# Patient Record
Sex: Male | Born: 1940 | Race: Black or African American | Hispanic: No | Smoking: Former smoker
Health system: Southern US, Community
[De-identification: ages and names within clinical notes are randomized; demographics above are authoritative.]

## PROBLEM LIST (undated history)

## (undated) DIAGNOSIS — S88119A Complete traumatic amputation at level between knee and ankle, unspecified lower leg, initial encounter: Secondary | ICD-10-CM

## (undated) DIAGNOSIS — E7800 Pure hypercholesterolemia, unspecified: Secondary | ICD-10-CM

## (undated) DIAGNOSIS — I639 Cerebral infarction, unspecified: Secondary | ICD-10-CM

## (undated) DIAGNOSIS — I10 Essential (primary) hypertension: Secondary | ICD-10-CM

## (undated) DIAGNOSIS — E669 Obesity, unspecified: Secondary | ICD-10-CM

## (undated) DIAGNOSIS — I2510 Atherosclerotic heart disease of native coronary artery without angina pectoris: Secondary | ICD-10-CM

## (undated) DIAGNOSIS — I739 Peripheral vascular disease, unspecified: Secondary | ICD-10-CM

## (undated) HISTORY — PX: AMPUTATION: SHX166

---

## 2001-02-22 ENCOUNTER — Encounter: Payer: Self-pay | Admitting: Internal Medicine

## 2001-02-22 ENCOUNTER — Inpatient Hospital Stay (HOSPITAL_COMMUNITY): Admission: EM | Admit: 2001-02-22 | Discharge: 2001-02-24 | Payer: Self-pay | Admitting: Emergency Medicine

## 2001-02-22 ENCOUNTER — Encounter: Payer: Self-pay | Admitting: Emergency Medicine

## 2001-02-23 ENCOUNTER — Encounter: Payer: Self-pay | Admitting: Internal Medicine

## 2003-07-18 ENCOUNTER — Emergency Department (HOSPITAL_COMMUNITY): Admission: EM | Admit: 2003-07-18 | Discharge: 2003-07-18 | Payer: Self-pay | Admitting: Emergency Medicine

## 2003-08-14 ENCOUNTER — Encounter (HOSPITAL_COMMUNITY): Admission: RE | Admit: 2003-08-14 | Discharge: 2003-08-15 | Payer: Self-pay | Admitting: Internal Medicine

## 2003-12-19 ENCOUNTER — Ambulatory Visit (HOSPITAL_COMMUNITY): Admission: RE | Admit: 2003-12-19 | Discharge: 2003-12-19 | Payer: Self-pay | Admitting: General Surgery

## 2004-03-23 ENCOUNTER — Emergency Department (HOSPITAL_COMMUNITY): Admission: EM | Admit: 2004-03-23 | Discharge: 2004-03-23 | Payer: Self-pay | Admitting: Emergency Medicine

## 2004-04-02 ENCOUNTER — Observation Stay (HOSPITAL_COMMUNITY): Admission: RE | Admit: 2004-04-02 | Discharge: 2004-04-03 | Payer: Self-pay | Admitting: Orthopedic Surgery

## 2004-04-02 ENCOUNTER — Ambulatory Visit: Payer: Self-pay | Admitting: Orthopedic Surgery

## 2004-04-10 ENCOUNTER — Ambulatory Visit: Payer: Self-pay | Admitting: Orthopedic Surgery

## 2004-04-14 ENCOUNTER — Ambulatory Visit: Payer: Self-pay | Admitting: Orthopedic Surgery

## 2004-05-12 ENCOUNTER — Ambulatory Visit: Payer: Self-pay | Admitting: Orthopedic Surgery

## 2004-05-12 ENCOUNTER — Inpatient Hospital Stay (HOSPITAL_COMMUNITY): Admission: AD | Admit: 2004-05-12 | Discharge: 2004-05-21 | Payer: Self-pay | Admitting: Orthopedic Surgery

## 2004-06-11 ENCOUNTER — Ambulatory Visit: Payer: Self-pay | Admitting: Orthopedic Surgery

## 2004-06-18 ENCOUNTER — Ambulatory Visit: Payer: Self-pay | Admitting: Orthopedic Surgery

## 2004-06-25 ENCOUNTER — Ambulatory Visit: Payer: Self-pay | Admitting: Orthopedic Surgery

## 2004-07-09 ENCOUNTER — Ambulatory Visit: Payer: Self-pay | Admitting: Orthopedic Surgery

## 2004-07-23 ENCOUNTER — Ambulatory Visit: Payer: Self-pay | Admitting: Orthopedic Surgery

## 2004-08-06 ENCOUNTER — Ambulatory Visit: Payer: Self-pay | Admitting: Orthopedic Surgery

## 2004-08-11 ENCOUNTER — Ambulatory Visit (HOSPITAL_COMMUNITY): Admission: RE | Admit: 2004-08-11 | Discharge: 2004-08-11 | Payer: Self-pay | Admitting: Orthopedic Surgery

## 2004-08-20 ENCOUNTER — Ambulatory Visit: Payer: Self-pay | Admitting: Orthopedic Surgery

## 2004-08-20 ENCOUNTER — Inpatient Hospital Stay (HOSPITAL_COMMUNITY): Admission: AD | Admit: 2004-08-20 | Discharge: 2004-08-27 | Payer: Self-pay | Admitting: Orthopedic Surgery

## 2004-08-27 ENCOUNTER — Ambulatory Visit: Payer: Self-pay | Admitting: Physical Medicine & Rehabilitation

## 2004-08-27 ENCOUNTER — Inpatient Hospital Stay (HOSPITAL_COMMUNITY)
Admission: RE | Admit: 2004-08-27 | Discharge: 2004-09-04 | Payer: Self-pay | Admitting: Physical Medicine & Rehabilitation

## 2004-09-25 ENCOUNTER — Emergency Department (HOSPITAL_COMMUNITY): Admission: EM | Admit: 2004-09-25 | Discharge: 2004-09-25 | Payer: Self-pay | Admitting: Emergency Medicine

## 2004-10-13 ENCOUNTER — Encounter
Admission: RE | Admit: 2004-10-13 | Discharge: 2005-01-11 | Payer: Self-pay | Admitting: Physical Medicine & Rehabilitation

## 2004-10-15 ENCOUNTER — Encounter
Admission: RE | Admit: 2004-10-15 | Discharge: 2004-10-15 | Payer: Self-pay | Admitting: Physical Medicine & Rehabilitation

## 2004-10-15 ENCOUNTER — Ambulatory Visit: Payer: Self-pay | Admitting: Physical Medicine & Rehabilitation

## 2004-12-09 ENCOUNTER — Encounter
Admission: RE | Admit: 2004-12-09 | Discharge: 2005-02-04 | Payer: Self-pay | Admitting: Physical Medicine & Rehabilitation

## 2004-12-29 ENCOUNTER — Ambulatory Visit: Payer: Self-pay | Admitting: Physical Medicine & Rehabilitation

## 2005-02-05 ENCOUNTER — Encounter
Admission: RE | Admit: 2005-02-05 | Discharge: 2005-03-03 | Payer: Self-pay | Admitting: Physical Medicine & Rehabilitation

## 2005-03-04 ENCOUNTER — Encounter
Admission: RE | Admit: 2005-03-04 | Discharge: 2005-05-05 | Payer: Self-pay | Admitting: Physical Medicine & Rehabilitation

## 2005-06-30 ENCOUNTER — Encounter
Admission: RE | Admit: 2005-06-30 | Discharge: 2005-08-27 | Payer: Self-pay | Admitting: Physical Medicine & Rehabilitation

## 2006-02-05 ENCOUNTER — Ambulatory Visit: Payer: Self-pay | Admitting: Internal Medicine

## 2006-03-05 ENCOUNTER — Ambulatory Visit: Payer: Self-pay | Admitting: Internal Medicine

## 2006-04-16 ENCOUNTER — Ambulatory Visit: Payer: Self-pay | Admitting: Internal Medicine

## 2006-04-26 DIAGNOSIS — I10 Essential (primary) hypertension: Secondary | ICD-10-CM | POA: Insufficient documentation

## 2006-04-26 DIAGNOSIS — Z8679 Personal history of other diseases of the circulatory system: Secondary | ICD-10-CM | POA: Insufficient documentation

## 2006-04-26 DIAGNOSIS — E785 Hyperlipidemia, unspecified: Secondary | ICD-10-CM

## 2006-04-26 DIAGNOSIS — N183 Chronic kidney disease, stage 3 (moderate): Secondary | ICD-10-CM

## 2006-04-26 DIAGNOSIS — D649 Anemia, unspecified: Secondary | ICD-10-CM

## 2006-05-07 ENCOUNTER — Ambulatory Visit: Payer: Self-pay | Admitting: Internal Medicine

## 2006-05-07 LAB — CONVERTED CEMR LAB: Hgb A1c MFr Bld: 5.9 %

## 2006-06-04 ENCOUNTER — Ambulatory Visit: Payer: Self-pay | Admitting: Internal Medicine

## 2006-07-02 ENCOUNTER — Ambulatory Visit: Payer: Self-pay | Admitting: Internal Medicine

## 2006-07-03 ENCOUNTER — Encounter (INDEPENDENT_AMBULATORY_CARE_PROVIDER_SITE_OTHER): Payer: Self-pay | Admitting: Internal Medicine

## 2006-07-03 LAB — CONVERTED CEMR LAB
ALT: <8 units/L (ref 0–53)
AST: 15 units/L (ref 0–37)
Albumin: 3.6 g/dL (ref 3.5–5.2)
Alkaline Phosphatase: 104 units/L (ref 39–117)
BUN: 32 mg/dL — ABNORMAL HIGH (ref 6–23)
Basophils Absolute: 0 10*3/uL (ref 0.0–0.1)
Basophils Relative: 0 % (ref 0–1)
CO2: 23 meq/L (ref 19–32)
Calcium: 8.3 mg/dL — ABNORMAL LOW (ref 8.4–10.5)
Chloride: 108 meq/L (ref 96–112)
Cholesterol: 152 mg/dL (ref 0–200)
Creatinine, Ser: 2.29 mg/dL — ABNORMAL HIGH (ref 0.40–1.50)
Eosinophils Absolute: 0.2 10*3/uL (ref 0.0–0.7)
Eosinophils Relative: 3 % (ref 0–5)
Glucose, Bld: 70 mg/dL (ref 70–99)
HCT: 34.6 % — ABNORMAL LOW (ref 39.0–52.0)
HDL: 36 mg/dL — ABNORMAL LOW (ref >39)
Hemoglobin: 11.1 g/dL — ABNORMAL LOW (ref 13.0–17.0)
LDL Cholesterol: 104 mg/dL — ABNORMAL HIGH (ref 0–99)
Lymphocytes Relative: 24 % (ref 12–46)
Lymphs Abs: 1.3 10*3/uL (ref 0.7–3.3)
MCHC: 32.1 g/dL (ref 30.0–36.0)
MCV: 68.1 fL — ABNORMAL LOW (ref 78.0–100.0)
Monocytes Absolute: 0.5 10*3/uL (ref 0.2–0.7)
Monocytes Relative: 9 % (ref 3–11)
Neutro Abs: 3.4 10*3/uL (ref 1.7–7.7)
Neutrophils Relative %: 63 % (ref 43–77)
Platelets: 266 10*3/uL (ref 150–400)
Potassium: 3.7 meq/L (ref 3.5–5.3)
RBC: 5.08 M/uL (ref 4.22–5.81)
RDW: 16.5 % — ABNORMAL HIGH (ref 11.5–14.0)
Sodium: 143 meq/L (ref 135–145)
Total Bilirubin: 0.3 mg/dL (ref 0.3–1.2)
Total CHOL/HDL Ratio: 4.2
Total Protein: 7 g/dL (ref 6.0–8.3)
Triglycerides: 60 mg/dL (ref <150)
VLDL: 12 mg/dL (ref 0–40)
WBC: 5.4 10*3/uL (ref 4.0–10.5)

## 2006-07-05 ENCOUNTER — Encounter (INDEPENDENT_AMBULATORY_CARE_PROVIDER_SITE_OTHER): Payer: Self-pay | Admitting: Internal Medicine

## 2006-07-07 LAB — CONVERTED CEMR LAB
Creatinine, Urine: 59.4 mg/dL
Microalb Creat Ratio: 480.1 mg/g — ABNORMAL HIGH (ref 0.0–30.0)
Microalb, Ur: 28.5 mg/dL — ABNORMAL HIGH (ref 0.00–1.89)

## 2006-07-08 ENCOUNTER — Encounter (INDEPENDENT_AMBULATORY_CARE_PROVIDER_SITE_OTHER): Payer: Self-pay | Admitting: Internal Medicine

## 2006-07-09 ENCOUNTER — Telehealth (INDEPENDENT_AMBULATORY_CARE_PROVIDER_SITE_OTHER): Payer: Self-pay | Admitting: Internal Medicine

## 2006-07-13 ENCOUNTER — Encounter (INDEPENDENT_AMBULATORY_CARE_PROVIDER_SITE_OTHER): Payer: Self-pay | Admitting: Internal Medicine

## 2006-07-13 ENCOUNTER — Ambulatory Visit (HOSPITAL_COMMUNITY): Admission: RE | Admit: 2006-07-13 | Discharge: 2006-07-13 | Payer: Self-pay | Admitting: Internal Medicine

## 2006-07-30 ENCOUNTER — Ambulatory Visit: Payer: Self-pay | Admitting: Internal Medicine

## 2006-07-30 DIAGNOSIS — N179 Acute kidney failure, unspecified: Secondary | ICD-10-CM

## 2006-07-30 LAB — CONVERTED CEMR LAB
Albumin: 3.3 g/dL — ABNORMAL LOW (ref 3.5–5.2)
Calcium: 8.8 mg/dL (ref 8.4–10.5)
Ferritin: 73 ng/mL (ref 22–322)
Glucose, Bld: 79 mg/dL (ref 70–99)
Iron: 43 ug/dL (ref 42–165)
Phosphorus: 3.6 mg/dL (ref 2.3–4.6)
Potassium: 3.3 meq/L — ABNORMAL LOW (ref 3.5–5.3)
Saturation Ratios: 15 % — ABNORMAL LOW (ref 20–55)
Sodium: 138 meq/L (ref 135–145)
UIBC: 235 ug/dL

## 2006-08-24 ENCOUNTER — Ambulatory Visit: Payer: Self-pay | Admitting: Internal Medicine

## 2006-08-24 DIAGNOSIS — E119 Type 2 diabetes mellitus without complications: Secondary | ICD-10-CM

## 2006-08-24 LAB — CONVERTED CEMR LAB: Hgb A1c MFr Bld: 6 %

## 2006-09-21 ENCOUNTER — Ambulatory Visit: Payer: Self-pay | Admitting: Internal Medicine

## 2006-09-22 ENCOUNTER — Telehealth (INDEPENDENT_AMBULATORY_CARE_PROVIDER_SITE_OTHER): Payer: Self-pay | Admitting: Internal Medicine

## 2006-09-22 ENCOUNTER — Ambulatory Visit: Payer: Self-pay | Admitting: Internal Medicine

## 2006-09-23 ENCOUNTER — Telehealth (INDEPENDENT_AMBULATORY_CARE_PROVIDER_SITE_OTHER): Payer: Self-pay | Admitting: *Deleted

## 2006-09-23 ENCOUNTER — Encounter (INDEPENDENT_AMBULATORY_CARE_PROVIDER_SITE_OTHER): Payer: Self-pay | Admitting: Internal Medicine

## 2006-09-24 ENCOUNTER — Encounter (INDEPENDENT_AMBULATORY_CARE_PROVIDER_SITE_OTHER): Payer: Self-pay | Admitting: Internal Medicine

## 2006-10-19 ENCOUNTER — Ambulatory Visit: Payer: Self-pay | Admitting: Internal Medicine

## 2006-10-20 LAB — CONVERTED CEMR LAB
CO2: 21 meq/L (ref 19–32)
Calcium: 8.9 mg/dL (ref 8.4–10.5)
Creatinine, Ser: 2.1 mg/dL — ABNORMAL HIGH (ref 0.40–1.50)
Sodium: 142 meq/L (ref 135–145)

## 2006-10-22 ENCOUNTER — Encounter (INDEPENDENT_AMBULATORY_CARE_PROVIDER_SITE_OTHER): Payer: Self-pay | Admitting: Internal Medicine

## 2006-11-18 ENCOUNTER — Ambulatory Visit: Payer: Self-pay | Admitting: Internal Medicine

## 2007-02-15 ENCOUNTER — Telehealth (INDEPENDENT_AMBULATORY_CARE_PROVIDER_SITE_OTHER): Payer: Self-pay | Admitting: *Deleted

## 2007-02-15 ENCOUNTER — Ambulatory Visit: Payer: Self-pay | Admitting: Internal Medicine

## 2007-02-15 DIAGNOSIS — H269 Unspecified cataract: Secondary | ICD-10-CM

## 2007-02-15 LAB — CONVERTED CEMR LAB
Blood Glucose, Fingerstick: 104
Hgb A1c MFr Bld: 5.6 %

## 2007-02-16 ENCOUNTER — Encounter (INDEPENDENT_AMBULATORY_CARE_PROVIDER_SITE_OTHER): Payer: Self-pay | Admitting: Internal Medicine

## 2007-02-17 LAB — CONVERTED CEMR LAB
AST: 13 units/L (ref 0–37)
Albumin: 3.7 g/dL (ref 3.5–5.2)
Alkaline Phosphatase: 102 units/L (ref 39–117)
BUN: 31 mg/dL — ABNORMAL HIGH (ref 6–23)
Calcium: 8.8 mg/dL (ref 8.4–10.5)
Chloride: 109 meq/L (ref 96–112)
Eosinophils Absolute: 0.2 10*3/uL (ref 0.0–0.7)
Glucose, Bld: 88 mg/dL (ref 70–99)
HDL: 32 mg/dL — ABNORMAL LOW (ref >39)
LDL Cholesterol: 86 mg/dL (ref 0–99)
Lymphocytes Relative: 25 % (ref 12–46)
Lymphs Abs: 1.4 10*3/uL (ref 0.7–3.3)
Monocytes Relative: 11 % (ref 3–11)
Neutro Abs: 3.4 10*3/uL (ref 1.7–7.7)
Neutrophils Relative %: 60 % (ref 43–77)
Platelets: 237 10*3/uL (ref 150–400)
Potassium: 3.8 meq/L (ref 3.5–5.3)
RBC: 5.05 M/uL (ref 4.22–5.81)
Sodium: 141 meq/L (ref 135–145)
Total Protein: 7 g/dL (ref 6.0–8.3)
Triglycerides: 76 mg/dL (ref <150)
WBC: 5.7 10*3/uL (ref 4.0–10.5)

## 2007-02-18 ENCOUNTER — Telehealth (INDEPENDENT_AMBULATORY_CARE_PROVIDER_SITE_OTHER): Payer: Self-pay | Admitting: *Deleted

## 2007-02-18 ENCOUNTER — Encounter (INDEPENDENT_AMBULATORY_CARE_PROVIDER_SITE_OTHER): Payer: Self-pay | Admitting: Internal Medicine

## 2007-02-21 ENCOUNTER — Encounter (INDEPENDENT_AMBULATORY_CARE_PROVIDER_SITE_OTHER): Payer: Self-pay | Admitting: Internal Medicine

## 2007-03-15 ENCOUNTER — Ambulatory Visit (HOSPITAL_COMMUNITY): Admission: RE | Admit: 2007-03-15 | Discharge: 2007-03-15 | Payer: Self-pay | Admitting: Ophthalmology

## 2007-03-18 ENCOUNTER — Encounter (INDEPENDENT_AMBULATORY_CARE_PROVIDER_SITE_OTHER): Payer: Self-pay | Admitting: Internal Medicine

## 2007-03-21 ENCOUNTER — Encounter (INDEPENDENT_AMBULATORY_CARE_PROVIDER_SITE_OTHER): Payer: Self-pay | Admitting: Internal Medicine

## 2007-05-17 ENCOUNTER — Ambulatory Visit: Payer: Self-pay | Admitting: Internal Medicine

## 2007-05-17 LAB — CONVERTED CEMR LAB: Blood Glucose, Fingerstick: 195

## 2007-05-18 ENCOUNTER — Telehealth (INDEPENDENT_AMBULATORY_CARE_PROVIDER_SITE_OTHER): Payer: Self-pay | Admitting: *Deleted

## 2007-05-18 ENCOUNTER — Encounter (INDEPENDENT_AMBULATORY_CARE_PROVIDER_SITE_OTHER): Payer: Self-pay | Admitting: Internal Medicine

## 2007-05-18 LAB — CONVERTED CEMR LAB
Calcium: 8.3 mg/dL — ABNORMAL LOW (ref 8.4–10.5)
Chloride: 109 meq/L (ref 96–112)
Phosphorus: 2.9 mg/dL (ref 2.3–4.6)
Potassium: 4.1 meq/L (ref 3.5–5.3)
Sodium: 142 meq/L (ref 135–145)

## 2007-08-15 ENCOUNTER — Ambulatory Visit: Payer: Self-pay | Admitting: Internal Medicine

## 2007-08-15 LAB — CONVERTED CEMR LAB: Hgb A1c MFr Bld: 7 %

## 2007-08-16 ENCOUNTER — Encounter (INDEPENDENT_AMBULATORY_CARE_PROVIDER_SITE_OTHER): Payer: Self-pay | Admitting: Internal Medicine

## 2007-08-16 ENCOUNTER — Telehealth (INDEPENDENT_AMBULATORY_CARE_PROVIDER_SITE_OTHER): Payer: Self-pay | Admitting: *Deleted

## 2007-08-16 LAB — CONVERTED CEMR LAB
Iron: 79 ug/dL (ref 42–165)
Saturation Ratios: 28 % (ref 20–55)
UIBC: 205 ug/dL

## 2007-08-17 ENCOUNTER — Telehealth (INDEPENDENT_AMBULATORY_CARE_PROVIDER_SITE_OTHER): Payer: Self-pay | Admitting: *Deleted

## 2007-08-17 LAB — CONVERTED CEMR LAB
ALT: <8 units/L (ref 0–53)
AST: 13 units/L (ref 0–37)
Albumin: 3.6 g/dL (ref 3.5–5.2)
Alkaline Phosphatase: 86 units/L (ref 39–117)
BUN: 31 mg/dL — ABNORMAL HIGH (ref 6–23)
Basophils Absolute: 0 10*3/uL (ref 0.0–0.1)
Basophils Relative: 1 % (ref 0–1)
Creatinine, Urine: 47.8 mg/dL
Eosinophils Absolute: 0.2 10*3/uL (ref 0.0–0.7)
HDL: 39 mg/dL — ABNORMAL LOW (ref >39)
LDL Cholesterol: 98 mg/dL (ref 0–99)
MCHC: 32.9 g/dL (ref 30.0–36.0)
MCV: 65.8 fL — ABNORMAL LOW (ref 78.0–100.0)
Microalb, Ur: 22.5 mg/dL — ABNORMAL HIGH (ref 0.00–1.89)
Neutro Abs: 2.8 10*3/uL (ref 1.7–7.7)
Neutrophils Relative %: 60 % (ref 43–77)
Platelets: 241 10*3/uL (ref 150–400)
Potassium: 4 meq/L (ref 3.5–5.3)
RDW: 15.5 % (ref 11.5–15.5)
Sodium: 139 meq/L (ref 135–145)
Total Protein: 7.4 g/dL (ref 6.0–8.3)

## 2007-09-01 ENCOUNTER — Encounter (INDEPENDENT_AMBULATORY_CARE_PROVIDER_SITE_OTHER): Payer: Self-pay | Admitting: Internal Medicine

## 2007-11-15 ENCOUNTER — Ambulatory Visit: Payer: Self-pay | Admitting: Internal Medicine

## 2007-11-15 DIAGNOSIS — R0609 Other forms of dyspnea: Secondary | ICD-10-CM

## 2007-11-15 DIAGNOSIS — R0989 Other specified symptoms and signs involving the circulatory and respiratory systems: Secondary | ICD-10-CM

## 2007-11-16 ENCOUNTER — Encounter (INDEPENDENT_AMBULATORY_CARE_PROVIDER_SITE_OTHER): Payer: Self-pay | Admitting: Internal Medicine

## 2007-11-17 LAB — CONVERTED CEMR LAB
Albumin: 3.6 g/dL (ref 3.5–5.2)
BUN: 26 mg/dL — ABNORMAL HIGH (ref 6–23)
Calcium: 8.7 mg/dL (ref 8.4–10.5)
Eosinophils Absolute: 0.2 10*3/uL (ref 0.0–0.7)
Eosinophils Relative: 4 % (ref 0–5)
Glucose, Bld: 97 mg/dL (ref 70–99)
HCT: 37.7 % — ABNORMAL LOW (ref 39.0–52.0)
Lymphs Abs: 1.2 10*3/uL (ref 0.7–4.0)
MCV: 69.8 fL — ABNORMAL LOW (ref 78.0–100.0)
Monocytes Relative: 6 % (ref 3–12)
Platelets: 198 10*3/uL (ref 150–400)
RBC: 5.4 M/uL (ref 4.22–5.81)
WBC: 5.3 10*3/uL (ref 4.0–10.5)

## 2007-12-26 ENCOUNTER — Telehealth (INDEPENDENT_AMBULATORY_CARE_PROVIDER_SITE_OTHER): Payer: Self-pay | Admitting: Internal Medicine

## 2008-01-04 ENCOUNTER — Encounter (INDEPENDENT_AMBULATORY_CARE_PROVIDER_SITE_OTHER): Payer: Self-pay | Admitting: Internal Medicine

## 2008-02-13 ENCOUNTER — Encounter (INDEPENDENT_AMBULATORY_CARE_PROVIDER_SITE_OTHER): Payer: Self-pay | Admitting: Internal Medicine

## 2008-02-14 ENCOUNTER — Ambulatory Visit: Payer: Self-pay | Admitting: Internal Medicine

## 2008-02-17 ENCOUNTER — Encounter (INDEPENDENT_AMBULATORY_CARE_PROVIDER_SITE_OTHER): Payer: Self-pay | Admitting: Internal Medicine

## 2008-03-14 ENCOUNTER — Ambulatory Visit: Payer: Self-pay | Admitting: Internal Medicine

## 2008-06-05 ENCOUNTER — Ambulatory Visit: Payer: Self-pay | Admitting: Internal Medicine

## 2008-06-05 DIAGNOSIS — R5381 Other malaise: Secondary | ICD-10-CM

## 2008-06-05 DIAGNOSIS — R5383 Other fatigue: Secondary | ICD-10-CM

## 2008-06-06 LAB — CONVERTED CEMR LAB
AST: 16 units/L (ref 0–37)
Alkaline Phosphatase: 94 units/L (ref 39–117)
BUN: 20 mg/dL (ref 6–23)
Basophils Relative: 0 % (ref 0–1)
Calcium: 9 mg/dL (ref 8.4–10.5)
Chloride: 108 meq/L (ref 96–112)
Creatinine, Ser: 1.6 mg/dL — ABNORMAL HIGH (ref 0.40–1.50)
Eosinophils Absolute: 0.2 10*3/uL (ref 0.0–0.7)
HDL: 40 mg/dL (ref >39)
Hemoglobin: 12.3 g/dL — ABNORMAL LOW (ref 13.0–17.0)
MCHC: 31.8 g/dL (ref 30.0–36.0)
MCV: 68.7 fL — ABNORMAL LOW (ref 78.0–100.0)
Monocytes Absolute: 0.4 10*3/uL (ref 0.1–1.0)
Monocytes Relative: 8 % (ref 3–12)
RBC: 5.63 M/uL (ref 4.22–5.81)
TSH: 3.67 microintl units/mL (ref 0.350–4.50)
Total CHOL/HDL Ratio: 3.6
Triglycerides: 57 mg/dL (ref <150)

## 2008-06-12 ENCOUNTER — Encounter (INDEPENDENT_AMBULATORY_CARE_PROVIDER_SITE_OTHER): Payer: Self-pay | Admitting: Internal Medicine

## 2008-08-06 ENCOUNTER — Encounter (INDEPENDENT_AMBULATORY_CARE_PROVIDER_SITE_OTHER): Payer: Self-pay | Admitting: Internal Medicine

## 2008-09-10 ENCOUNTER — Ambulatory Visit: Payer: Self-pay | Admitting: Internal Medicine

## 2008-09-11 ENCOUNTER — Encounter (INDEPENDENT_AMBULATORY_CARE_PROVIDER_SITE_OTHER): Payer: Self-pay | Admitting: Internal Medicine

## 2008-10-16 ENCOUNTER — Encounter (INDEPENDENT_AMBULATORY_CARE_PROVIDER_SITE_OTHER): Payer: Self-pay | Admitting: Internal Medicine

## 2008-10-22 ENCOUNTER — Telehealth (INDEPENDENT_AMBULATORY_CARE_PROVIDER_SITE_OTHER): Payer: Self-pay | Admitting: *Deleted

## 2008-11-06 ENCOUNTER — Ambulatory Visit (HOSPITAL_COMMUNITY): Admission: RE | Admit: 2008-11-06 | Discharge: 2008-11-06 | Payer: Self-pay | Admitting: Ophthalmology

## 2008-11-13 ENCOUNTER — Inpatient Hospital Stay (HOSPITAL_COMMUNITY): Admission: EM | Admit: 2008-11-13 | Discharge: 2008-11-20 | Payer: Self-pay | Admitting: Emergency Medicine

## 2008-11-16 ENCOUNTER — Encounter (INDEPENDENT_AMBULATORY_CARE_PROVIDER_SITE_OTHER): Payer: Self-pay | Admitting: *Deleted

## 2008-11-20 ENCOUNTER — Inpatient Hospital Stay: Admission: AD | Admit: 2008-11-20 | Discharge: 2008-12-12 | Payer: Self-pay | Admitting: Internal Medicine

## 2008-11-27 ENCOUNTER — Ambulatory Visit (HOSPITAL_COMMUNITY): Admission: RE | Admit: 2008-11-27 | Discharge: 2008-11-27 | Payer: Self-pay | Admitting: Orthopaedic Surgery

## 2008-12-10 ENCOUNTER — Telehealth (INDEPENDENT_AMBULATORY_CARE_PROVIDER_SITE_OTHER): Payer: Self-pay | Admitting: Internal Medicine

## 2008-12-11 ENCOUNTER — Ambulatory Visit (HOSPITAL_COMMUNITY): Admission: RE | Admit: 2008-12-11 | Discharge: 2008-12-11 | Payer: Self-pay | Admitting: Orthopaedic Surgery

## 2008-12-14 ENCOUNTER — Telehealth (INDEPENDENT_AMBULATORY_CARE_PROVIDER_SITE_OTHER): Payer: Self-pay | Admitting: Internal Medicine

## 2008-12-17 ENCOUNTER — Encounter (INDEPENDENT_AMBULATORY_CARE_PROVIDER_SITE_OTHER): Payer: Self-pay | Admitting: Internal Medicine

## 2008-12-20 ENCOUNTER — Ambulatory Visit: Payer: Self-pay | Admitting: Internal Medicine

## 2008-12-20 ENCOUNTER — Encounter (INDEPENDENT_AMBULATORY_CARE_PROVIDER_SITE_OTHER): Payer: Self-pay | Admitting: *Deleted

## 2008-12-22 ENCOUNTER — Encounter (INDEPENDENT_AMBULATORY_CARE_PROVIDER_SITE_OTHER): Payer: Self-pay | Admitting: Internal Medicine

## 2008-12-24 ENCOUNTER — Encounter (INDEPENDENT_AMBULATORY_CARE_PROVIDER_SITE_OTHER): Payer: Self-pay | Admitting: Internal Medicine

## 2008-12-25 LAB — CONVERTED CEMR LAB
BUN: 29 mg/dL — ABNORMAL HIGH (ref 6–23)
CO2: 22 meq/L (ref 19–32)
Chloride: 106 meq/L (ref 96–112)
Creatinine, Ser: 1.66 mg/dL — ABNORMAL HIGH (ref 0.40–1.50)
Eosinophils Absolute: 0.2 10*3/uL (ref 0.0–0.7)
Eosinophils Relative: 3 % (ref 0–5)
Glucose, Bld: 191 mg/dL — ABNORMAL HIGH (ref 70–99)
HCT: 32.3 % — ABNORMAL LOW (ref 39.0–52.0)
Hemoglobin: 10.5 g/dL — ABNORMAL LOW (ref 13.0–17.0)
Lymphs Abs: 1.1 10*3/uL (ref 0.7–4.0)
MCV: 67.7 fL — ABNORMAL LOW (ref 78.0–100.0)
Monocytes Absolute: 0.4 10*3/uL (ref 0.1–1.0)
Monocytes Relative: 5 % (ref 3–12)
Platelets: 236 10*3/uL (ref 150–400)
WBC: 7.3 10*3/uL (ref 4.0–10.5)

## 2009-01-04 ENCOUNTER — Encounter (INDEPENDENT_AMBULATORY_CARE_PROVIDER_SITE_OTHER): Payer: Self-pay | Admitting: Internal Medicine

## 2009-01-08 ENCOUNTER — Encounter (INDEPENDENT_AMBULATORY_CARE_PROVIDER_SITE_OTHER): Payer: Self-pay | Admitting: Internal Medicine

## 2009-02-07 ENCOUNTER — Encounter (INDEPENDENT_AMBULATORY_CARE_PROVIDER_SITE_OTHER): Payer: Self-pay | Admitting: Internal Medicine

## 2009-02-08 ENCOUNTER — Encounter (INDEPENDENT_AMBULATORY_CARE_PROVIDER_SITE_OTHER): Payer: Self-pay | Admitting: Internal Medicine

## 2009-05-14 ENCOUNTER — Ambulatory Visit (HOSPITAL_COMMUNITY): Admission: RE | Admit: 2009-05-14 | Discharge: 2009-05-14 | Payer: Self-pay | Admitting: Family Medicine

## 2010-06-16 ENCOUNTER — Ambulatory Visit (HOSPITAL_COMMUNITY)
Admission: RE | Admit: 2010-06-16 | Discharge: 2010-06-16 | Payer: Self-pay | Source: Home / Self Care | Attending: Family Medicine | Admitting: Family Medicine

## 2010-06-27 ENCOUNTER — Encounter (HOSPITAL_COMMUNITY)
Admission: RE | Admit: 2010-06-27 | Discharge: 2010-07-01 | Payer: Self-pay | Source: Home / Self Care | Attending: Family Medicine | Admitting: Family Medicine

## 2010-06-30 ENCOUNTER — Ambulatory Visit
Admission: RE | Admit: 2010-06-30 | Discharge: 2010-06-30 | Payer: Self-pay | Source: Home / Self Care | Attending: Surgery | Admitting: Surgery

## 2010-07-01 NOTE — Assessment & Plan Note (Signed)
OFFICE VISIT  Steven [name], Steven [name] DOB:  [birth year]                                       06/30/2010 [id number]#:[id number]  REASON FOR VISIT:  Right leg ulcer and swelling.  HISTORY:  This is a very pleasant 70 year old gentleman seen at the request of Dr. Sudie Bailey for right leg swelling and ulceration.  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  He has had problems with his right foot with swelling for approximately 8 months at the time of an ankle fracture.  He is currently getting an Unna boot to the right leg to help with the edema. He does not complain of any pain.  He walks with a limp on that side secondary to a stroke that he suffered in 2000.  The patient is medically managed for his diabetes.  He says his blood sugars run in the 70s.  He also suffers from hypertension and hypercholesterolemia which are medically managed.  REVIEW OF SYSTEMS:  GENERAL:  Negative for fevers, chills, weight gain or weight loss. VASCULAR:  Positive for a stroke and nonhealing wound. CARDIAC:  Negative for chest pain. GI:  Negative for blood in his stool. ENT:  Positive for recent change in eyesight. MUSCULOSKELETAL:  Negative for arthritis, joint and muscle pain. All other review of systems are negative as documented in the encounter form.  PAST MEDICAL HISTORY:  Diabetes, hypertension, hypercholesterolemia, history of stroke.  PAST SURGICAL HISTORY:  Left below-knee amputation.  SOCIAL HISTORY:  He does not smoke, quit 20 years ago.  Does not drink alcohol.  FAMILY HISTORY:  Positive for stroke in his mother and coronary artery disease in his father.  PHYSICAL EXAMINATION:  Heart rate is 67, blood pressure 188/76, O2 sat 94% on room air.  Temperature is 98.2. GENERAL:  He is well-appearing, in no distress. HEENT:  Within normal limits. LUNGS:  Clear bilaterally.  No wheezes or rhonchi. CARDIOVASCULAR:  Faintly palpable  bilateral femoral pulses. ABDOMEN:  Obese but soft. MUSCULOSKELETAL:  Marked right lower extremity edema with a 4 x 1 cm ulcer on the anterior medial side of his leg. SKIN:  Without rash.  DIAGNOSTICS:  The patient comes with an ultrasound today from [facility].  This is a very difficult study given his body habitus . Digital pressures were unable to be obtained secondary to the cuff not fitting and the calcified vessels in his feet.  He had monophasic waveforms in the right superficial femoral, popliteal, and dorsalis pedis suggesting possible inflow disease on the right.  ASSESSMENT/PLAN:  Right leg edema with ulceration.  PLAN:  The patient's disease most likely has arterial and venous component to it.  I think the first that we need to address the swelling.  This is best done with an Radio broadcast assistant which the patient has already treated with.  In addition, I am concerned that with active ulceration and possible arterial insufficiency that we need to further evaluate his blood flow.  I think this is going to be best done with formal angiogram.  I discussed this with the patient today.  I plan on proceeding with left groin access, studying his aorta and right leg and intervening as deemed necessary.  I did discuss the risk of embolization and the risk of bleeding with the patient.  This has been scheduled for Tuesday  February 7.    [name], MD Electronically Signed  VWB/MEDQ  D:  06/30/2010  T:  07/01/2010  Job:  [id number]

## 2010-07-04 ENCOUNTER — Ambulatory Visit (HOSPITAL_COMMUNITY): Payer: Self-pay

## 2010-07-08 ENCOUNTER — Observation Stay (HOSPITAL_COMMUNITY)
Admission: RE | Admit: 2010-07-08 | Discharge: 2010-07-09 | Disposition: A | Discharge status: Home Health | Payer: MEDICARE | Source: Ambulatory Visit | Attending: Surgery | Admitting: Surgery

## 2010-07-08 DIAGNOSIS — Z5309 Procedure and treatment not carried out because of other contraindication: Secondary | ICD-10-CM | POA: Insufficient documentation

## 2010-07-08 DIAGNOSIS — I739 Peripheral vascular disease, unspecified: Secondary | ICD-10-CM

## 2010-07-08 DIAGNOSIS — L97909 Non-pressure chronic ulcer of unspecified part of unspecified lower leg with unspecified severity: Secondary | ICD-10-CM | POA: Insufficient documentation

## 2010-07-08 DIAGNOSIS — L98499 Non-pressure chronic ulcer of skin of other sites with unspecified severity: Secondary | ICD-10-CM

## 2010-07-08 DIAGNOSIS — Z01812 Encounter for preprocedural laboratory examination: Secondary | ICD-10-CM | POA: Insufficient documentation

## 2010-07-08 DIAGNOSIS — Z01810 Encounter for preprocedural cardiovascular examination: Secondary | ICD-10-CM | POA: Insufficient documentation

## 2010-07-08 LAB — GLUCOSE, CAPILLARY
Glucose-Capillary: 89 mg/dL (ref 70–99)
Glucose-Capillary: 105 mg/dL — ABNORMAL HIGH (ref 70–99)
Glucose-Capillary: 116 mg/dL — ABNORMAL HIGH (ref 70–99)
Glucose-Capillary: 190 mg/dL — ABNORMAL HIGH (ref 70–99)

## 2010-07-08 LAB — POCT I-STAT, CHEM 8
BUN: 28 mg/dL — ABNORMAL HIGH (ref 6–23)
Chloride: 110 mEq/L (ref 96–112)
Glucose, Bld: 99 mg/dL (ref 70–99)
HCT: 38 % — ABNORMAL LOW (ref 39.0–52.0)
Potassium: 4.1 mEq/L (ref 3.5–5.1)

## 2010-07-09 LAB — BASIC METABOLIC PANEL
BUN: 23 mg/dL (ref 6–23)
Creatinine, Ser: 1.61 mg/dL — ABNORMAL HIGH (ref 0.4–1.5)
GFR calc non Af Amer: 43 mL/min — ABNORMAL LOW (ref >60)
Glucose, Bld: 111 mg/dL — ABNORMAL HIGH (ref 70–99)

## 2010-07-09 LAB — CBC
HCT: 34.2 % — ABNORMAL LOW (ref 39.0–52.0)
MCH: 21.8 pg — ABNORMAL LOW (ref 26.0–34.0)
MCV: 68.5 fL — ABNORMAL LOW (ref 78.0–100.0)
Platelets: 218 10*3/uL (ref 150–400)
RDW: 17.1 % — ABNORMAL HIGH (ref 11.5–15.5)

## 2010-07-09 LAB — GLUCOSE, CAPILLARY: Glucose-Capillary: 120 mg/dL — ABNORMAL HIGH (ref 70–99)

## 2010-07-13 NOTE — Op Note (Addendum)
NAME:  Steven [name], Steven [name]             ACCOUNT NO.:  [id number]  MEDICAL RECORD NO.:  [id number]           PATIENT TYPE:  I  LOCATION:  6529                         FACILITY:  [facility]  PHYSICIAN:  Juleen China IV, MDDATE OF BIRTH:  1940-10-26  DATE OF PROCEDURE:  07/08/2010 DATE OF DISCHARGE:                              OPERATIVE REPORT   SURGEON: 1. Durene Cal IV, MD  PREOPERATIVE DIAGNOSIS:  Right leg ulcer.  POSTOPERATIVE DIAGNOSIS:  Right leg ulcer.  PROCEDURE PERFORMED: 1. Ultrasound access left femoral artery. 2. Abdominal aortogram. 3. Right leg runoff. 4. Catheter in the anterior tibial artery (additional order     selection). 5. Selective angiogram, right anterior tibial artery. 6. Failed angioplasty, right anterior tibial artery.  INDICATIONS:  Mr. Inabinet is a 70 year old gentleman who has already undergone left below-knee amputation.  He presented to the office with swelling and an ulcer on his right leg.  Ultrasound identified monophasic waveforms, we could not get a cuff around his leg to get ABIs.  He comes today for  arteriogram and possible intervention.  PROCEDURE:  The patient was identified in the holding area and taken to room #8, placed supine on the table.  Left groin was prepped and draped in usual fashion.  The time-out was called.  The left femoral artery was evaluated with ultrasound, found to be widely patent, it was accessed under ultrasound guidance with an 18-gauge needle.  An 0.035 wire was advanced into the aorta under fluoroscopic visualization.  A 5-French sheath was placed.  Over the wire, an Omniflush catheter was advanced to the level of L1 and abdominal aortogram was obtained.  Next, using the Omniflush catheter and Bentson wire, the aortic bifurcation was crossed, the catheter was placed in the right external iliac artery and right leg runoff was obtained.  FINDINGS:  Aortogram:  The visualized portions of the  suprarenal abdominal aorta showed no significant disease.  There are single renal arteries bilaterally, which are widely patent.  The infrarenal abdominal aorta is widely patent.  Bilateral common iliac arteries are widely patent.  Bilateral hypogastric arteries are widely patent.  Bilateral external iliac arteries are widely patent.  Right lower extremity:  Right common femoral artery is calcified, but patent without stenosis.  The right profunda femoral artery is widely patent.  The right superficial femoral artery is calcified throughout its length.  There is approximately 50% stenosis in the distal superficial femoral artery at the level of the adductor canal.  There was also approximately 40-50% stenosis in the above-knee popliteal artery behind the patella.  The patient has severe tibial disease.  The peroneal and posterior tibial artery are occluded.  There is a focal high-grade stenosis/near total occlusion of the origin of the anterior tibial artery.  Plantar arch is not intact, but the anterior tibial artery does cross the ankle.  At this point in time, the decision was made to intervene.  A 7-French Ansel 1sheath was advanced into the right superficial femoral artery. The patient was given systemic heparinization.  This was monitored with ACT throughout the case and redosed as appropriate.  I then used an  0.018 Quick-Cross and 0.014 Spartacore wire to get down into the distal popliteal artery, I could not navigate the wire to the origin of the anterior tibial artery.  Therefore, the Quick-Cross catheter was removed and an 0.035 Berenstein I Catheter was used to help cannulate the origin of the anterior tibial artery.  At this point, a contrast injection was performed with the catheter in the anterior tibial artery, which better identify the stenosis.  There was diffuse disease in the origin of the anterior tibial artery, I had extreme difficult time controlling the tip of  the wire.  There was no torque ability.  The wire was kept on and wanted to select a collateral, there did appear to be small lumen within the anterior tibial artery.  I was unable to select it.  I then used a Cook 4-French directional catheter.  Again, I did not have any torque ability, this was placed through the 5-French catheter.  Again, minimal torque ability.  I tried multiple wires including a Cougar wire. Ultimately, I switched to a [employer] 12 g tipped wire.  This enable me to cross the stenosis, however, lead to most likely being outside the arterial lumen.  This created a dissection and after multiple failed attempts, I was unable to reenter into the native artery.  At this point, I did not feel further intervention was warranted at this time as the 0.014 catheter was unable to the torque within the 0.035 device.  So, I elected to determine the procedure at this time.  Catheters and wires were removed.  The catheter tip was pulled back to the external iliac artery and the patient was taken to holding area for sheath pull.  IMPRESSION: 1. 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  This was unable to be     successfully recanalized. 2. Incomplete plantar arch with diffuse disease out onto the foot.  If the patient does not able to heal his wound, we could consider repeat attempt at percutaneous revascularization.     [name], MD     VWB/MEDQ  D:  07/08/2010  T:  07/09/2010  Job:  [id number]  Electronically Signed by Arelia Longest IV MD on 07/13/2010 10:00:08 PM

## 2010-07-21 ENCOUNTER — Ambulatory Visit (INDEPENDENT_AMBULATORY_CARE_PROVIDER_SITE_OTHER): Payer: Medicare Other | Admitting: Surgery

## 2010-07-21 DIAGNOSIS — L98499 Non-pressure chronic ulcer of skin of other sites with unspecified severity: Secondary | ICD-10-CM

## 2010-07-21 DIAGNOSIS — I739 Peripheral vascular disease, unspecified: Secondary | ICD-10-CM

## 2010-07-21 NOTE — Assessment & Plan Note (Signed)
OFFICE VISIT  Steven [name], Steven [name] DOB:  September 02, 1940                                       07/21/2010 [id number]#:[id number]  REASON FOR VISIT:  Followup right leg ulcer.  HISTORY:  This is a 70 year old gentleman who has undergone left leg amputation who has developed left leg swelling with ulceration which has been going on for approximately 8 months.  He went for an arteriogram on 02/07.  At that time the patient was found to have severe tibial disease with all three tibial vessels being occluded.  I tried to recanalize the anterior tibial artery.  However, this was unsuccessful.  The patient was placed back into Profore.  He has been receiving twice weekly changes.  He is back today for followup.  PHYSICAL EXAM:  Vital signs:  Heart rate 63, blood pressure 162/82, temperature 98.0.  General:  He is well-appearing, in no distress. Lungs:  Respirations nonlabored.  Extremities:  Significant right leg edema.  The ulcerations have actually nearly healed.  There is no erythema, no drainage.  ASSESSMENT:  Right leg ulceration with severe swelling.  PLAN:  I think there is definitely an arterial insufficiency component to his inability to heal the wounds.  However, by today's evaluation his wounds have nearly healed.  I was rather surprised that this was the case.  I think that the compression has definitely helped this problem. I would still consider repeat attempt at recanalization of his anterior tibial artery.  However, I think since his wounds are improving we can hold off on this for now and have him come back to see me in 3 weeks for a wound check.  He is going to continue with biweekly placement of Profore.    [name], MD Electronically Signed  VWB/MEDQ  D:  07/21/2010  T:  07/21/2010  Job:  [id number]  cc:   Mila Homer. Sudie Bailey, M.D.

## 2010-07-31 ENCOUNTER — Encounter (HOSPITAL_BASED_OUTPATIENT_CLINIC_OR_DEPARTMENT_OTHER): Payer: Medicare Other | Attending: Internal Medicine

## 2010-07-31 DIAGNOSIS — Z7982 Long term (current) use of aspirin: Secondary | ICD-10-CM | POA: Insufficient documentation

## 2010-07-31 DIAGNOSIS — E669 Obesity, unspecified: Secondary | ICD-10-CM | POA: Insufficient documentation

## 2010-07-31 DIAGNOSIS — E785 Hyperlipidemia, unspecified: Secondary | ICD-10-CM | POA: Insufficient documentation

## 2010-07-31 DIAGNOSIS — Z8673 Personal history of transient ischemic attack (TIA), and cerebral infarction without residual deficits: Secondary | ICD-10-CM | POA: Insufficient documentation

## 2010-07-31 DIAGNOSIS — L97809 Non-pressure chronic ulcer of other part of unspecified lower leg with unspecified severity: Secondary | ICD-10-CM | POA: Insufficient documentation

## 2010-07-31 DIAGNOSIS — Z79899 Other long term (current) drug therapy: Secondary | ICD-10-CM | POA: Insufficient documentation

## 2010-07-31 DIAGNOSIS — E119 Type 2 diabetes mellitus without complications: Secondary | ICD-10-CM | POA: Insufficient documentation

## 2010-07-31 DIAGNOSIS — S88119A Complete traumatic amputation at level between knee and ankle, unspecified lower leg, initial encounter: Secondary | ICD-10-CM | POA: Insufficient documentation

## 2010-07-31 DIAGNOSIS — I70209 Unspecified atherosclerosis of native arteries of extremities, unspecified extremity: Secondary | ICD-10-CM | POA: Insufficient documentation

## 2010-08-11 ENCOUNTER — Ambulatory Visit: Payer: Medicare Other | Admitting: Surgery

## 2010-09-04 ENCOUNTER — Encounter (HOSPITAL_BASED_OUTPATIENT_CLINIC_OR_DEPARTMENT_OTHER): Payer: Medicare Other

## 2010-09-07 LAB — GLUCOSE, CAPILLARY
Glucose-Capillary: 117 mg/dL — ABNORMAL HIGH (ref 70–99)
Glucose-Capillary: 119 mg/dL — ABNORMAL HIGH (ref 70–99)
Glucose-Capillary: 122 mg/dL — ABNORMAL HIGH (ref 70–99)
Glucose-Capillary: 125 mg/dL — ABNORMAL HIGH (ref 70–99)
Glucose-Capillary: 132 mg/dL — ABNORMAL HIGH (ref 70–99)
Glucose-Capillary: 135 mg/dL — ABNORMAL HIGH (ref 70–99)
Glucose-Capillary: 145 mg/dL — ABNORMAL HIGH (ref 70–99)
Glucose-Capillary: 152 mg/dL — ABNORMAL HIGH (ref 70–99)
Glucose-Capillary: 171 mg/dL — ABNORMAL HIGH (ref 70–99)
Glucose-Capillary: 188 mg/dL — ABNORMAL HIGH (ref 70–99)
Glucose-Capillary: 200 mg/dL — ABNORMAL HIGH (ref 70–99)
Glucose-Capillary: 202 mg/dL — ABNORMAL HIGH (ref 70–99)
Glucose-Capillary: 206 mg/dL — ABNORMAL HIGH (ref 70–99)
Glucose-Capillary: 281 mg/dL — ABNORMAL HIGH (ref 70–99)
Glucose-Capillary: 290 mg/dL — ABNORMAL HIGH (ref 70–99)

## 2010-09-08 ENCOUNTER — Ambulatory Visit: Payer: Medicare Other | Admitting: Surgery

## 2010-09-08 LAB — COMPREHENSIVE METABOLIC PANEL
ALT: 8 U/L (ref 0–53)
ALT: <8 U/L (ref 0–53)
Albumin: 2.4 g/dL — ABNORMAL LOW (ref 3.5–5.2)
Alkaline Phosphatase: 61 U/L (ref 39–117)
BUN: 23 mg/dL (ref 6–23)
BUN: 32 mg/dL — ABNORMAL HIGH (ref 6–23)
CO2: 28 mEq/L (ref 19–32)
CO2: 28 mEq/L (ref 19–32)
CO2: 33 mEq/L — ABNORMAL HIGH (ref 19–32)
Calcium: 8.3 mg/dL — ABNORMAL LOW (ref 8.4–10.5)
Chloride: 102 mEq/L (ref 96–112)
Chloride: 104 mEq/L (ref 96–112)
Creatinine, Ser: 1.79 mg/dL — ABNORMAL HIGH (ref 0.4–1.5)
Creatinine, Ser: 2.16 mg/dL — ABNORMAL HIGH (ref 0.4–1.5)
GFR calc non Af Amer: 31 mL/min — ABNORMAL LOW (ref >60)
GFR calc non Af Amer: 38 mL/min — ABNORMAL LOW (ref >60)
GFR calc non Af Amer: 41 mL/min — ABNORMAL LOW (ref >60)
Glucose, Bld: 124 mg/dL — ABNORMAL HIGH (ref 70–99)
Glucose, Bld: 146 mg/dL — ABNORMAL HIGH (ref 70–99)
Potassium: 4 mEq/L (ref 3.5–5.1)
Sodium: 136 mEq/L (ref 135–145)
Sodium: 138 mEq/L (ref 135–145)
Total Bilirubin: 0.4 mg/dL (ref 0.3–1.2)
Total Bilirubin: 0.5 mg/dL (ref 0.3–1.2)
Total Protein: 5.7 g/dL — ABNORMAL LOW (ref 6.0–8.3)

## 2010-09-08 LAB — BLOOD GAS, ARTERIAL
FIO2: 21 %
pCO2 arterial: 41.2 mmHg (ref 35.0–45.0)
pH, Arterial: 7.419 (ref 7.350–7.450)

## 2010-09-08 LAB — GLUCOSE, CAPILLARY
Glucose-Capillary: 108 mg/dL — ABNORMAL HIGH (ref 70–99)
Glucose-Capillary: 118 mg/dL — ABNORMAL HIGH (ref 70–99)
Glucose-Capillary: 124 mg/dL — ABNORMAL HIGH (ref 70–99)
Glucose-Capillary: 132 mg/dL — ABNORMAL HIGH (ref 70–99)
Glucose-Capillary: 139 mg/dL — ABNORMAL HIGH (ref 70–99)
Glucose-Capillary: 141 mg/dL — ABNORMAL HIGH (ref 70–99)
Glucose-Capillary: 146 mg/dL — ABNORMAL HIGH (ref 70–99)
Glucose-Capillary: 148 mg/dL — ABNORMAL HIGH (ref 70–99)
Glucose-Capillary: 155 mg/dL — ABNORMAL HIGH (ref 70–99)
Glucose-Capillary: 168 mg/dL — ABNORMAL HIGH (ref 70–99)
Glucose-Capillary: 168 mg/dL — ABNORMAL HIGH (ref 70–99)
Glucose-Capillary: 183 mg/dL — ABNORMAL HIGH (ref 70–99)
Glucose-Capillary: 186 mg/dL — ABNORMAL HIGH (ref 70–99)
Glucose-Capillary: 212 mg/dL — ABNORMAL HIGH (ref 70–99)
Glucose-Capillary: 93 mg/dL (ref 70–99)
Glucose-Capillary: 98 mg/dL (ref 70–99)
Glucose-Capillary: 103 mg/dL — ABNORMAL HIGH (ref 70–99)
Glucose-Capillary: 172 mg/dL — ABNORMAL HIGH (ref 70–99)

## 2010-09-08 LAB — DIFFERENTIAL
Basophils Absolute: 0 10*3/uL (ref 0.0–0.1)
Basophils Absolute: 0 10*3/uL (ref 0.0–0.1)
Basophils Absolute: 0 10*3/uL (ref 0.0–0.1)
Basophils Absolute: 0.1 10*3/uL (ref 0.0–0.1)
Basophils Relative: 0 % (ref 0–1)
Basophils Relative: 0 % (ref 0–1)
Eosinophils Absolute: 0.2 10*3/uL (ref 0.0–0.7)
Eosinophils Absolute: 0.2 10*3/uL (ref 0.0–0.7)
Lymphocytes Relative: 12 % (ref 12–46)
Lymphocytes Relative: 16 % (ref 12–46)
Lymphocytes Relative: 15 % (ref 12–46)
Lymphs Abs: 1 10*3/uL (ref 0.7–4.0)
Monocytes Relative: 14 % — ABNORMAL HIGH (ref 3–12)
Monocytes Relative: 15 % — ABNORMAL HIGH (ref 3–12)
Monocytes Relative: 16 % — ABNORMAL HIGH (ref 3–12)
Neutro Abs: 4 10*3/uL (ref 1.7–7.7)
Neutro Abs: 4.4 10*3/uL (ref 1.7–7.7)
Neutro Abs: 4.4 10*3/uL (ref 1.7–7.7)
Neutro Abs: 5.5 10*3/uL (ref 1.7–7.7)
Neutro Abs: 5.5 10*3/uL (ref 1.7–7.7)
Neutrophils Relative %: 66 % (ref 43–77)
Neutrophils Relative %: 67 % (ref 43–77)
Neutrophils Relative %: 68 % (ref 43–77)
Neutrophils Relative %: 71 % (ref 43–77)

## 2010-09-08 LAB — CBC
HCT: 29.1 % — ABNORMAL LOW (ref 39.0–52.0)
HCT: 30.6 % — ABNORMAL LOW (ref 39.0–52.0)
HCT: 33.6 % — ABNORMAL LOW (ref 39.0–52.0)
Hemoglobin: 10.1 g/dL — ABNORMAL LOW (ref 13.0–17.0)
Hemoglobin: 11.1 g/dL — ABNORMAL LOW (ref 13.0–17.0)
Hemoglobin: 11.6 g/dL — ABNORMAL LOW (ref 13.0–17.0)
Hemoglobin: 12.5 g/dL — ABNORMAL LOW (ref 13.0–17.0)
MCHC: 32.9 g/dL (ref 30.0–36.0)
MCHC: 33.7 g/dL (ref 30.0–36.0)
MCV: 68.6 fL — ABNORMAL LOW (ref 78.0–100.0)
MCV: 69.8 fL — ABNORMAL LOW (ref 78.0–100.0)
Platelets: 164 10*3/uL (ref 150–400)
Platelets: 180 10*3/uL (ref 150–400)
Platelets: 183 10*3/uL (ref 150–400)
Platelets: 177 10*3/uL (ref 150–400)
RBC: 4.41 MIL/uL (ref 4.22–5.81)
RBC: 4.82 MIL/uL (ref 4.22–5.81)
RBC: 4.88 MIL/uL (ref 4.22–5.81)
RDW: 16.1 % — ABNORMAL HIGH (ref 11.5–15.5)
RDW: 16.7 % — ABNORMAL HIGH (ref 11.5–15.5)
RDW: 16.4 % — ABNORMAL HIGH (ref 11.5–15.5)
WBC: 6 10*3/uL (ref 4.0–10.5)
WBC: 6.3 10*3/uL (ref 4.0–10.5)
WBC: 7.4 10*3/uL (ref 4.0–10.5)

## 2010-09-08 LAB — URINALYSIS, ROUTINE W REFLEX MICROSCOPIC
Glucose, UA: NEGATIVE mg/dL
Leukocytes, UA: NEGATIVE
Protein, ur: 30 mg/dL — AB

## 2010-09-08 LAB — BASIC METABOLIC PANEL
BUN: 25 mg/dL — ABNORMAL HIGH (ref 6–23)
BUN: 32 mg/dL — ABNORMAL HIGH (ref 6–23)
BUN: 32 mg/dL — ABNORMAL HIGH (ref 6–23)
CO2: 29 mEq/L (ref 19–32)
CO2: 27 mEq/L (ref 19–32)
Calcium: 8.5 mg/dL (ref 8.4–10.5)
Calcium: 8.8 mg/dL (ref 8.4–10.5)
Calcium: 8.9 mg/dL (ref 8.4–10.5)
Calcium: 9 mg/dL (ref 8.4–10.5)
Chloride: 109 mEq/L (ref 96–112)
Creatinine, Ser: 1.8 mg/dL — ABNORMAL HIGH (ref 0.4–1.5)
GFR calc Af Amer: 46 mL/min — ABNORMAL LOW (ref >60)
GFR calc non Af Amer: 31 mL/min — ABNORMAL LOW (ref >60)
GFR calc non Af Amer: 31 mL/min — ABNORMAL LOW (ref >60)
Glucose, Bld: 143 mg/dL — ABNORMAL HIGH (ref 70–99)
Glucose, Bld: 111 mg/dL — ABNORMAL HIGH (ref 70–99)
Glucose, Bld: 97 mg/dL (ref 70–99)
Sodium: 141 mEq/L (ref 135–145)
Sodium: 141 mEq/L (ref 135–145)
Sodium: 142 mEq/L (ref 135–145)

## 2010-09-08 LAB — LIPID PANEL
Cholesterol: 112 mg/dL (ref 0–200)
HDL: 34 mg/dL — ABNORMAL LOW (ref >39)
Total CHOL/HDL Ratio: 3.3 RATIO
Triglycerides: 35 mg/dL (ref <150)
Triglycerides: 55 mg/dL (ref <150)
VLDL: 11 mg/dL (ref 0–40)

## 2010-09-08 LAB — IRON AND TIBC: Iron: <10 ug/dL — ABNORMAL LOW (ref 42–135)

## 2010-09-08 LAB — TSH
TSH: 1.669 u[IU]/mL (ref 0.350–4.500)
TSH: 1.78 u[IU]/mL (ref 0.350–4.500)

## 2010-09-08 LAB — HEMOGLOBIN A1C
Hgb A1c MFr Bld: 6.3 % — ABNORMAL HIGH (ref 4.6–6.1)
Mean Plasma Glucose: 134 mg/dL

## 2010-09-08 LAB — APTT: aPTT: 32 seconds (ref 24–37)

## 2010-09-08 LAB — URINE MICROSCOPIC-ADD ON

## 2010-09-08 LAB — FERRITIN: Ferritin: 102 ng/mL (ref 22–322)

## 2010-09-08 LAB — T4, FREE: Free T4: 0.93 ng/dL (ref 0.80–1.80)

## 2010-09-08 LAB — PROTIME-INR: Prothrombin Time: 14.3 seconds (ref 11.6–15.2)

## 2010-09-15 ENCOUNTER — Ambulatory Visit (INDEPENDENT_AMBULATORY_CARE_PROVIDER_SITE_OTHER): Payer: Medicare Other | Admitting: Surgery

## 2010-09-15 DIAGNOSIS — I70219 Atherosclerosis of native arteries of extremities with intermittent claudication, unspecified extremity: Secondary | ICD-10-CM

## 2010-09-16 NOTE — Assessment & Plan Note (Signed)
OFFICE VISIT  LESLIE, LANGILLE DOB:  10-16-1940                                       09/15/2010 EAVWU#:[id number]  He comes back today for followup.  This is a 70 year old gentleman who has had left leg amputated approximately 8 months ago.  He went for arteriogram for his right leg and was found to have severe tibial disease with all three tibial vessels occluded.  Anterior tibial recanalization was attempted, however, it was unsuccessful.  He has been in [facility] with twice weekly dressing changes for an ulcer on the lateral aspect of his wound.  He was sent to the wound center.  They have discharged him.  He is back today for followup.  PHYSICAL EXAMINATION:  Vital signs:  Heart rate is 73, blood pressure 175/87, temperature 98.0.  General:  He is resting comfortably. Cardiovascular:  Regular rate and rhythm.  Respirations:  Are nonlabored.  Extremities:  The right leg continues to be markedly edematous.  There are 2-3 punctate lesions on the lateral aspect of his lower right leg with a black eschar surrounding this area.  PLAN:  Ultimately once these wounds heal he will need to be put in compression stockings.  However, these will most likely need to be specially made given the size of his leg.  I discussed if this does not heal proceeding with a repeat arteriogram.  However, he is not interested in doing this.  I think he needs to be placed back in his Profore Lite until his wounds completely heal, otherwise if we cannot manage his swelling he is most likely going to end up with an amputation.  We are going to work on making sure he can get his Profore Lite covered by his insurance.  We will also want to see if his insurance can cover his compression stockings which most likely will need to be specially made for him.  He is going to come back to see me in 1 month.    [name], MD Electronically Signed  VWB/MEDQ  D:  09/15/2010   T:  09/16/2010  Job:  [id number]  cc:   Mila Homer. Sudie Bailey, M.D.

## 2010-10-14 NOTE — Consult Note (Signed)
NAME:  Steven [name], Steven [name]             ACCOUNT NO.:  [id number]   MEDICAL RECORD NO.:  [id number]          PATIENT TYPE:  INP   LOCATION:  A320                          FACILITY:  [facility]   PHYSICIAN:  Lonia Blood, M.D.      DATE OF BIRTH:  [birth year]   DATE OF CONSULTATION:  11/14/2008  DATE OF DISCHARGE:                                 CONSULTATION   REFERRING PHYSICIAN:  J. Darreld Mclean, M.D.   REASON FOR CONSULTATION:  Diabetic and hypertensive management.   HISTORY OF PRESENT ILLNESS:  The patient is a 70 year old gentleman with  longstanding history of diabetes, hypertension, etc., who slipped and  tripped at home and broke his right ankle.  He was brought to the  emergency room and was seen by Dr. Hilda Lias, who admitted him for  possible repair.  We are being consulted for medical management.  The  patient has had good control of his diabetes and hypertension at home.  He has continued to take his medications effectively.  He denied any  symptoms except for pain at this point.   PAST MEDICAL HISTORY:  1. Diabetes.  2. Hypertension.  3. Recent eye surgery by Dr. Nile Riggs.  4. Multiple previous legs surgeries.  5. Peripheral vascular disease, status post left below-the-knee      amputation from previous ischemic ulcers about 6 years ago.  6. History of anemia.  7. History of chronic kidney disease.  8. Dyslipidemia.  9. Remote history of alcohol abuse.   ALLERGIES:  NO KNOWN DRUG ALLERGIES.   MEDICATIONS:  Currently on sliding scale insulin, morphine, Benadryl, as  well as Zofran.  At home, he has apparently been on glipizide and a  number of other medicines, the patient is not sure of which ones.   SOCIAL HISTORY:  The patient lives at home.  He has a remote history of  alcohol abuse, but denied any tobacco use at the moment.  He denied any  IV drug use.  He sees Dr. Erle Crocker as his primary care physician.   FAMILY HISTORY:  Significant for diabetes, hypertension  and  dyslipidemia.   REVIEW OF SYSTEMS:  A 14 point review of systems is negative except per  HPI.   PHYSICAL EXAMINATION:  VITAL SIGNS:  Temperature 98.6, blood pressure  148/65, pulse 71, respiratory rate 20.  Saturations 98% on room air.  GENERAL:  He is awake, alert, obese man in no acute distress.  HEENT:  PERRL.  EOMI.  NECK:  Supple.  No visible JVD.  No significant lymphadenopathy.  RESPIRATORY:  The patient has good air entry bilaterally.  No wheezes,  no rales.  CARDIOVASCULAR:  He has S1-S2, no murmur.  ABDOMEN:  Obese, soft, nontender with positive bowel sounds.  EXTREMITIES:  Left lower extremity has below knee amputation.  On the  right, he has a swollen ankle, tender to touch, but no obvious color  change on the skin.   LABORATORY DATA:  His white count is 6.6, hemoglobin 11.6, platelet  count 183.  Sodium 141, potassium 3.6, chloride 109, CO2 of 30, glucose  143, BUN  32, creatinine 2.13 and calcium 8.8.  Urinalysis is essentially  negative.  His ABG showed a pH of 7.419, pCO2 of 41.2 and pO2 was 76  with saturation of 95% on room air.  Chest x-ray on admission was  stable, no acute cardiopulmonary process.  Right ankle x-ray showed  ankle fracture with destruction of the ankle, consistent with  trimalleolar fracture.   ASSESSMENT:  Therefore, this is a 70 year old gentleman admitted with  trimalleolar fracture on the right side who also has diabetes,  hypertension and dyslipidemia.  It appears as if the patient's medical  problem has remained stable.   RECOMMENDATIONS:  1. Keep the patient on sliding scale insulin for now.  Check      hemoglobin A1c.  Check his CBGs q.a.c. and nightly.  If his sugars      rise above 200, we will start long-acting insulin like Lantus.  2. Hypertension.  Again, his blood pressure is relatively controlled.      We will use probably beta-blocker in this setting, low dose as      needed.  ACE inhibitor is definitely indicated due to  his diabetic      nature, as well as chronic kidney disease, but we will do it under      caution to make sure we do not worsen his renal function.  3. Dyslipidemia.  We will check fasting lipid panel and depending on      whether or not the patient takes statin at home, we will resume      that.  If lipid panel is so abnormal, we will start him on statin.      Otherwise, the patient seems stable and we will follow up with you.      Thank you for the consult.      Lonia Blood, M.D.  Electronically Signed     LG/MEDQ  D:  11/15/2008  T:  11/15/2008  Job:  [id number]

## 2010-10-14 NOTE — Group Therapy Note (Signed)
NAME:  Steven [name], Steven [name]             ACCOUNT NO.:  [id number]   MEDICAL RECORD NO.:  [id number]          PATIENT TYPE:  INP   LOCATION:  A320                          FACILITY:  [facility]   PHYSICIAN:  Melissa L. Ladona Ridgel, MD  DATE OF BIRTH:  1940/08/24   DATE OF PROCEDURE:  11/19/2008  DATE OF DISCHARGE:                                 PROGRESS NOTE   Please note that the patient is sleeping in a chair, easily arousable.  He describes no new problems or pain.  He states he is getting better  with regard to his surgery and is starting to get around.   PHYSICAL EXAMINATION:  VITAL SIGNS:  His vital signs were reviewed and  are stable.  T-max of 99.8, temperature 98.8, blood pressure 138/73,  pulse 66, respirations 20, saturation 95%.  GENERAL:  This is a moderately obese African American male in no acute  distress.  HEENT:  He is normocephalic, atraumatic.  Pupils are equal and reactive  to light.  Extraocular muscles intact.  He has anicteric sclerae.  Mucous membranes are moist.  No oral lesions noted.  NECK:  Neck is supple.  There is no JVD.  No lymph nodes.  No carotid  bruits.  CHEST:  Chest is clear to auscultation with no rhonchi, rales or  wheezes.  CARDIOVASCULAR:  Regular rhythm.  Positive S1 and S2.  No S3 or S4.  No  murmurs, rubs or gallops.  ABDOMEN:  Soft, nontender, nondistended with  positive bowel sounds.  There is no hepatosplenomegaly noted,  at least  in the current body position.  EXTREMITIES: Lower extremities show trace edema.  NEUROLOGIC:  Neurologically the patient is sleepy but awakened easily.  Cranial nerves appear to be intact.   LABORATORY DATA:  White count was 6.2, hemoglobin 9.9, hematocrit 29.1  and platelets of 180,000.  His sodium is 136, potassium 4, chloride 102,  CO2 28, BUN of 32, creatinine was 2.6 which was up from 1.69 on 20th.  LFTs were within normal limits.   ASSESSMENT/PLAN:  This is a 70 year old African American male, status  post ankle  fracture with repair.  Course complicated by a chronic renal  insufficiency, hypertension and diabetes as well as peripheral vascular  disease.  1. Chronic renal insufficiency which appears to be worsening.  I will      discontinue his hydrochlorothiazide and ACE.  Will monitor his      blood pressure and other add other agents if necessary.  I will in      and out catheterize the patient if he is not urinating well.      Strict I's and O's and a daily weight will be requested.  2. Diabetes.  Blood sugars ranging from 148-190.  Will continue his      Actos and sliding scale insulin.  We may need to adjust further if      he continues to be slightly high.  3. Discharge planning will be per Dr. Hilda Lias.  4. Hypertension, currently stable.  Will monitor off his ACE      inhibitor.  If his  creatinine does not improve, I will need to have      renal to see him.   Total time in this case was of 30 minutes.      Melissa L. Ladona Ridgel, MD  Electronically Signed     MLT/MEDQ  D:  11/20/2008  T:  11/20/2008  Job:  [id number]

## 2010-10-14 NOTE — Discharge Summary (Signed)
NAME:  Steven [name], Steven [name]             ACCOUNT NO.:  [id number]   MEDICAL RECORD NO.:  [id number]          PATIENT TYPE:  INP   LOCATION:  A320                          FACILITY:  [facility]   PHYSICIAN:  J. Darreld Mclean, M.D. DATE OF BIRTH:  [birth year]   DATE OF ADMISSION:  11/13/2008  DATE OF DISCHARGE:  LH                               DISCHARGE SUMMARY   DISCHARGE DIAGNOSIS:  Trimalleolar fracture dislocation of the right  ankle.   OTHER DIAGNOSES:  1. Diabetes.  2. Hypertension.  3. Peripheral vascular disease.  4. Status post amputation left below-the-knee.  5. History of anemia.  6. History of chronic renal disease.  7. History of lipidemia.   DISCHARGE STATUS:  Prognosis good.   PHYSICAL THERAPY:  Patient is to attempt ambulation as tolerated as long  as he wears his left lower leg prosthesis.  __________ to toe-touch on  the right.  If unable to do, then do chair to bed, bed to chair type  transfers.  Elevate the right leg.   WOUND CARE:  Leave the cast on.  I will see him in the office on June 30  at 9:15 for removal of the cast and sutures and x-rays.   DISCHARGE MEDICATIONS:  1. Metoprolol 200 mg twice a day by mouth.  2. Lipitor 20 mg daily by mouth.  3. Plavix 75 mg a day by mouth.  4. Actos 30 mg a day by mouth.  5. Aspirin 81 mg a day by mouth.  6. Amlodipine  10 mg daily by mouth.  7. Doxazosin 4 mg at bedtime by mouth.  8. Vicodin ES 1 p.o. every 4 hours p.r.n. pain by mouth.  9. Enoxaparin 40 mg subcu for the next 14 days daily.  10.Nevanac  eye drops OD 3 times daily.  11.Omnipred eye drops OD 3 times daily.   The patient is to have diabetic urine checks.  A sliding scale may need  to be instituted.  That will depend on the physician at the nursing  home.   The patient's diet is to be an 1800-calorie ADA diet, low-salt.   BRIEF HISTORY:  The patient fell and sustained the above-mentioned  injury.  Was seen in the emergency room.  A dislocation was  reduced.  Was seen by the hospitalist for evaluation.  The patient had an elevated  BUN, blood sugar.  Hospitalist saw him.  Felt that he was at risk, but  acceptable risk for surgery.  He underwent his surgery on the second  postoperative day on the 18th, and he tolerated it well.  Postop he has  remained afebrile.  His pain has been controlled.  He had a PCA pump  originally.  Was seen by physical therapy, and he had had very poor  progress.  He initially wanted to go home and to go to his daughter's  house.  Now he is accepted to go to a skilled nursing facility.  The  patient's BUN and creatinine have been elevated.  Have slowly come down,  but he still has an elevation of his chronic renal disease.  The  patient  refused to get out of the chair.  He stays in the  geri-chair the whole  time and refused to get into the bed.  His pain was well-controlled.  He  was switched over to p.o. medications.  This was done on the 19th.  He  does well with transfers but needs maximum assistance.  The patient is a  large individual.  His blood sugars have been slightly elevated.  The  rest of his labs remained good.  He has remained afebrile.  His sed rate  has been normal.  Plans to go to the nursing home for continued care and  hopefully increase his ability to transfer.  I will see him in the  office as stated on the 30th at 9:15 in the morning.                                            ______________________________  J. Darreld Mclean, M.D.     JWK/MEDQ  D:  11/20/2008  T:  11/20/2008  Job:  [id number]

## 2010-10-14 NOTE — H&P (Signed)
NAME:  Steven [name], Steven [name]             ACCOUNT NO.:  [id number]   MEDICAL RECORD NO.:  [id number]          PATIENT TYPE:  INP   LOCATION:  IC01                          FACILITY:  [facility]   PHYSICIAN:  J. Darreld Mclean, M.D. DATE OF BIRTH:  [birth year]   DATE OF ADMISSION:  11/13/2008  DATE OF DISCHARGE:  LH                              HISTORY & PHYSICAL   CHIEF COMPLAINT:  I hurt my ankle.   The patient is a 70 year old male who slipped and tripped at home short  time ago and injured his right ankle.  He has trimalleolar fracture with  some lateral displacement and posterior displacement.  A posterior  malleolar fracture is a small area.  He does have a lateral shift to his  ankle.  No other injuries.   The patient has a long history of diabetes mellitus since 1993.  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  He has several operations  on his heel and ulcerations and then had below-the-knee amputation  approximately 5-6 years ago.   He had left eye surgery last Tuesday by Dr. Nile Riggs.   He denies any other surgeries.   He has a history of hypertension and has a history of diabetes mellitus,  on oral medicines controlled.  He has a history of stroke in  2000.  He  denies heart disease.  He denies lung disease.  He denies kidney  disease.  He denies GI problems.   He takes oral diabetes mellitus medicine and medicine for hypertension,  but he does not have his medicines with him.  He does not know the  actual medicines that he is taking.  Will have to bring those in.  He  denies any allergies.  His wife accompanies him.  He has a scooter at  home for his own use and lives here in [place].   Patient is alert, cooperative, and oriented.  HEENT:  Negative.  Left eye has some slight changes secondary to recent  surgery.  NECK:  Supple.  LUNGS:  Clear to P and A.  HEART:  Regular without murmur  heard.  ABDOMEN:  Soft, obese, nontender without masses.  EXTREMITIES:  Deformity right ankle with skin blister formation up and  down the skin from chronic brawny edema, has got edema of the foot.  It  has been like that for years.  He has a left below-the-knee amputation  with a prosthesis in place.  Deformity of the right ankle and upper  extremities are negative.  CNS:  Intact.  SKIN:  As stated brawny edema with chronic blisters, some changes on his  leg, thickened skin.   IMPRESSION:  1. Trimalleolar fracture of the right ankle with some displacement.  2. Diabetes mellitus, diet controlled.  3. Recent left eye surgery.  4. Hypertension.  5. Left below-the-knee amputation.   The patient is reluctant to have any surgery.  He said he would like to  be thoroughly checked.  I explained to both him and his wife with the  need for  surgery.  I have hospitalist see the patient and examination.  Plan to do surgery on Thursday to allow adequate time for further  diabetic workup as necessary.  The risks and imponderables was explained  including infection, pulmonary embolism, problems with his diabetes,  need for physical therapy, need to use a walker, and transfer techniques  on home health and home PT.  Explained anesthesia risk also.  I would  recommend spinal anesthesia.  The patient again wants to talk to the  hospitalists concerning his condition tomorrow.  Labs are pending.                                            ______________________________  J. Darreld Mclean, M.D.     JWK/MEDQ  D:  11/13/2008  T:  11/14/2008  Job:  [id number]

## 2010-10-14 NOTE — Op Note (Signed)
NAME:  Steven [name], Steven [name]             ACCOUNT NO.:  [id number]   MEDICAL RECORD NO.:  [id number]          PATIENT TYPE:  INP   LOCATION:  A320                          FACILITY:  [facility]   PHYSICIAN:  J. Darreld Mclean, M.D. DATE OF BIRTH:  Sep 17, 1940   DATE OF PROCEDURE:  11/15/2008  DATE OF DISCHARGE:                               OPERATIVE REPORT   PREOPERATIVE DIAGNOSIS:  Trimalleolar fracture of the ankle on the  right.   POSTOPERATIVE DIAGNOSIS:  Trimalleolar fracture of the ankle on the  right.   PROCEDURE:  Open treatment internal reduction of right ankle fracture  with fixation of the medial and lateral malleoli while the patient  supine application of posterior splint.   This is a stage procedure.  He had a dislocation posterior and that was  just reduced previously.   ANESTHESIA:  Spinal.   SURGEON:  J. Darreld Mclean, MD   TOURNIQUET TIME:  40 minutes.   No drains.  Posterior splint.   INDICATIONS:  The patient is a 70 year old male, who fell 2 days ago and  injured his right ankle late in the evening.  He had a posterior  dislocation and subluxation of the ankle which I reduced, putting him in  a posterior splint.  Surgery was delayed because he has a below-the-knee  amputation on the left.  He has got multiple medical problems including  diabetes mellitus and decreased sensation of the right foot.  One of the  hospitalist examined and evaluated him and make sure is the best medical  condition as possible.  The patient also had elevated BUN and  creatinine.  The patient has been evaluated, although his high-risk was  supple for surgery.   The patient's wife would be going out of town this coming week in for  planned out of town visit and will need to make arrangements for the  patient to go to his daughters house in [place], if the daughter is  unable to except him at [facility], then he will have to have a short-  term nursing home placement.  I spent sometime  to go on over the post  hospital plans with the family.   The patient is at high-risk for his surgery because of his history of  previous severe multiple medical problems in loss of left lower leg.  I  have talked about infection, possibly could loose his foot as well,  possibility of a diabetic reaction, pulmonary embolism which could lead  to death, need for physical therapy, need for limited ambulation,  problems lead to sensitive foot, and anesthesia risk among others.  He  and the family appeared to understand the procedure as outlined and  asked appropriate questions.   DESCRIPTION OF PROCEDURE:  The patient was seen in the holding area.  The left prosthesis had been previously removed.  The right leg was  identified as correct surgical site.  The right ankle, we placed a mark.  I placed a mark.  He is brought back to the operating room.  He was  given a spinal anesthesia.  Tourniquet placed deflated in the  right  upper thigh.  He was prepped and draped in the usual manner.  A  generalized time-out identifying, Steven [name], however, is the patient.  Identified the right ankle as correct surgical site.  All  instrumentation was deemed to be present and properly working.  The  patient had his leg elevated, wrapped circumferentially with an Esmarch  bandage.  Tourniquet inflated to 300 mmHg.  Esmarch bandage removed.  Incision made medially.  Medial malleolar fracture was identified and  reduced, held in place with a clamp.  A 0.062 smooth Kirschner wire was  placed and then a 3.2 drill was used and a 55-mm long malleolus screw  was inserted.  This fixed the lateral medial side anatomically and it  was verified with C-arm fluoroscopy unit.  Attention was directed to the  lateral side.  Fracture was identified, reduced, and held in place with  a 6-hole plate screw holes were placed.  X-rays looked good.  Screw  holes measured from 14-mm well locking screw to 22 for the cortical   screw.  X-rays looked good.  The fractures were reduced, was  reapproximated using 2-0 chromic, 2-0 plain, and skin staples.  A very,  very well-padded cast was applied.  We put him in multiple layers of  tipped cotton and ABDs.  Posterior splint was applied.  Once this was  all done, I cut extremities toenails, they were in poor condition and  very long and we cleaned between his toes.  He had very poor hygiene  around the toes area.  They looked good.  A good color to return back to  them after the tourniquet had been deflated after 40 minutes.  The  patient go to recovery and then be back to the floor.  Physical therapy  has been arranged, I will make arrangements for home health when he goes  to [facility] later this week or first of the next week.           ______________________________  J. Darreld Mclean, M.D.     JWK/MEDQ  D:  11/15/2008  T:  11/16/2008  Job:  [id number]

## 2010-10-17 NOTE — Procedures (Signed)
NAME:  Steven [name], Steven [name]                       ACCOUNT NO.:  [id number]   MEDICAL RECORD NO.:  [id number]                  PATIENT TYPE:  PREC   LOCATION:                                       FACILITY:  [facility]   PHYSICIAN:  Vida Roller, M.D.                DATE OF BIRTH:  February 20, 1941   DATE OF PROCEDURE:  08/14/2003  DATE OF DISCHARGE:                                    STRESS TEST   PROCEDURE:  Adenosine Cardiolite.   INDICATION:  [name] is a 70 year old gentleman with no known coronary  artery disease who presented for cardiac risk-factor stratification. His  cardiac factors included tobacco abuse, diabetes and hyperlipidemia.   BASELINE DATA:  EKG revealed sinus rhythm at 82 beats per minute with PAC's,  nonspecific ST abnormalities.  Blood pressure is elevated at 180/100.  Of  note, the patient did not take a.m. medications this morning.   Adenosine 60 mg was infused over four minute protocol with Cardiolite  injected at three minutes.  The patient denied any symptoms with infusion of  adenosine.  EKG revealed a few PAC's and no ischemic changes.  Blood  pressure decreased with infusion down to 140/82.   FINAL IMAGES AND RESULTS:  Pending M.D. review.     ________________________________________  ___________________________________________  Jae Dire, P.A. [facility]                      Vida Roller, M.D.   AB/MEDQ  D:  08/14/2003  T:  08/14/2003  Job:  [id number]

## 2010-10-17 NOTE — Discharge Summary (Signed)
NAME:  MIKI, BLANK NO.:  [id number]   MEDICAL RECORD NO.:  [id number]          PATIENT TYPE:  INP   LOCATION:  A307                          FACILITY:  [facility]   PHYSICIAN:  Vickki Hearing, M.D.DATE OF BIRTH:  1941/02/14   DATE OF ADMISSION:  08/20/2004  DATE OF DISCHARGE:  03/29/2006LH                                 DISCHARGE SUMMARY   ADMISSION DIAGNOSIS:  Gangrene of the left foot.   HISTORY:  This is a 70 year old male who injured his Achilles' tendon and  presented for Achilles' tendon repair.  He had an uncomplicated repair, but  postoperatively developed an infection and failure of the repair and was  treated with wound debridement and hardware removal during the recovery  period.  He placed a cream on his foot and it gave him a second degree burn.  He was treated with dressing changes and whirlpool, but developed gangrene.  During his workup, it was noted that he had poor blood supply to his lower  extremity with popliteal runoff for the three distal vessels.  His poor  vascularization caused the foot to develop a gangrene.   He was treated also during this hospitalization with a below-knee amputation  by Dirk Dress. Katrinka Blazing, M.D., assisted by Dr. Romeo Apple.  That went well.  He was  anemic preoperatively with a hemoglobin of 7.9 and was transfused.  The  hemoglobin at the time of discharge was 8.2.   The patient is being transferred to [facility].  His medications  will be Tenormin 100 mg q.12h., Glyburide 2.5 mg oral q.12h., Avapro 300 mg  oral daily, Actos 45 mg daily, Zocor 40 mg every night at 6 p.m., potassium  chloride 20 mEq b.i.d., Protonix 40 mEq oral b.i.d., iron 325 mg ferrous  sulfate b.i.d. and Tylox one every four hours p.r.n. for pain.   The patient should have q.8h. glucose checks.   He should have a consult with the [facility] for  prosthetic fitting.   He needs to have a vascular consult for  revascularization of his lower  extremities.   He needs to have a followup with Dr. Katrinka Blazing in one month.  He needs to have  his staples taken out on September 08, 2004.   Overall condition is improved.   DISPOSITION:  To [facility].      SEH/MEDQ  D:  08/26/2004  T:  08/26/2004  Job:  [id number]   cc:   Corrie Mckusick, M.D.  Fax: [phone]   Dirk Dress Katrinka Blazing, M.D.  [address]  [place]  [place] [address]  Fax: [phone]

## 2010-10-17 NOTE — Discharge Summary (Signed)
NAME:  Steven [name], Steven [name] NO.:  [id number]   MEDICAL RECORD NO.:  [id number]          PATIENT TYPE:  INP   LOCATION:                                FACILITY:  [facility]   PHYSICIAN:  Vickki Hearing, M.D.DATE OF BIRTH:  1940-11-19   DATE OF ADMISSION:  04/02/2004  DATE OF DISCHARGE:  11/03/2005LH                                 DISCHARGE SUMMARY   ADMITTING DIAGNOSIS:  Avulsion fracture left calcaneus/Achilles tendon.   DISCHARGE DIAGNOSIS:  Avulsion fracture left calcaneus/Achilles tendon.   OPERATIVE PROCEDURE:  On 11/02 he underwent internal fixation and repair of  avulsion fracture of the left Achilles and calcaneus and was admitted for  observation overnight for pain control, diabetic monitoring, and gait  training.   HISTORY:  He is 70 years old.  He stepped off a curb and injured the left  ankle and heel.  Radiographs showed a large, calcaneal avulsion fracture  with Achilles tendon attachment.  He was on Plavix.  He had to wait a week  for surgery.  Her this ProTimes and INRs were stabilized he underwent the  procedure as stated above.   On postop day 1 he underwent physical therapy and tolerated that well and  was discharged home.   DISCHARGE MEDICATIONS:  He was to resume all medications including Plavix.  He was discharged on Lorcet Plus 1 q.4h. p.r.n. for pain, a total number of  60, with 2 refills.   He was scheduled for a follow up visit in 1 week for casting and staple  removal.   DISPOSITION:  To home.   CONDITION:  Improved.   AMBULATORY STATUS:  Nonweightbearing on his left lower extremity.   ADDENDUM:  If he has trouble managing nonweightbearing he will need another  day of admission for gait training.     [name]   SEH/MEDQ  D:  04/03/2004  T:  04/03/2004  Job:  [id number]

## 2010-10-17 NOTE — Discharge Summary (Signed)
[facility]  Patient:    CREW, GOREN Visit Number: [id number] MRN: [id number]          Service Type: MED Location: 2 A212 01 Attending Physician:  Cassell Smiles. Dictated by:   Elfredia Nevins, M.D. Admit Date:  02/22/2001 Discharge Date: 02/24/2001                             Discharge Summary  DISCHARGE DIAGNOSIS:  Small vessel subcortical cerebrovascular accident.  SUMMARY:  The patient was admitted with slurred speech and unstable gait that happened during an acute onset dating probably 3 days prior to actual visitation to the hospital.  He had left-sided weakness and an unstable gait because of the left-sided weakness of his lower extremity.  It did respond spontaneously to resolve.   He had a possible syncopal episode.  History was difficult to obtain but apparently the day of emergency room visitation he developed recurrent left-sided weakness and slurred speech but it persisted longer.  He was noted to have a past medical history of insulin-dependent diabetes mellitus and hypertension as significant risk factors.  He was subsequently admitted after a CT revealed no hemorrhage or mass.  He was also admitted for telemetry due to the possible syncopal episode.  He was seen in consultation by Dr. Bertis Ruddy, of neurology who concurred with plavix treatment.  This was initiated on his admission.  He was also begun on losartan.  He had an uneventful course in the hospital and was subsequently discharged stable with better hypertension and diabetes mellitus control. Dictated by:   Elfredia Nevins, M.D. Attending Physician:  Cassell Smiles DD:  03/20/01 TD:  03/21/01 Job: [id number] XL/[id number]

## 2010-10-17 NOTE — Op Note (Signed)
NAME:  Steven [name], LEMAY NO.:  [id number]   MEDICAL RECORD NO.:  [id number]          PATIENT TYPE:  INP   LOCATION:  A331                          FACILITY:  [facility]   PHYSICIAN:  Vickki Hearing, M.D.DATE OF BIRTH:  August 18, 1940   DATE OF PROCEDURE:  DATE OF DISCHARGE:                                 OPERATIVE REPORT   PREOPERATIVE DIAGNOSIS:  Infection, postoperative wound, left ankle, status  post Achilles tendon repair.   POSTOPERATIVE DIAGNOSIS:  1.  Infection, postoperative wound, left ankle, status post Achilles tendon      repair.  2.  Additional re-rupture of Achilles tendon.   PROCEDURE:  Incision, drainage, irrigation and debridement, with removal of  hardware.   SURGEON:  Vickki Hearing, M.D.   There were no complications.  Counts were correct.   ANESTHESIA:  General.   HISTORY:  A 71 year old male, history of diabetes, noninsulin-dependent,  hypertension, who was injured October 22 secondary to a fall in his yard.  He underwent uncomplicated open treatment and internal fixation and repair  of avulsion fracture of left calcaneus with Achilles tendon functional  rupture on November 2.  He tolerated that well, was discharged home, and was  followed postoperatively.  He did well in the postoperative period until  approximately December 8.  Started having wound drainage and on December 12  presented to the office with a foul-smelling open draining wound.  He then  had three days of pulse lavage and wound care at the bedside and presented  today for formal irrigation, debridement, incision and drainage.   OPERATIVE FINDINGS:  Repair had completely failed, including the bone  fragment, which had migrated proximally into the leg.  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   We were able to obtain a good bleeding bed for wet-to-dry dressing changes.   The procedure was done as follows:  The patient was  identified in the  preoperative holding area as [name] and the operative site was  properly marked as left heel, ankle.  This was confirmed by the patient, by  the history and physical, and by the consent.  He was taken to surgery, had  general anesthetic, and a sterile prep and drape.  At that point a time-out  was taken as required, and all requirements of the time-out procedure were  completed, including proper identification of the patient and proper  extremity identification and location of the surgeon's initials over the  operative site.   The wound was debrided with sharp dissection.  The screw was removed.  The  tendon repair was noted to have failed.  The wound was irrigated and then  packed wet-to-dry and then the patient was extubated, taken to the recovery  room in stable condition.     [name]   SEH/MEDQ  D:  05/16/2004  T:  05/16/2004  Job:  [id number]

## 2010-10-17 NOTE — Assessment & Plan Note (Signed)
MEDICAL RECORD NUMBER:  [id number].   Mr. McKieverreturns to clinic today for followup evaluation accompanied by  his care givers. He is a 70 year old adult male with history of noninsulin-  dependent diabetes mellitus along with renal insufficiency, hypertension,  hyperlipidemia, and peripheral vascular disease. The patient had undergone a  left Achilles tendon repair along with calcaneal fracture repair November  2005. He underwent open reduction and internal fixation but developed  postoperative infection of his left ankle and reruptured his Achilles  tendon. He underwent irrigation and debridement along with removal of  hardware May 16, 2004 and was discharged home May 20, 2004.   The patient represented to [facility] in [place] August 20, 2004  with gangrenous changes of his left foot along with pressure ulcer of his  right heel. The patient was felt to be an appropriate candidate for knee  amputation secondary to a nonhealing gangrenous left foot. He underwent a  left below-knee amputation August 24, 2003 by Dr. Elpidio Anis.   Postoperatively, the patient was felt to be an appropriate candidate for  inpatient rehabilitation. He was moved to the rehabilitation unit August 27, 2004 and remained there through discharge September 04, 2004.   While in the rehabilitation unit, we did have Carolyne Fiscal from [facility] see  the patient and planned to see him in followup. No shrinker was obtained at  that point.   The patient eventually was discharged home and has been ambulatory using a  walker. He also has a power wheelchair at the home which he uses  periodically, but for the most part, he uses his walker. He reports that he  is anxious to be fitted with his definitive prosthesis. The patient has been  using an Ace wrap on his left lower extremity which is bulbous and requires  further adjustment because he could be fitted for his definitive prosthesis.   The patient is  independently at home although he does have assistance from  another individual for transportation, and that other individual also does  his grocery shopping, although the patient makes his own meals.   MEDICATIONS:  1.  Tenormin 100 mg q.12h.  2.  Avapro 300 mg q.d.  3.  Protonix 40 mg q.d.  4.  Zocor 40 mg q.d.  5.  Glyburide 2.5 mg q.12h.  6.  Actos 45 mg q.d.  7.  Multivitamin q.d.   ALLERGIES:  None.   PHYSICAL EXAMINATION:  Well appearing adult male in no acute distress. Blood  pressure 151/74 with pulse of 72, respiratory rate 16, and O2 saturation 98%  on room air. Examination of his left lower extremity shows skin to be intact  with the exception of a black eschar over the right lateral heel. No  discharge is present in the right lower extremity. He continues to be  treated by Dr. Katrinka Blazing in [place] for this problem. Dr. Katrinka Blazing presently has  him in a [facility] for his right lower extremity.   Examination of his left lower extremity shows a bulbous below-knee  amputation site. There was slight dryness of the skin. Strength was 5-/5  throughout the bilateral lower extremities. Bulk and tone were normal, and  reflexes were 2+ and symmetrical.   IMPRESSION:  1.  Status post left below-knee amputation August 20, 2004 for gangrenous      ankle/foot.  2.  History of calcaneal fracture and left Achilles tendon repair November      2005.  3.  Peripheral vascular disease.  4.  Noninsulin-dependent diabetes mellitus.  5.  Chronic renal insufficiency.  6.  Hypertension.  7.  Prior history of alcohol use.   At the present time, we will be setting the patient up for shrinker to be  applied on his left lower extremity. I unfortunately mixed up the patient in  terms of his prosthetist. I had asked Trey Paula from [facility] to see the patient  in the hospital, but then was working with Judy Pimple in the office today. We  will try to get that set up so that he gets back with Trey Paula at [facility] as  he  was the patient's prosthetist in the hospital. In any event, he will need a  shrinker on the left lower extremity and then eventually be fitted with a  definitive prosthesis. We will plan on seeing the patient in followup in  this office in approximately six to eight weeks time.      DC/MedQ  D:  10/15/2004 14:47:39  T:  10/15/2004 15:35:03  Job #:  [id number]

## 2010-10-17 NOTE — H&P (Signed)
NAME:  Steven [name], Steven [name] NO.:  [id number]   MEDICAL RECORD NO.:  [id number]          PATIENT TYPE:  IPS   LOCATION:  4009                         FACILITY:  [facility]   PHYSICIAN:  Ranelle Oyster, M.D.DATE OF BIRTH:  01/18/1941   DATE OF ADMISSION:  08/27/2004  DATE OF DISCHARGE:                                HISTORY & PHYSICAL   CHIEF COMPLAINT:  Left foot and leg pain status post left BKA.   HISTORY OF PRESENT ILLNESS:  This is a 70 year old black male with history  of diabetes who had difficulties with a torn Achilles tendon and calcaneal  fracture requiring ORIF in November 2005.  The patient had poor healing with  eventual removal of hardware in December 2005.  Was discharged home later  that month.  He presented back on March 22 to [facility] with a  gangrenous left foot.  Ultimately, the patient underwent a left below-knee  amputation on March 25.  The patient had postoperative anemia and was  transfused.  He was placed on Unasyn for infectious prophylaxis.  There is  some question of whether or not he needs revascularization of the right  lower extremity as well.  The patient was admitted for inpatient  comprehensive rehab.   REVIEW OF SYSTEMS:  Significant for reflux, low back pain, weakness, and  numbness.  The patient reports his left leg pain at 5/10 generally.  He has  some phantom pain noted.   PAST MEDICAL HISTORY:  1.  Non-insulin-dependent diabetes mellitus.  2.  Hypertension.  3.  Left Achilles tendon rupture.  4.  Calcaneus fracture.  5.  Hyperlipidemia.  6.  Chronic renal insufficiency.  7.  History positive for alcohol and tobacco use.   FAMILY HISTORY:  Significant for hypertension and diabetes.   SOCIAL HISTORY:  The patient lives alone in [place] without local family.  He does have electric wheelchair and walker.  Currently he is moderate  assistance for bed mobility.   MEDICATIONS PRIOR TO ADMISSION:  1.  Tenormin 100  mg b.i.d.  2.  Zocor 40 mg daily.  3.  Actos 45 mg daily.  4.  Avapro 200 mg daily.  5  Glyburide 2.5 mg q.12h.  1.  Protonix 40 mg daily.   ALLERGIES:  None.   LABORATORY DATA:  Hemoglobin 8.2.  Creatinine 2.1.  Hemoglobin A1C 5.9.  Recent white count was 12,000.   PHYSICAL EXAMINATION:  VITAL SIGNS:  Blood pressure 166/84, pulse 76,  temperature 98.4, respiratory rate 22.  GENERAL:  The patient is pleasant and in no acute distress.  He is obese.  HEENT:  Pupils equal, round, and reactive to light and accommodation.  Extraocular eye movements are intact.  NECK:  Supple without JVD or adenopathy.  CHEST:  Clear to auscultation bilaterally.  HEART:  Regular rate and rhythm without murmurs, rubs, or gallops.  ABDOMEN:  Soft, nontender.  EXTREMITIES:  Showed 1+ edema in right lower extremity with large area of  eschar over the right heel.  He had a padded sock on .  Left below-knee  amputation site was clean  and well approximated with minimal drainage.  Strength was 4+/5 in upper extremities, 4 to 4-/5 in the right lower  extremity.  Left lower extremity was 3+ to 4 with pain inhibition noted.  Leg remains tender to palpation.  Sensation was decreased distally in the  extremities.  NEUROLOGIC:  Cranial nerves II-XII intact.  DTRs were decreased.  The  patient's cognition was normal.  Affect was appropriate.   ASSESSMENT AND PLAN:  1.  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  Give inpatient      comprehensive rehabilitation.  Estimated length of stay is 10 days.      Prognosis is fair to good.  Goals are modified independent at the      wheelchair level.  2.  Pain management.  Continue oxycodone (IR) p.r.n.  Consider Neurontin for      phantom pain symptoms.  The patient was asked to notify the nurses if      his pain should increase.  3.  Right heel ulcer.  Continue heel padding and elevation.  4.  Non-insulin-requiring diabetes.  Check CBGs.  Continue on glyburide,       Actos, with sliding scale insulin.  5.  Anemia.  Follow up CBCs.  Will transfuse as needed.  6.  Chronic renal insufficiency.  Monitor creatinine and fluid status.  7.  Hypertension.  Continue Tenormin and Avapro.  8.  Peripheral vascular disease.  Consider CVTS followup for planning for      right lower extremity.      ZTS/MEDQ  D:  08/27/2004  T:  08/27/2004  Job:  [id number]

## 2010-10-17 NOTE — Discharge Summary (Addendum)
NAME:  Steven [name], Steven [name] NO.:  [id number]   MEDICAL RECORD NO.:  [id number]          PATIENT TYPE:  IPS   LOCATION:  4009                         FACILITY:  [facility]   PHYSICIAN:  Ellwood Dense, M.D.   DATE OF BIRTH:  03-Feb-1941   DATE OF ADMISSION:  08/27/2004  DATE OF DISCHARGE:  09/04/2004                                 DISCHARGE SUMMARY   DISCHARGE DIAGNOSES:  1.  Left below knee amputation on 03/22 secondary to gangrene.  2.  History of left Achilles' tendon calcaneal fracture with repair on 11/05      with postoperative infection and pain management.  3.  Right heel ulcer.  4.  Peripheral vascular disease.  5.  Non-insulin dependent diabetes mellitus.  6.  Anemia.  7.  Chronic renal insufficiency.  8.  Hyperlipidemia.  9.  Hypertension.  10. History of alcohol use.   HISTORY OF PRESENT ILLNESS:  This is a 70 year old, black male with a  history of diabetes mellitus, noted repair of left Achilles' tendon as well  as calcaneal fracture on 11/05 with open reduction and internal fixation,  developed postoperative infection in left ankle with additional re-rupture  of Achilles' tendon.  He underwent irrigation and debridement with removal  of hardware on 05/16/2004 and was discharged home 05/20/2004.  He presented  to [facility] on 03/22 with gangrenous changes of left foot as well  as pressure ulcer, right heel.  Segmental Doppler studies with advanced  peripheral vascular disease.  He underwent a left below knee amputation on  03/25 per Dr. Elpidio Anis.  Postoperative anemia, transfused.  He remained on intravenous Unasyn.  Blood  sugars were monitored, noted consult for inpatient rehabilitation services.   PAST MEDICAL HISTORY:  See discharge diagnoses.   SOCIAL HISTORY:  Occasional alcohol although chart notes abuse in the past.  Also tobacco use.  He lives in ____ QA MARKER: [address] ____ alone  family.  One level home, no steps to entry.   MEDICATIONS ON ADMISSION:  1.  Tenormin 100 mg twice a day.  2.  Zocor 40 mg daily.  3.  Actos 45 mg daily.  4.  Avapro 300 mg daily.  5.  Glyburide 2.5 mg twice a day.  6.  Protonix 40 mg daily.   ALLERGIES:  None.   HOSPITAL COURSE:  The patient with progressive gains on rehabilitation  services with therapy initiated on a b.i.d. basis.  The following issues  were followed during the patient's rehabilitation course:  Pertaining to the  left below knee amputation of 03/22 the surgical site is healing nicely,  staples in place, well approximated.  He will followup with Dr. Elpidio Anis  at [facility] in one to two weeks for removal of clips.  He was  advised to call if any increased redness, drainage or fever.  Arrangements  were made to followup in [facility].  Pain  management on-going with the use of Oxycodone.  Blood sugar monitored, 98,  71, 60 and he continued on diabetic  diet as well as Glyburide 2.5 mg q.12h.  with Actos 45 mg daily.  Blood pressure was controlled with diastolic  pressures 61 to 77.  He continued his Tenormin, Avapro.  He had no headache  or dizziness.  No bowel or bladder disturbance.   He was discharged home.  The latest functional status:  He was modified  independent for wheelchair, ambulating 75 feet, modified independent for  transfers, modified independent for car transfer.  Simple set-up for upper  and lower body bathing.  Modified independent dressings.  He was discharged  home with home therapy.   The latest laboratory studies showed testing for Clostridium difficile to be  negative.  His hemoglobin was 8.7, hematocrit 25.5, showed a generous  improvement from 8.1.  Sodium 139, potassium 3.7, BUN 20, creatinine 1.8.   DISCHARGE MEDICATIONS:  1.  Tenormin 100 mg q.12h.  2.  Avapro 300 mg daily.  3.  Protonix 40 mg daily.  4.  Zocor 40 mg daily.  5.  Trinsicon capsules twice a day.  6.  Glyburide 2.5 mg  q.12h.  7.  Actos 45 mg daily.  8.  Multivitamins daily.  9.  Oxycodone as needed for pain.   Activity is as tolerated.  Diet is diabetic.  Special instructions:  Home  health physical and occupational therapy.  The patient to followup at [facility] with Dr. Ellwood Dense. He should see Dr.  Elpidio Anis back in two weeks for removal of staples from the left below  knee amputation.      DA/MEDQ  D:  09/03/2004  T:  09/03/2004  Job:  [id number]   cc:   Corrie Mckusick, M.D.  Fax: [phone]   Dirk Dress Katrinka Blazing, M.D.  [address]  [place]  [place] [address]  Fax: [phone]

## 2010-10-17 NOTE — H&P (Signed)
NAME:  Steven [name], Steven [name] NO.:  [id number]   MEDICAL RECORD NO.:  [id number]          PATIENT TYPE:  INP   LOCATION:  A331                          FACILITY:  [facility]   PHYSICIAN:  Vickki Hearing, M.D.DATE OF BIRTH:  01-04-1941   DATE OF ADMISSION:  05/12/2004  DATE OF DISCHARGE:  LH                                HISTORY & PHYSICAL   CHIEF COMPLAINT:  Infection of the heel.   HISTORY:  Steven [name] is 70 years old with a history of diabetes, non-  insulin dependent, and hypertension, who was injured October 22 secondary to  a fall in his yard.  He underwent uncomplicated open treatment, internal  fixation, and repair of an avulsion fracture of his left calcaneus with  Achilles tendon functional rupture on November 2.  He tolerated that well  and was discharged home on November 3.  Did well in the postoperative period  but presented to the office on December 12 with a foul-smelling open  draining wound. He indicated that his would was cleaned approximately four  days ago and was fine, but today, when his friend/relative changed the  dressing, the wound was open, draining, and smelling of infection.  He was,  therefore, admitted.   FAMILY HISTORY:  Negative.   SOCIAL HISTORY:  He is a smoker, and he does use alcohol.  Educational  level: 12.   PAST MEDICAL AND SURGICAL HISTORY:  As stated.  In his record, there is  evidence of a procedure by Dr. Dorethea Clan.  He was worked up for anemia by Dr.  Lovell Sheehan on July 20.  He was scheduled to have a colonoscopy on December 19, 2003, no record of that.   MEDICATIONS:  1.  Benicar 40 b.i.d.  2.  Lipitor 20 mg q.p.m.  3.  Percocet 5 mg q.4h. p.r.n. pain.  4.  Morphine 4 mg IV q.2h. p.r.n. pain.   PHYSICAL EXAMINATION:  VITAL SIGNS:  Admitting temperature 99.1, pulse 77,  respiratory rate 20, CBG 123.  Blood pressure 145/79.  GENERAL:  Grossly benign.  NECK:  Supple.  CHEST:  Clear.  HEART:  Rate and rhythm normal.  ABDOMEN:  Soft.  EXTREMITIES:  His left heel wound was open, draining, and had a foul smell.  Functional integrity of the Achilles tendon palpably was intact; however,  this could not be proven at this time.  He has no sensation in the foot to  speak of.  He could not feel me directly palpating deep in the wound.  He  did not note that the wound was having a problem until the drainage and the  odor occurred.   IMPRESSION:  Infection, possible re-tear and separation of Achilles tendon  repair.   PLAN:  1.  Whirlpool treatment  2.  IV Unasyn.  3.  Irrigation and debridement if needed.  4.  Re-repair if needed.     Steven [name]   SEH/MEDQ  D:  05/12/2004  T:  05/12/2004  Job:  [id number]

## 2010-10-17 NOTE — Group Therapy Note (Signed)
NAME:  Steven [name], Steven [name] NO.:  [id number]   MEDICAL RECORD NO.:  [id number]          PATIENT TYPE:  INP   LOCATION:  A331                          FACILITY:  [facility]   PHYSICIAN:  Vickki Hearing, M.D.DATE OF BIRTH:  [birth year]   DATE OF PROCEDURE:  DATE OF DISCHARGE:                                   PROGRESS NOTE   PROBLEM LIST:  1.  Infection of left heel, status post Achilles tendon repair.  2.  Diabetes, non-insulin dependent.  3.  Hypertension.   The wound continues to look better.  However, the necrotic areas need to be  debrided surgically.  The patient will be preopped for surgery.  I have  discussed with him the possibility of amputation if his infection does not  clean up.  His repair is in question, and may need to be redone once the  infection clears.   He will have a chest x-ray today, EKG in preparation for surgery, CBC, sed  rate, and C-reactive protein.  Also an x-ray of his left ankle will be done,  and we will proceed from there.     [name]   SEH/MEDQ  D:  05/15/2004  T:  05/15/2004  Job:  [id number]

## 2010-10-17 NOTE — H&P (Signed)
NAME:  Steven [name], AVEY NO.:  [id number]   MEDICAL RECORD NO.:  [id number]          PATIENT TYPE:  INP   LOCATION:  A307                          FACILITY:  [facility]   PHYSICIAN:  Vickki Hearing, M.D.DATE OF BIRTH:  Aug 19, 1940   DATE OF ADMISSION:  08/20/2004  DATE OF DISCHARGE:  LH                                HISTORY & PHYSICAL   CHIEF COMPLAINT:  Diabetic left foot with infection.   HISTORY:  Steven [name] is 70 years old.  He injured his Achilles tendon,  presented for Achilles tendon repair, had uncomplicated repair but in the  postop period developed infection and failure of repair and was treated with  debridement of the wound.  During his recovery period from that, he placed  some type of cream and lotion on his foot and it gave him a second degree  burn.  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  This failed  because of lack of blood supply, and he is now presenting for definitive  management of the diabetic foot infection.   PHYSICAL EXAMINATION:  GENERAL:  Examination reveals a healthy-appearing  large black male, who is awake, alert and oriented x3.  EXTREMITIES:  Has no functional sensation in his left foot.  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  There is  swelling of the dorsum and plantar aspect of the foot.  There is a large  heel ulcer over the Achilles tendon.  There are weak pulses in the foot,  medium pulses in the popliteal space.  Good femoral pulse.  He has good  range of motion in his knee and hip, functional range of motion in his  ankle.  The upper extremities are normal.  CHEST:  Clear.  CARDIAC:  Heart rate and rhythm were normal.  ABDOMEN:  Soft, protuberant, with no masses.   Radiographs pending.  Initial white count was elevated.  Sedimentation rate  was 119.  Glucose pending.   PLAN:  IV  antibiotics, surgical consult, and we will determine the level of  amputation during the hospital stay.      SEH/MEDQ  D:  08/21/2004  T:  08/21/2004  Job:  [id number]

## 2010-10-17 NOTE — Group Therapy Note (Signed)
NAME:  Steven [name], Steven [name] NO.:  [id number]   MEDICAL RECORD NO.:  [id number]          PATIENT TYPE:  INP   LOCATION:  A331                          FACILITY:  [facility]   PHYSICIAN:  Vickki Hearing, M.D.DATE OF BIRTH:  [birth year]   DATE OF PROCEDURE:  DATE OF DISCHARGE:                                   PROGRESS NOTE   Patient has infection of his previously repaired Achilles' tendon with an  open wound.  He has diabetes.  Temperature max was 99.4.  CBG of 107 this  morning.  Blood pressure stable.  Recommend continue wound care.  Tomorrow  will make an assessment whether patient needs to have further surgery which  is most likely.     [name]   SEH/MEDQ  D:  05/14/2004  T:  05/14/2004  Job:  [id number]

## 2010-10-17 NOTE — Discharge Summary (Signed)
NAME:  Steven [name], Steven [name] NO.:  [id number]   MEDICAL RECORD NO.:  [id number]          PATIENT TYPE:  INP   LOCATION:  A331                          FACILITY:  [facility]   PHYSICIAN:  Vickki Hearing, M.D.DATE OF BIRTH:  1940-11-15   DATE OF ADMISSION:  05/12/2004  DATE OF DISCHARGE:  12/21/2005LH                                 DISCHARGE SUMMARY   ADMITTING DIAGNOSIS:  Infection, left heel.   DISCHARGING DIAGNOSIS:  Infection, left heel; re-rupture of Achilles tendon.   SECONDARY DIAGNOSES:  1.  Diabetes, noninsulin dependent.  2.  Hypertension.   HISTORY OF PRESENT ILLNESS:  Steven [name] is 70 years old.  On March 22, 2004, he injured his left ankle and heel secondary to a fall in his yard.  He subsequently underwent uncomplicated open treatment, internal fixation,  and repair of avulsion fracture of his left heel/calcaneus with functional  Achilles tendon rupture on April 02, 2004.  He tolerated that well, and  was discharged home on April 03, 2004.  He did well for approximately 4-  1/2 weeks, but on May 12, 2004, his wound opened up, and there was open  draining purulence with infection in the left heel.  He therefore underwent  re-operation for irrigation and debridement for infection.  At the time of  surgery, it was noted that his repair had failed.  Intraoperative cultures  were done on May 16, 2004 that showed no anaerobes, but did show Gram  negative rods consistent with Pseudomonas, sensitive to tobramycin,  imipenem, gentamicin, Cipro, ceftazidime, and two others.   Postoperatively, he underwent dressing changes with pulse lavage.  We  increased this to t.i.d. dressing changes, and once the wound maintained a  granulation bed, we cleared him for discharge.   LABORATORY DATA:  The initial laboratory studies revealed a white count of  11.4 on May 18, 2004.  Glucose was 71, BUN and creatinine 28 and 1.9.  Hemoglobin was 8.6.  On  May 19, 2004, white count was 13.1, neutrophil  count 74%.  Sedimentation rate 108.  C-reactive protein 14.5.   On May 20, 2004, after transfusion, hemoglobin was 10.1, white count  was 13.1.  BUN and creatinine remained 28 and 1.9.  Glucose was 150.   DISCHARGE INSTRUCTIONS FOR WOUND CARE:  T.I.D. dressing changes with moist-  to-day saline, wrap in gauze and Ace bandage.  Pulse lavage/whirlpool  treatment once a day.   MEDICATIONS:  1.  Tenormin 100 mg p.o. b.i.d.  2.  Glyburide 2.5 mg p.o. q.12h.  3.  Hydrochlorothiazide 12.5 mg p.o. daily.  4.  Avapro 300 mg p.o. daily.  5.  Protonix 40 mg p.o. b.i.d. __________.  6.  Actos 45 mg p.o. daily a.c.  7.  Zocor 40 mg p.o. daily in the evening.  8.  Dulcolax suppository 10 mg p.r.n.  9.  For pain, Tylox one p.o. q.4h. p.r.n.  10. Ceftazidime 1 gm q.12h. for 28 days starting on May 20, 2004,      running through June 18, 2004.   LABORATORY STUDIES:  CBC, sedimentation rate, and C-reactive protein  once  every 2 weeks starting on Monday.  Sent results to [phone], Dr. Mort Sawyers  office (that is the fax number).   DISCHARGE CONDITION:  Improved.  Still having trouble with ambulating on the  left lower extremity.  Should have no weight on the left leg.  He needs a  heel protector on his right leg.  He can ambulate with a walker and  assistance as needed, and he is allowed to go to the bathroom with a walker.   FOLLOW UP VISIT:  Dr. Mort Sawyers office in 3 weeks, on June 11, 2004,  which is a Wednesday, morning or afternoon.  Please call [phone] to  schedule an appointment.     [name]   SEH/MEDQ  D:  05/20/2004  T:  05/20/2004  Job:  [id number]

## 2010-10-17 NOTE — Group Therapy Note (Signed)
NAME:  Steven [name], Steven [name]             ACCOUNT NO.:  [id number]   MEDICAL RECORD NO.:  [id number]          PATIENT TYPE:  INP   LOCATION:  A331                          FACILITY:  [facility]   PHYSICIAN:  Vickki Hearing, M.D.DATE OF BIRTH:  [birth year]   DATE OF PROCEDURE:  DATE OF DISCHARGE:                                   PROGRESS NOTE   SUBJECTIVE:  Postoperative day #1, status post incision, drainage,  irrigation and debridement of left ankle, status post Achilles tendon  repair.   OBJECTIVE:  Temperature 99.2.  CBC 84, previous was 69.   ASSESSMENT:  Pain response 6.  We will decrease to 3 with medication.   PLAN:  Continue antibiotics with the Unasyn.  He has a CBC, sed rate and C-  reactive protein pending for Monday.  He has triple dressing changes daily.     [name]   SEH/MEDQ  D:  05/17/2004  T:  05/18/2004  Job:  [id number]

## 2010-10-17 NOTE — Op Note (Signed)
NAME:  Steven [name], Steven [name] NO.:  [id number]   MEDICAL RECORD NO.:  [id number]          PATIENT TYPE:  OBV   LOCATION:  A339                          FACILITY:  [facility]   PHYSICIAN:  Vickki Hearing, M.D.DATE OF BIRTH:  [birth year]   DATE OF PROCEDURE:  04/02/2004  DATE OF DISCHARGE:                                 OPERATIVE REPORT   PREOPERATIVE DIAGNOSIS:  Avulsion of the left Achilles tendon/calcaneus  fracture.   POSTOPERATIVE DIAGNOSIS:  Avulsion of the left Achilles tendon/calcaneus  fracture.   OPERATION PERFORMED:  Open reduction internal fixation of left Achilles  tendon avulsion/calcaneus fracture.   SURGEON:  Vickki Hearing, M.D.   ANESTHESIA:  Spinal.   TOURNIQUET TIME:  43 minutes.   OPERATIVE FINDINGS:  The Achilles tendon was avulsed from the calcaneus at  its superior aspect at the tuberosity.  There was also a Haglund's deformity  which was a separate fragment.  This was excised.   INDICATIONS FOR PROCEDURE:  This 70 year old male stepped of a curb, injured  the left ankle, complained of pain.  Radiographs showed an avulsion of the  Achilles with proximal migration of the fragment of bone.  He was on Plavix  and surgery was delayed one week.   DESCRIPTION OF PROCEDURE:  Mr. Bribiesca was identified in the preop holding  area.  He marked his left ankle as the operative site.  I signed my initials  there.  His antibiotics were given during the procedure.  He was taken to  the operating room for spinal anesthesia, placed supine on the operating  table, then flipped prone with proper padding.  His left ankle and leg were  prepped and draped in the usual sterile technique.  Mandatory time out was  taken to confirm the procedure, extremity and patient identity.  At that  time it was noted that his antibiotics had not been given and they were  given just after tourniquet elevation.   The tourniquet was elevated to 300 mmHg.  An incision was  made posteriorly  on the leg extended across the calcaneus and curved medially.  The  subcutaneous tissue was divided.  Approximately 2.5 cm proximal to the  attachment site, the proximal bone fragment and Achilles tendon attached to  it were encountered.  A large hematoma was evacuated.  The wound was  irrigated.  A cannulated screw from the 65 [employer] cannulated screw set was  used to pin the fragment back in place.  A holding suture and traction  suture were placed as well to hold the fragment in place.  The pin was  overdrilled with a drill bit and a 65 screw and washer were placed to secure  the fragment.  The Haglund's deformity was removed.  A second suture was  taken through the calcaneus and wrapped and weaved through the tendon and  tied proximally to take tension off the screw and washer and provide  additional fixation.  The wound was again irrigated and closed with 2-0  Biosyn and staples.  The patient was placed in a plantar flexion splint.  The tourniquet was  released.  The patient was taken back to the recovery  room after turning him supine in  stable condition.  His postoperative plan is nonweightbearing for six weeks  then he can come out of the cast which he will go into and he can do partial  weightbearing at that time and then have progressive dorsiflexion and  stretching exercises as tolerated.     [name]   SEH/MEDQ  D:  04/02/2004  T:  04/03/2004  Job:  [id number]

## 2010-10-17 NOTE — H&P (Signed)
NAME:  Steven [name], Steven [name]                       ACCOUNT NO.:  [id number]   MEDICAL RECORD NO.:  [id number]                   PATIENT TYPE:  EMS   LOCATION:  ED                                   FACILITY:  [facility]   PHYSICIAN:  Dalia Heading, M.D.               DATE OF BIRTH:  1940/07/18   DATE OF ADMISSION:  12/19/2003  DATE OF DISCHARGE:                                HISTORY & PHYSICAL   CHIEF COMPLAINT:  Anemia of unknown etiology.   HISTORY OF PRESENT ILLNESS:  The patient is a 70 year old black male who is  referred for endoscopic evaluation.  He needs a colonoscopy for workup of  anemia of unknown etiology.  He has no abdominal pain, weight loss, nausea,  vomiting, diarrhea, constipation, melena or hematochezia.  He has never had  a colonoscopy.  There is no family history of colon carcinoma.   PAST MEDICAL HISTORY:  1. Hypertension.  2. Non-insulin dependent diabetes.   PAST SURGICAL HISTORY:  Unremarkable.   CURRENT MEDICATIONS:  1. Actos 45 mg p.o. daily.  2. Aspirin one tablet p.o. daily.  3. Glucovance 2.5/500 mg p.o. b.i.d.  4. Lipitor 20 mg p.o. q.p.m.  5. Benicar 40/12.5 mg p.o. q.a.m.  6. Altace 5 mg p.o. daily.  7. Metoprolol 100 mg p.o. b.i.d.   ALLERGIES:  No known drug allergies.   REVIEW OF SYMPTOMS:  Noncontributory.   PHYSICAL EXAMINATION:  GENERAL:  The patient is a well developed, well  nourished black male in no acute distress.  VITAL SIGNS:  He is afebrile and vital signs are stable.  LUNGS:  Clear to auscultation with equal breath sounds bilaterally.  CARDIAC:  Heart examination reveals a regular rate and rhythm without S3, S4  or murmurs.  ABDOMEN:  Soft, non-tender and non-distended.  No hepatosplenomegaly or  masses are noted.  RECTAL EXAM:  Deferred to the procedure.   IMPRESSION:  1. Anemia of unknown etiology.   PLAN:  The patient is scheduled for a colonoscopy on December 19, 2003. The  risks and benefits of the procedure including  bleeding and perforation were  fully explained to the patient who gave informed consent.     ___________________________________________                                         Dalia Heading, M.D.   MAJ/MEDQ  D:  12/13/2003  T:  12/13/2003  Job:  [id number]   cc:   Corrie Mckusick, M.D.  [address] Laurell Josephs. A  [place]  [place] [address]  Fax: [phone]

## 2010-10-17 NOTE — Consult Note (Signed)
NAME:  Steven [name], CHRETIEN NO.:  [id number]   MEDICAL RECORD NO.:  [id number]          PATIENT TYPE:  INP   LOCATION:  A307                          FACILITY:  [facility]   PHYSICIAN:  Dirk Dress. Katrinka Blazing, M.D.   DATE OF BIRTH:  December 30, 1940   DATE OF CONSULTATION:  DATE OF DISCHARGE:                                   CONSULTATION   CONSULTING PHYSICIAN:  Dirk Dress. Katrinka Blazing, M.D.   A 70 year old male admitted by Dr. 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  His  history dates back to, March 22, 2004, when he fell and suffered an  evulsion fracture of his left calcaneus with Achilles tendon rupture.  This  was repaired on 02 April 2004.  He did well in the postoperative period  but when seen in the office he was noted to have a foul smelling, open,  draining wound.  He was admitted, on 12 May 2004, by Dr. Romeo Apple and  had an open drainage of the wound with debridement.  He had failure of the  repair and culture grew out gram negative rods consistent with Pseudomonas.  He was treated with dressing changes and post lavage for about nine days.  He continued to have problems since that time.  He has been followed by Dr.  Romeo Apple in the clinic over the past three months.  He presented to the  office on the day of admission, he was noted to have autonecrosis of the  second, third, and fourth digits of the left foot.  Because of the ischemia  of his foot, it was felt that he needed hospitalization for definitive care.   PAST HISTORY:  1.  He is a non-insulin-dependent diabetic who has been essentially      noncompliant.  2.  There is no evidence that his peripheral vascular disease has been      evaluated.  3.  He has a history of hypertension.  4.  By review of his chart, he had a transient CVA, in September 2002, with      left sided  weakness and slurred speech.  Carotid Doppler studies at that      time revealed some plaquing but no obstruction.  Followup CT done, in      February 2005, showed an old left __________ lacunar infarct.  The      patient also had a Cardiolite stress test, in March 2005.  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      There is no history to suggest myocardial infarction.   He has no known drug allergies.   MEDICATIONS:  1.  Tenormin 100 mg b.i.d.  2.  Zocor 40 mg every day.  3.  Actos 45 mg every day.  4.  Avapro 300 mg every day.  5.  Glyburide 2.5 mg q.12h.  6.  Protonix 40 mg every day.   SOCIAL HISTORY:  He is disabled.  He has a history of alcohol and heavy  tobacco use and uses them on a consistent basis.   PHYSICAL EXAMINATION:  GENERAL:  He is an obese male who is very quiet and  somewhat docile.  He does not appear to be in any acute distress.  VITAL SIGNS:  Blood pressure 156/85, pulse 86, respirations 20, weight 97.9,  O2 saturation 100% on room air, weight 242 pounds.  HEENT:  Unremarkable except for poor dentition.  NECK:  Supple.  There is no JVD, bruit, adenopathy, or thyromegaly.  CHEST:  Clear to auscultation.  No rales, rubs, rhonchi, or wheezes.  HEART:  Regular rate and rhythm without murmur, gallop or rub.  ABDOMEN:  Obese but soft.  No masses.  Normoactive bowel sounds.  EXTREMITIES:  Upper extremities are normal with good pulses.  Lower  extremities reveal bilateral decreased sensation in his feet confined to the  distal one half of both feet, more prominent on the plantar surface.  He has  gangrenous changes with black second, third and fourth toes with necrosis of  the lateral half of the __________ with some extension of the infectious  process in the subcutaneous of the distal foot.  He has a large ulcer which  shows some granulation  tissue of the left heel without evidence of  infection.  He has palpable femoral pulses with markedly decreased popliteal  pulses bilaterally and absent dorsalis pedis and posterior pulses  bilaterally.  He has dorsal and plantar flexion of his left foot.  He has  retained range of motion of his knee and hip bilaterally.  There is a grade  II pressure ulceration of the left heel with a thick black eschar but no  purulence.  There is no tenderness of the left heel compatible with his  decreased sensation.   IMPRESSION:  1.  Severe diabetic peripheral vascular disease bilateral lower extremities      with gangrenous necrosis of the first, second, third, and fourth toes of      the left foot with extension into the deep plantar fascia of the left      foot.  2.  Pressure ulceration of the right heel without evidence of infection.  3.  Poorly healed infected operative site, left heel with failed calcaneal      fracture repair and loss of Achilles function left heel.  4.  Diabetes mellitus, uncontrolled, progressive.  5.  Hypertension, uncontrolled.  6.  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  7.  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   RECOMMENDATIONS:  1.  The patient was seen on the day of admission and studies have been      ordered.  The results of these tests suggest that he has advanced      disease of his lower extremities.  Segmental Doppler studies show an      ankle brachial index of 0.65 on the left with non compressible calcified      vessels on the right.  He has a pressure gradient at the superficial      femoral artery bilaterally with Doppler suggesting greater than 50%      stenosis in the left superficial femoral artery and the right popliteal      artery.  It is felt that with this advanced disease and with the  diabetic small vessel disease that any operative therapy less than a     below  the knee amputation will not heal.  This has been discussed in      detail with the patient.  He will, therefore, have below the knee      amputation.  2.  With the documented stenosis of the major vessels of the upper leg, it      is felt that the patient will be a candidate for possible angioplasty      and/or stenting to try to increase in-flow to the leg.  This can be done      at a later date.  Because of mild renal failure, it is felt that      contrast studies should not be done at this time.  3.  Hypokalemia which has been treated and corrected.  4.  Severe anemia with hemoglobin of 7.9 which was non-corrected after two      units with a hemoglobin persisting at 7.9.  This is possibly a lab error      but repeat confirms this level, so he will be transfused another unit of      blood.  The patient has been continued on his baseline medications.   His overall prognosis is poor, and he has the potential of becoming a  bilateral amputee possibly at the above the knee level at a later date.  This has been discussed openly with the patient and with his daughter.      LCS/MEDQ  D:  08/22/2004  T:  08/22/2004  Job:  [id number]   cc:   Vickki Hearing, M.D.  Fax: [phone]   Madelin Rear. Sherwood Gambler, MD  [address]  [place]  [place] [address]  Fax: [phone]   Dirk Dress. Katrinka Blazing, M.D.  [address]  [place]  [place] [address]  Fax: [phone]

## 2010-10-17 NOTE — Assessment & Plan Note (Signed)
SUBJECTIVE:  Mr. Jost returns to the clinic today for follow up  evaluation accompanied by his male friend.  He is a 70 year old adult male  with non-insulin-dependent diabetes mellitus.  He underwent left below-the-  knee amputation August 20, 2004, for gangrene of the foot and ankle.   We saw the patient in this office Oct 15, 2004, prior to his obtaining his  prosthesis.  He obtained that about three weeks ago, and has had  approximately six visits with [facility].  He is  wearing his prosthesis almost totally throughout the day.  He reports no  problems with the prosthesis.  He reports that he is up to nine ply socks as  he has had some shrinkage of his residual limb since he has obtained the  prosthesis.  He still has a problem involving the right foot, and has been  told that he has to still wear the Prafo by Dr. Katrinka Blazing for at least another  month.  He is not able to use the diabetic shoes that Trey Paula from [facility] has made for him at this point.   The patient reports that his blood sugars have been in the 97 to 133 range.  His girlfriend or male friend has been trying to keep him from eating the  wrong foods at home on a regular basis.   MEDICATIONS:  1.  Tenormin 100 mg p.o. b.i.d.  2.  Avapro 300 mg daily.  3.  Protonix 40 mg daily.  4.  Zocor 40 mg daily.  5.  Iron b.i.d.  6.  Glyburide 2.5 mg b.i.d.  7.  Actos 45 mg daily.  8.  Multivitamin daily.   PHYSICAL EXAMINATION:  GENERAL APPEARANCE:  Well-appearing, reasonably thin,  adult male in mild to no acute discomfort.  VITAL SIGNS:  Blood pressure 165/65, pulse 73, respirations 16, O2  saturation 98% on room air.  He has a Prafo present on his right lower  extremity with dressing in place.  Examination of the left below-the-knee  amputation site shows that it is well healed.  He has no skin breakdown  whatsoever on the left lower extremity.  He does don and doff his  prosthesis  without any difficulty.  During ambulation, he has no significant problems  with alignment.   IMPRESSION:  1.  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  2.  History of calcaneal fracture and left ankle tendon repair November      2006.  3.  Non-insulin-dependent diabetes mellitus.  4.  Peripheral vascular disease.  5.  Chronic renal insufficiency.  6.  Hypertension.   PLAN:  At the present time, the patient is doing well and attending  outpatient therapy for progressive ambulation with his prosthesis.  He will  be seeing Trey Paula, probably next week for pretibial spacers or pads to be  placed in the prosthesis to prevent rotational torsion of his leg inside the  prosthesis.  We will plan on seeing him in follow up on  an as needed basis.  He will continue with his outpatient therapy  progressing towards probably no assisted device over the next 6-8 weeks.       DC/MedQ  D:  12/31/2004 14:22:28  T:  01/01/2005 06:14:04  Job #:  [id number]

## 2010-10-17 NOTE — Op Note (Signed)
NAME:  BRALLAN, DENIO NO.:  [id number]   MEDICAL RECORD NO.:  [id number]          PATIENT TYPE:  INP   LOCATION:  A307                          FACILITY:  [facility]   PHYSICIAN:  Dirk Dress. Katrinka Blazing, M.D.   DATE OF BIRTH:  1940-10-19   DATE OF PROCEDURE:  08/23/2004  DATE OF DISCHARGE:  08/27/2004                                 OPERATIVE REPORT   PREOPERATIVE DIAGNOSIS:  Gangrene, left foot.   POSTOPERATIVE DIAGNOSIS:  Gangrene, left foot.   OPERATION/PROCEDURE:  Left below-knee amputation.   SURGEON:  Dirk Dress. Katrinka Blazing, M.D.  Vickki Hearing, M.D.   DESCRIPTION OF PROCEDURE:  Under spinal anesthesia, the left lower extremity  was prepped and draped from the groin to the foot.  A fishmouth incision was  made immediately above the knee.  Subcutaneous tissue and vessels and muscle  were transected using electrocautery.  Incision was extended down to the  femur.  Femur was transected using a reciprocating saw.  Major vessels were  then suture ligated with 0 Monocryl.  All three major vessels were calcified  and fibrotic.  There was good muscle bleeding and good bleeding in the  subcutaneous tissue.  The edges of the bone were made smooth with a rasp.  The muscle and fascia were then closed with initial interrupted figure-of-  eight sutures of 0 Monocryl.  Subcutaneous tissue was closed with 2-0  Monocryl.  Skin was closed with staples.  Dressing was placed.  The patient  tolerated the procedure well.  He was transferred to a bed and taken to the  post anesthesia care unit for further monitoring.      LCS/MEDQ  D:  10/19/2004  T:  10/20/2004  Job:  [id number]

## 2010-10-17 NOTE — H&P (Signed)
NAME:  Steven [name], Steven [name] NO.:  [id number]   MEDICAL RECORD NO.:  [id number]          PATIENT TYPE:  AMB   LOCATION:  DAY                           FACILITY:  [facility]   PHYSICIAN:  Vickki Hearing, M.D.DATE OF BIRTH:  05-05-1941   DATE OF ADMISSION:  DATE OF DISCHARGE:  LH                                HISTORY & PHYSICAL   CHIEF COMPLAINT:  Left heel pain.   HISTORY:  This is a 70 year old male with history of diabetes, non-insulin-  dependent, and hypertension, who fell in his yard on October 22.  Complained  of posterior ankle pain.  Radiographs showed an Achilles tendon avulsion  with a large trunk of the calcaneus.  Presented to the office for evaluation  on October 26.  He was on Plavix, and his surgery had to be delayed until  the Plavix worked its way out of his system.  He is also on Benicar,  Glucovance, Actos, atenolol, and Lipitor.  He denies any previous surgery.  No allergies.   FAMILY HISTORY:  Negative.   SOCIAL HISTORY:  Positive for smoking and alcohol use. Educational level is  grade 12.   REVIEW OF SYSTEMS:  Normal for 10 systems reviewed.   PHYSICAL EXAMINATION:  VITAL SIGNS:  Weight 255, pulse 72, respiratory rate  18.  HEENT:  Grossly benign.  NECK:  Supple.  CHEST:  Clear.  HEART:  Regular rate and rhythm normal.  ABDOMEN:  Soft.  EXTREMITIES:  Left heel was swollen and tender.  There was retraction of the  calcaneus with a palpable defect at the insertion site of the fragment,  decreased plantar flexion strength, and a positive Thompson test for rupture  of the Achilles.   LABORATORY AND X-RAY DATA:  His radiograph shows a large avulsion.   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   PLAN:  Internal fixation with screw and washer.  May need pull-down  technique as well to get this back down to normal.  He is at high risk for  wound problems based on his diabetic history.  We will have  him worked up,  especially his coagulation studies to make sure he is stable for surgery.     [name]   SEH/MEDQ  D:  03/31/2004  T:  03/31/2004  Job:  [id number]   cc:   [facility]  Fax: [phone]

## 2010-10-20 ENCOUNTER — Ambulatory Visit (INDEPENDENT_AMBULATORY_CARE_PROVIDER_SITE_OTHER): Payer: Medicare Other | Admitting: Surgery

## 2010-10-20 DIAGNOSIS — I739 Peripheral vascular disease, unspecified: Secondary | ICD-10-CM

## 2010-10-21 NOTE — Assessment & Plan Note (Signed)
OFFICE VISIT  Steven, [name] DOB:  1941/05/22                                       10/20/2010 ZOXWR#:[id number]  The patient comes back today for followup.  He has been in a Profore Lite for wounds.  He has previously undergone angiogram and found to have severe tibial disease.  I was unable to recanalize his anterior tibial.  His wounds have healed with the Profore Lite.  I think the biggest issue is keeping the edema out of his leg.  He is having difficulty getting his stockings on.  Home health has recommended some form of a boot that can be slid on and added to it compression.  At this point I would be open to any options to keep the swelling out of his leg because I think that if we do not keep the swelling out his wounds will come back and he may not be able to heal these leading to a limb threatening situation.  I will consult with home health with regards to what options they were alluding to.  In the meantime I have reiterated to him that he needs to wear compression stockings and he needs to find a way to get them on every day.    [name], MD Electronically Signed  VWB/MEDQ  D:  10/20/2010  T:  10/21/2010  Job:  [id number]  cc:   Mila Homer. Sudie Bailey, M.D.

## 2011-03-12 LAB — BASIC METABOLIC PANEL
BUN: 20
CO2: 26
Chloride: 103
Creatinine, Ser: 1.67 — ABNORMAL HIGH
Glucose, Bld: 103 — ABNORMAL HIGH

## 2012-02-03 ENCOUNTER — Encounter (HOSPITAL_COMMUNITY): Payer: Self-pay

## 2012-02-03 ENCOUNTER — Emergency Department (HOSPITAL_COMMUNITY)
Admission: EM | Admit: 2012-02-03 | Discharge: 2012-02-03 | Disposition: A | Discharge status: Home / Self Care | Payer: Medicare Other | Attending: Emergency Medicine | Admitting: Emergency Medicine

## 2012-02-03 DIAGNOSIS — I890 Lymphedema, not elsewhere classified: Secondary | ICD-10-CM

## 2012-02-03 DIAGNOSIS — Z87891 Personal history of nicotine dependence: Secondary | ICD-10-CM | POA: Insufficient documentation

## 2012-02-03 DIAGNOSIS — I2510 Atherosclerotic heart disease of native coronary artery without angina pectoris: Secondary | ICD-10-CM | POA: Insufficient documentation

## 2012-02-03 DIAGNOSIS — S88119A Complete traumatic amputation at level between knee and ankle, unspecified lower leg, initial encounter: Secondary | ICD-10-CM | POA: Insufficient documentation

## 2012-02-03 DIAGNOSIS — E119 Type 2 diabetes mellitus without complications: Secondary | ICD-10-CM | POA: Insufficient documentation

## 2012-02-03 DIAGNOSIS — R238 Other skin changes: Secondary | ICD-10-CM

## 2012-02-03 DIAGNOSIS — Z4801 Encounter for change or removal of surgical wound dressing: Secondary | ICD-10-CM | POA: Insufficient documentation

## 2012-02-03 DIAGNOSIS — E7800 Pure hypercholesterolemia, unspecified: Secondary | ICD-10-CM | POA: Insufficient documentation

## 2012-02-03 DIAGNOSIS — I10 Essential (primary) hypertension: Secondary | ICD-10-CM | POA: Insufficient documentation

## 2012-02-03 HISTORY — DX: Atherosclerotic heart disease of native coronary artery without angina pectoris: I25.10

## 2012-02-03 HISTORY — DX: Essential (primary) hypertension: I10

## 2012-02-03 HISTORY — DX: Complete traumatic amputation at level between knee and ankle, unspecified lower leg, initial encounter: S88.119A

## 2012-02-03 HISTORY — DX: Pure hypercholesterolemia, unspecified: E78.00

## 2012-02-03 LAB — BASIC METABOLIC PANEL
BUN: 24 mg/dL — ABNORMAL HIGH (ref 6–23)
Calcium: 9.4 mg/dL (ref 8.4–10.5)
GFR calc non Af Amer: 40 mL/min — ABNORMAL LOW (ref >90)
Glucose, Bld: 93 mg/dL (ref 70–99)
Potassium: 3.8 mEq/L (ref 3.5–5.1)

## 2012-02-03 LAB — CBC
HCT: 38.2 % — ABNORMAL LOW (ref 39.0–52.0)
Hemoglobin: 12.7 g/dL — ABNORMAL LOW (ref 13.0–17.0)
MCH: 22.3 pg — ABNORMAL LOW (ref 26.0–34.0)
MCHC: 33.2 g/dL (ref 30.0–36.0)
MCV: 67 fL — ABNORMAL LOW (ref 78.0–100.0)

## 2012-02-03 NOTE — ED Notes (Signed)
Pt here for large abcess to right lower leg.  Area began draining during triage

## 2012-02-03 NOTE — ED Provider Notes (Signed)
History    This chart was scribed for Tobin Chad, MD, MD by Smitty Pluck. The patient was seen in room APA17 and the patient's care was started at 3:17PM.   CSN: [id number]  Arrival date & time 02/03/12  1352   First MD Initiated Contact with Patient 02/03/12 1517      Chief Complaint  Patient presents with  . Wound Check    (Consider location/radiation/quality/duration/timing/severity/associated sxs/prior treatment) Patient is a 71 y.o. male presenting with wound check. The history is provided by the patient.  Wound Check    Steven [name] is a 71 y.o. male who presents to the Emergency Department complaining of moderate wound on lower right leg onset 2 day ago. Pt reports that he has serous drainage from wound. He denies SOB, fevers and numbness. He had operation on his right leg due to tearing his achilles tendon (2005). Pt reports that he has hx of diabetes.  PCP is Dr. Sudie Bailey   Past Medical History  Diagnosis Date  . Amputated below knee   . Coronary artery disease   . Diabetes mellitus   . Hypertension   . Hypercholesteremia     Past Surgical History  Procedure Date  . Amputation     No family history on file.  History  Substance Use Topics  . Smoking status: Former Games developer  . Smokeless tobacco: Not on file  . Alcohol Use: No      Review of Systems  All other systems reviewed and are negative.   10 Systems reviewed and all are negative for acute change except as noted in the HPI.   Allergies  Amlodipine besylate-valsartan  Home Medications  No current outpatient prescriptions on file.  BP 176/83  Pulse 92  Temp 98.5 F (36.9 C) (Oral)  Resp 20  Ht 5\' 6"  (1.676 m)  Wt 285 lb (129.275 kg)  BMI 46.00 kg/m2  SpO2 100%  Physical Exam  Nursing note and vitals reviewed. Constitutional: He is oriented to person, place, and time. He appears well-developed and well-nourished. No distress.  HENT:  Head: Normocephalic and atraumatic.  Right  Ear: External ear normal.  Left Ear: External ear normal.  Nose: Nose normal.  Mouth/Throat: Oropharynx is clear and moist.  Eyes: Conjunctivae and EOM are normal. Pupils are equal, round, and reactive to light. Right eye exhibits no discharge. Left eye exhibits no discharge. No scleral icterus.  Neck: Normal range of motion. Neck supple. No JVD present. No tracheal deviation present.  Cardiovascular: Normal rate, regular rhythm, normal heart sounds and intact distal pulses.  Exam reveals no gallop and no friction rub.   No murmur heard. Pulmonary/Chest: Effort normal and breath sounds normal. No stridor. No respiratory distress. He has no wheezes. He has no rales. He exhibits no tenderness.  Abdominal: Soft. Bowel sounds are normal. He exhibits no mass. There is no tenderness. There is no rebound and no guarding.  Musculoskeletal: He exhibits edema.       Left leg below-the-knee amputation noted. Right lower leg significantly swollen with chronic lymphedematous skin and tissue changes.. Nontender to palpation. No erythema of the surrounding skin. Small amount of drainage from lesion appears serous. Note no turbidity or purulent material.  Lymphadenopathy:    He has no cervical adenopathy.  Neurological: He is alert and oriented to person, place, and time. No cranial nerve deficit.  Skin: Skin is warm. No rash noted. He is not diaphoretic. No erythema. No pallor.  Large bullous right pretibial lesion.  Psychiatric: He has a normal mood and affect. His behavior is normal.    ED Course  Procedures (including critical care time) DIAGNOSTIC STUDIES: Oxygen Saturation is 100% on room air, normal by my interpretation.    COORDINATION OF CARE: 3:21PM Discussed pt ED treatment with pt.   5:08PM Rechecked pt and discussed lab results with pt. Pt referred to wound care center for post ED treatment.   Labs Reviewed  CBC - Abnormal; Notable for the following:    Hemoglobin 12.7 (*)     HCT  38.2 (*)     MCV 67.0 (*)     MCH 22.3 (*)     RDW 16.5 (*)     All other components within normal limits  BASIC METABOLIC PANEL - Abnormal; Notable for the following:    BUN 24 (*)     Creatinine, Ser 1.64 (*)     GFR calc non Af Amer 40 (*)     GFR calc Af Amer 47 (*)     All other components within normal limits   No results found.   No diagnosis found.    MDM  Patient presents for evaluation of a bullous lesion of his right lower leg. He denies any systemic symptoms and on inspection there is a large bullous lesion slightly larger than the diameter of a baseball along his right pretibial area. There significant lymphedematous changes in this leg below-the-knee. The skin however is not erythematous and there is no purulent drainage from this site. There is also no pain with palpation around the area. We'll at this time obtain basic labs however it appears that this is a of an infectious etiology. Will be most appropriate to his long-term followup with a wound care specialist. He is at high risk for infection at the site. Will not unroofed this lesion. With the exception of small amount of leakage it is intact at this time. Will allow overlying skin is a biologic bandage.  Note no acute lab abnormalities.  Pt will be contacted at home by the wound care center.  Plan D/C.  Discussed indications for immediate return to the emergency department.         Tobin Chad, MD 02/03/12 [phone]

## 2012-02-03 NOTE — ED Notes (Signed)
Patient noted to have large pocket of fluid on right leg. Patient began rubbing area and wound began to drain, serous fluid.

## 2012-02-05 ENCOUNTER — Ambulatory Visit (HOSPITAL_COMMUNITY)
Admission: RE | Admit: 2012-02-05 | Discharge: 2012-02-05 | Disposition: A | Discharge status: Home / Self Care | Payer: Medicare Other | Source: Ambulatory Visit | Attending: Family Medicine | Admitting: Family Medicine

## 2012-02-05 DIAGNOSIS — S81009A Unspecified open wound, unspecified knee, initial encounter: Secondary | ICD-10-CM | POA: Insufficient documentation

## 2012-02-05 DIAGNOSIS — S91009A Unspecified open wound, unspecified ankle, initial encounter: Secondary | ICD-10-CM | POA: Insufficient documentation

## 2012-02-05 DIAGNOSIS — IMO0001 Reserved for inherently not codable concepts without codable children: Secondary | ICD-10-CM | POA: Insufficient documentation

## 2012-02-05 NOTE — Progress Notes (Signed)
Physical Therapy - Wound Therapy  Evaluation   Patient Details  Name: Steven [name] MRN: [id number] Date of Birth: Sep 15, 1940  Today's Date: 02/05/2012 Time:1600  - 1700    Visit#: 1  of 8   Re-eval: 03/06/12  Subjective Subjective Assessment Subjective: Mr. Teall states he is not sure what happened he just woke up three days ago to a significant blister on his right leg.  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; significant lymphedema on the R LE Date of Onset: 02/02/12 Prior Treatments: self dressing  Pain Assessment Pain Assessment Pain Assessment: No/denies pain  Wound Therapy Wound 02/05/12 (Active)  Site / Wound Assessment Granulation tissue 02/05/2012  5:07 PM  % Wound base Red or Granulating 90% 02/05/2012  5:07 PM  % Wound base Yellow 10% 02/05/2012  5:07 PM  Peri-wound Assessment Intact 02/05/2012  5:07 PM  Wound Length (cm) 11 cm 02/05/2012  5:07 PM  Wound Width (cm) 10 cm 02/05/2012  5:07 PM  Wound Depth (cm) 0.2 cm 02/05/2012  5:07 PM  Margins Attached edges (approximated) 02/05/2012  5:07 PM  Closure None 02/05/2012  5:07 PM  Drainage Amount Copious 02/05/2012  5:07 PM  Drainage Description Serous 02/05/2012  5:07 PM  Non-staged Wound Description Partial thickness 02/05/2012  5:07 PM  Treatment Cleansed;Debridement (Selective) 02/05/2012  5:07 PM  Dressing Type Compression wrap 02/05/2012  5:07 PM  Dressing Changed New 02/05/2012  5:07 PM  Dressing Status Clean 02/05/2012  5:07 PM   Selective Debridement Selective Debridement - Location: entire wound Selective Debridement - Tissue Removed: slough(newly dead skiln from blister)   Physical Therapy Assessment and Plan Wound Therapy - Assess/Plan/Recommendations Wound Therapy - Clinical Statement: Pt with significant lymphedema and open wound that will benefit from skilled therapy due to high risk of non-healing.  Pt L LE  amputated so we are unable to compare volumes but R LE volume from MTP to knee is currently at 27535.96cc Wound Therapy - Functional Problem List: difficulty to walk Factors Delaying/Impairing Wound Healing: Diabetes Mellitus;Vascular compromise;Immobility;Polypharmacy;Multiple medical problems Hydrotherapy Plan: Debridement;Dressing change;Patient/family education (compression wrapping with foam and short stretch bandage.  ) Wound Therapy - Frequency:  (2x week) Wound Therapy - Current Recommendations: PT Wound Plan: see twice a week to begin with to assess how wound is healing.  Once we are sure compression wrapping and wound are doing well we can decrease to one time a week.      Goals Wound Therapy Goals - Improve the function of patient's integumentary system by progressing the wound(s) through the phases of wound healing by: Decrease Necrotic Tissue to: 0 Decrease Necrotic Tissue - Progress: Goal set today Increase Granulation Tissue to: 100 Increase Granulation Tissue - Progress: Goal set today Decrease Length/Width/Depth by (cm): 5x4x0 Decrease Length/Width/Depth - Progress: Goal set today Improve Drainage Characteristics: Min Improve Drainage Characteristics - Progress: Goal set today Patient/Family will be able to : verbalize self dressing and importance of compression stocking Patient/Family Instruction Goal - Progress: Goal set today Time For Goal Achievement:  (4 wk) Wound Therapy - Potential for Goals: Good  Problem List Patient Active Problem List  Diagnosis  . DIABETES MELLITUS, TYPE II, CONTROLLED, WITH COMPLICATIONS  . HYPERLIPIDEMIA  . ANEMIA-NOS  . CATARACT NOS  . HYPERTENSION  . RENAL FAILURE, ACUTE  . KIDNEY DISEASE, CHRONIC, STAGE III  . WEAKNESS  . DYSPNEA ON EXERTION  .  CEREBROVASCULAR ACCIDENT, HX OF    GP Functional Assessment Tool Used: clinical judgement-lymphedema measurement Functional Limitation: Other PT primary Other PT Primary Current  Status (Z6109): At least 80 percent but less than 100 percent impaired, limited or restricted Other PT Primary Goal Status (U0454): At least 20 percent but less than 40 percent impaired, limited or restricted  Kenya Kook,CINDY 02/05/2012, 5:12 PM

## 2012-02-09 ENCOUNTER — Ambulatory Visit (HOSPITAL_COMMUNITY)
Admission: RE | Admit: 2012-02-09 | Discharge: 2012-02-09 | Disposition: A | Discharge status: Home / Self Care | Payer: Medicare Other | Source: Ambulatory Visit | Attending: Family Medicine | Admitting: Family Medicine

## 2012-02-09 NOTE — Progress Notes (Signed)
Physical Therapy - Wound Therapy  Treatment   Patient Details  Name: Steven [name] MRN: [id number] Date of Birth: 1940/06/29  Today's Date: 02/09/2012 Time: 4098-1191 Time Calculation (min): 39 min  Visit#: 2  of 8    Subjective Subjective Assessment Subjective: No pain  Pain Assessment Pain Assessment Pain Assessment: No/denies pain  Wound Therapy Wound 02/05/12 (Active)  Site / Wound Assessment Granulation tissue 02/09/2012  3:14 PM  % Wound base Red or Granulating 90% 02/09/2012  3:14 PM  % Wound base Yellow 10% 02/09/2012  3:14 PM  % Wound base Other (Comment) 0% 02/09/2012  3:14 PM  Peri-wound Assessment Maceration 02/09/2012  3:14 PM  Wound Length (cm) 11 cm 02/05/2012  5:07 PM  Wound Width (cm) 10 cm 02/05/2012  5:07 PM  Wound Depth (cm) 0.2 cm 02/05/2012  5:07 PM  Margins Attached edges (approximated) 02/05/2012  5:07 PM  Closure None 02/05/2012  5:07 PM  Drainage Amount Copious 02/09/2012  3:14 PM  Drainage Description Serous 02/09/2012  3:14 PM  Non-staged Wound Description Partial thickness 02/09/2012  3:14 PM  Treatment Cleansed;Debridement (Selective) 02/09/2012  3:14 PM  Dressing Type Silver hydrofiber 02/09/2012  3:14 PM  Dressing Changed Changed 02/09/2012  3:14 PM  Dressing Status Clean 02/09/2012  3:14 PM   Selective Debridement Selective Debridement - Location: edges of wound Selective Debridement - Tools Used: Forceps Selective Debridement - Tissue Removed: slough   Physical Therapy Assessment and Plan Wound Therapy - Assess/Plan/Recommendations Wound Therapy - Clinical Statement: Wound had drained significantly;  Foam and short-stretch bandages were wet.  Pt LE macerated.  Wound has a slight smell currently therefore switched to silver dressing.  If wound drainage continues to be copious may need to add alginate dressing to absorb drainage.  Compression dressing did decrease LE but will need to wait on this until wound is not copiously draining Factors  Delaying/Impairing Wound Healing: Diabetes Mellitus;Infection - systemic/local;Immobility;Multiple medical problems Hydrotherapy Plan: Debridement;Dressing change;Patient/family education Wound Therapy - Frequency:  (if draining does not decrease increase to 3x/week) Wound Plan: Hold compression wrapping until wound drainage diminishes somewhat.  May add calcium alginate dressing next treatment if drainage is stll macerating LE.      Goals    Problem List Patient Active Problem List  Diagnosis  . DIABETES MELLITUS, TYPE II, CONTROLLED, WITH COMPLICATIONS  . HYPERLIPIDEMIA  . ANEMIA-NOS  . CATARACT NOS  . HYPERTENSION  . RENAL FAILURE, ACUTE  . KIDNEY DISEASE, CHRONIC, STAGE III  . WEAKNESS  . DYSPNEA ON EXERTION  . CEREBROVASCULAR ACCIDENT, HX OF    GP    RUSSELL,CINDY 02/09/2012, 3:21 PM

## 2012-02-11 ENCOUNTER — Ambulatory Visit (HOSPITAL_COMMUNITY)
Admission: RE | Admit: 2012-02-11 | Discharge: 2012-02-11 | Disposition: A | Discharge status: Home / Self Care | Payer: Medicare Other | Source: Ambulatory Visit | Attending: Family Medicine | Admitting: Family Medicine

## 2012-02-11 NOTE — Progress Notes (Signed)
Physical Therapy - Wound Therapy  Treatment   Patient Details  Name: Steven [name] MRN: [id number] Date of Birth: Jul 04, 1940  Today's Date: 02/11/2012 Time: 5621-3086 Time Calculation (min): 45 min  Visit#: 3  of 8   Re-eval: 03/12/12  Subjective Subjective Assessment Subjective: Pt states that his drainage was significant and he had to change the dressing yesterday.  Pt states that when he changed the dressing the drainage had caused some open wounds on the inside of his leg Patient and Family Stated Goals: wound to heal  Pain Assessment Pain Assessment Pain Assessment: 0-10 Pain Score:   3 Pain Type: Acute pain Pain Location: Leg Pain Orientation: Right;Lower Pain Descriptors: Shooting Pain Intervention(s): Repositioned  Wound Therapy Wound 02/05/12 (Active)  Site / Wound Assessment Granulation tissue 02/11/2012  3:24 PM  % Wound base Red or Granulating 95% 02/11/2012  3:24 PM  % Wound base Yellow 5% 02/11/2012  3:24 PM  % Wound base Black 0% 02/11/2012  3:24 PM  % Wound base Other (Comment) 0% 02/11/2012  3:24 PM  Peri-wound Assessment Edema 02/11/2012  3:24 PM  Wound Length (cm) 11 cm 02/05/2012  5:07 PM  Wound Width (cm) 10 cm 02/05/2012  5:07 PM  Wound Depth (cm) 0.2 cm 02/05/2012  5:07 PM  Margins Attached edges (approximated) 02/05/2012  5:07 PM  Closure None 02/11/2012  3:24 PM  Drainage Amount Copious 02/11/2012  3:24 PM  Drainage Description Serosanguineous 02/11/2012  3:24 PM  Non-staged Wound Description Partial thickness 02/11/2012  3:24 PM  Treatment Cleansed;Debridement (Selective) 02/11/2012  3:24 PM  Dressing Type Alginate;Compression wrap;Silver hydrofiber 02/11/2012  3:24 PM  Dressing Changed Changed 02/11/2012  3:24 PM  Dressing Status Clean 02/11/2012  3:24 PM     Wound 02/11/12 Contact dermatitis Leg medial aspect  4 different wounds the largest is 7cm x .5 appears backwad L with distal width being 2.5 w no depth.  All wounds covered. (Active)  Site / Wound  Assessment Granulation tissue 02/11/2012  3:24 PM  % Wound base Red or Granulating 100% 02/11/2012  3:24 PM  % Wound base Yellow 0% 02/11/2012  3:24 PM  Peri-wound Assessment Intact 02/11/2012  3:24 PM  Wound Length (cm) 7 cm 02/11/2012  3:24 PM  Wound Width (cm) 0.5 cm 02/11/2012  3:24 PM  Margins Attached edges (approximated) 02/11/2012  3:24 PM  Closure None 02/11/2012  3:24 PM  Drainage Amount Minimal 02/11/2012  3:24 PM  Drainage Description Serous 02/11/2012  3:24 PM  Non-staged Wound Description Partial thickness 02/11/2012  3:24 PM  Treatment Cleansed 02/11/2012  3:24 PM  Dressing Type Compression wrap 02/11/2012  3:24 PM  Pt had gentle pumping from forefoot to knee to attempt to decrease swelling. Selective Debridement Selective Debridement - Location: edges of wound Selective Debridement - Tools Used: Forceps Selective Debridement - Tissue Removed: slough   Physical Therapy Assessment and Plan Wound Therapy - Assess/Plan/Recommendations Wound Therapy - Clinical Statement: Pt wounds are decreasing in drainage but remains copious.  Used silver dressing f/b calcium alginate to absorb the drainage.  Followed by compression wrapping using short-stretch bandage. Factors Delaying/Impairing Wound Healing: Diabetes Mellitus;Infection - systemic/local;Immobility;Multiple medical problems Hydrotherapy Plan: Debridement;Dressing change;Patient/family education Wound Therapy - Frequency:  (2x /week increase to 3 times next week if drainage copious) Wound Plan: Added calcium alginate.  Macerated area looked better but drainage caused wounds to appear on the inside of his leg.  Continue to work on keeping dressing dry.  If wound drainage is still copious then  use 2 sheets of calcium alginate next treatment.      Goals   Wound to heal Problem List Patient Active Problem List  Diagnosis  . DIABETES MELLITUS, TYPE II, CONTROLLED, WITH COMPLICATIONS  . HYPERLIPIDEMIA  . ANEMIA-NOS  . CATARACT NOS  .  HYPERTENSION  . RENAL FAILURE, ACUTE  . KIDNEY DISEASE, CHRONIC, STAGE III  . WEAKNESS  . DYSPNEA ON EXERTION  . CEREBROVASCULAR ACCIDENT, HX OF    GP    Lakesa Coste,CINDY 02/11/2012, 3:36 PM

## 2012-02-16 ENCOUNTER — Ambulatory Visit (HOSPITAL_COMMUNITY)
Admission: RE | Admit: 2012-02-16 | Discharge: 2012-02-16 | Disposition: A | Discharge status: Home / Self Care | Payer: Medicare Other | Source: Ambulatory Visit | Attending: Family Medicine | Admitting: Family Medicine

## 2012-02-16 NOTE — Progress Notes (Signed)
Physical Therapy - Wound Therapy  Treatment   Patient Details  Name: Steven [name] MRN: [id number] Date of Birth: 21-Jun-1940  Today's Date: 02/16/2012 Time: 5621-3086 Time Calculation (min): 43 min  Visit#: 4  of 8   Re-eval: 03/11/12  Subjective Subjective Assessment Subjective: Pt. states he changed his dressings on Sunday per therapist instruction.  Drainage had not seeped through bandages today.  Pain Assessment Pain Assessment Pain Assessment: No/denies pain  Wound Therapy Wound 02/05/12 (Active)  Site / Wound Assessment Granulation tissue 02/16/2012  4:52 PM  % Wound base Red or Granulating 95% 02/16/2012  4:52 PM  % Wound base Yellow 5% 02/16/2012  4:52 PM  % Wound base Black 0% 02/16/2012  4:52 PM  % Wound base Other (Comment) 0% 02/16/2012  4:52 PM  Peri-wound Assessment Edema 02/16/2012  4:52 PM  Wound Length (cm) 11 cm 02/05/2012  5:07 PM  Wound Width (cm) 10 cm 02/05/2012  5:07 PM  Wound Depth (cm) 0.2 cm 02/05/2012  5:07 PM  Margins Attached edges (approximated) 02/05/2012  5:07 PM  Closure None 02/11/2012  3:24 PM  Drainage Amount Moderate 02/16/2012  4:52 PM  Drainage Description Serous 02/16/2012  4:52 PM  Non-staged Wound Description Not applicable 02/16/2012  4:52 PM  Treatment Cleansed;Debridement (Selective) 02/16/2012  4:52 PM  Dressing Type Silver hydrofiber;Compression wrap 02/16/2012  4:52 PM  Dressing Changed Changed 02/16/2012  4:52 PM  Dressing Status Clean;Dry;Intact 02/16/2012  4:52 PM     Wound 02/11/12 Contact dermatitis Leg medial aspect  4 different wounds the largest is 7cm x .5 appears backwad L with distal width being 2.5 w no depth.  All wounds covered. (Active)  Site / Wound Assessment Granulation tissue 02/16/2012  4:52 PM  % Wound base Red or Granulating 95% 02/16/2012  4:52 PM  % Wound base Yellow 5% 02/16/2012  4:52 PM  Peri-wound Assessment Intact 02/16/2012  4:52 PM  Wound Length (cm) 7 cm 02/11/2012  3:24 PM  Wound Width (cm) 0.5 cm 02/11/2012  3:24  PM  Margins Attached edges (approximated) 02/11/2012  3:24 PM  Closure None 02/16/2012  4:52 PM  Drainage Amount Minimal 02/16/2012  4:52 PM  Drainage Description Serous 02/16/2012  4:52 PM  Non-staged Wound Description Not applicable 02/16/2012  4:52 PM  Treatment Cleansed;Debridement (Selective) 02/16/2012  4:52 PM  Dressing Type Silver hydrofiber;Compression wrap 02/16/2012  4:52 PM   Selective Debridement Selective Debridement - Location: Entire wound bed of all wounds Selective Debridement - Tools Used: Forceps Selective Debridement - Tissue Removed: slough   Physical Therapy Assessment and Plan Wound Therapy - Assess/Plan/Recommendations Wound Therapy - Clinical Statement: Continues to weep due to swelling.  Pt. did not bring short stretch bandage today, so used basic compression wrap (coban).  Pt. would benefit from CDT for R LE including woundcare, MLD and bandaging for lymphedema.  Will speak to evaluating therapist and order supplies if in agreement. Wound Plan: Continue woundcare and add CDT if agreeable by MD and evaluating therapist.       Problem List Patient Active Problem List  Diagnosis  . DIABETES MELLITUS, TYPE II, CONTROLLED, WITH COMPLICATIONS  . HYPERLIPIDEMIA  . ANEMIA-NOS  . CATARACT NOS  . HYPERTENSION  . RENAL FAILURE, ACUTE  . KIDNEY DISEASE, CHRONIC, STAGE III  . WEAKNESS  . DYSPNEA ON EXERTION  . CEREBROVASCULAR ACCIDENT, HX OF     [name], PTA/CLT 02/16/2012, 5:15 PM

## 2012-02-18 ENCOUNTER — Ambulatory Visit (HOSPITAL_COMMUNITY)
Admission: RE | Admit: 2012-02-18 | Discharge: 2012-02-18 | Disposition: A | Discharge status: Home / Self Care | Payer: Medicare Other | Source: Ambulatory Visit | Attending: Family Medicine | Admitting: Family Medicine

## 2012-02-18 NOTE — Progress Notes (Signed)
Physical Therapy - Wound Therapy  Treatment   Patient Details  Name: Steven [name] MRN: [id number] Date of Birth: [birth year]  Today's Date: 02/18/2012 Time: 1310-1350 Time Calculation (min): 40 min Charges: Selective debridement (= or < 20 cm)   Visit#: 5  of 8   Re-eval: 03/11/12  Subjective Subjective Assessment Subjective: Pt states that he believes his swelling is going down.  Pain Assessment Pain Assessment Pain Assessment: No/denies pain Pain Score: 0-No pain  Wound Therapy Wound 02/05/12 (Active)  Site / Wound Assessment Granulation tissue 02/18/2012  2:13 PM  % Wound base Red or Granulating 95% 02/18/2012  2:13 PM  % Wound base Yellow 5% 02/18/2012  2:13 PM  % Wound base Black 0% 02/18/2012  2:13 PM  % Wound base Other (Comment) 0% 02/18/2012  2:13 PM  Peri-wound Assessment Edema 02/18/2012  2:13 PM  Wound Length (cm) 11 cm 02/05/2012  5:07 PM  Wound Width (cm) 10 cm 02/05/2012  5:07 PM  Wound Depth (cm) 0.2 cm 02/05/2012  5:07 PM  Margins Attached edges (approximated) 02/05/2012  5:07 PM  Closure None 02/11/2012  3:24 PM  Drainage Amount Moderate 02/18/2012  2:13 PM  Drainage Description Serous 02/18/2012  2:13 PM  Non-staged Wound Description Not applicable 02/18/2012  2:13 PM  Treatment Cleansed;Debridement (Selective) 02/18/2012  2:13 PM  Dressing Type Compression wrap;Hydrogel;Silver dressings 02/18/2012  2:13 PM  Dressing Changed Changed 02/18/2012  2:13 PM  Dressing Status Clean;Dry;Intact 02/18/2012  2:13 PM     Wound 02/11/12 Contact dermatitis Leg medial aspect  4 different wounds the largest is 7cm x .5 appears backwad L with distal width being 2.5 w no depth.  All wounds covered. (Active)  Site / Wound Assessment Granulation tissue 02/18/2012  2:13 PM  % Wound base Red or Granulating 95% 02/18/2012  2:13 PM  % Wound base Yellow 5% 02/18/2012  2:13 PM  Peri-wound Assessment Intact 02/18/2012  2:13 PM  Wound Length (cm) 7 cm 02/11/2012  3:24 PM  Wound Width (cm) 0.5 cm  02/11/2012  3:24 PM  Margins Attached edges (approximated) 02/11/2012  3:24 PM  Closure None 02/18/2012  2:13 PM  Drainage Amount Minimal 02/18/2012  2:13 PM  Drainage Description Serous 02/18/2012  2:13 PM  Non-staged Wound Description Not applicable 02/18/2012  2:13 PM  Treatment Cleansed;Debridement (Selective) 02/18/2012  2:13 PM  Dressing Type Compression wrap;Hydrogel;Silver dressings 02/18/2012  2:13 PM  Dressing Changed Changed 02/18/2012  2:13 PM  Dressing Status Clean;Dry;Intact 02/18/2012  2:13 PM   Selective Debridement Selective Debridement - Location: Entire wound bed of all wounds Selective Debridement - Tools Used: Forceps Selective Debridement - Tissue Removed: slough   Physical Therapy Assessment and Plan Wound Therapy - Assess/Plan/Recommendations Wound Therapy - Clinical Statement: Minimal drainage noted. Wounds were dry when uncovered. Changed dressing to hydrogel and silver acticoat. Vaseline applied to lower leg around wounds to protect skin integrity. Pt reports no pain at end of session. Wound Plan: Continue woundcare per PT POC.      Problem List Patient Active Problem List  Diagnosis  . DIABETES MELLITUS, TYPE II, CONTROLLED, WITH COMPLICATIONS  . HYPERLIPIDEMIA  . ANEMIA-NOS  . CATARACT NOS  . HYPERTENSION  . RENAL FAILURE, ACUTE  . KIDNEY DISEASE, CHRONIC, STAGE III  . WEAKNESS  . DYSPNEA ON EXERTION  . CEREBROVASCULAR ACCIDENT, HX OF    Seth Bake, PTA 02/18/2012, 2:21 PM

## 2012-02-23 ENCOUNTER — Ambulatory Visit (HOSPITAL_COMMUNITY)
Admission: RE | Admit: 2012-02-23 | Discharge: 2012-02-23 | Disposition: A | Discharge status: Home / Self Care | Payer: Medicare Other | Source: Ambulatory Visit | Attending: Family Medicine | Admitting: Family Medicine

## 2012-02-23 NOTE — Progress Notes (Signed)
Physical Therapy - Wound Therapy  Treatment   Patient Details  Name: Steven [name] MRN: [id number] Date of Birth: [birth year]  Today's Date: 02/23/2012 Time: 1450-1540 Time Calculation (min): 50 min  Visit#: 6  of 8   Re-eval: 03/11/12 Charges: Selective debridement (= or < 20 cm)   Subjective Subjective Assessment Subjective: Pt is without complaint.  Pain Assessment Pain Assessment Pain Assessment: No/denies pain Pain Score: 0-No pain  Wound Therapy Wound 02/05/12 (Active)  Site / Wound Assessment Granulation tissue 02/23/2012  3:47 PM  % Wound base Red or Granulating 95% 02/23/2012  3:47 PM  % Wound base Yellow 5% 02/23/2012  3:47 PM  % Wound base Black 0% 02/23/2012  3:47 PM  % Wound base Other (Comment) 0% 02/23/2012  3:47 PM  Peri-wound Assessment Edema 02/23/2012  3:47 PM  Wound Length (cm) 11 cm 02/05/2012  5:07 PM  Wound Width (cm) 10 cm 02/05/2012  5:07 PM  Wound Depth (cm) 0.2 cm 02/05/2012  5:07 PM  Margins Attached edges (approximated) 02/05/2012  5:07 PM  Closure None 02/11/2012  3:24 PM  Drainage Amount Moderate 02/23/2012  3:47 PM  Drainage Description Serous 02/23/2012  3:47 PM  Non-staged Wound Description Not applicable 02/23/2012  3:47 PM  Treatment Cleansed;Debridement (Selective) 02/23/2012  3:47 PM  Dressing Type Compression wrap;Hydrogel 02/23/2012  3:47 PM  Dressing Changed Changed 02/23/2012  3:47 PM  Dressing Status Clean;Dry;Intact 02/23/2012  3:47 PM     Wound 02/11/12 Contact dermatitis Leg medial aspect  4 different wounds the largest is 7cm x .5 appears backwad L with distal width being 2.5 w no depth.  All wounds covered. (Active)  Site / Wound Assessment Granulation tissue 02/23/2012  3:47 PM  % Wound base Red or Granulating 95% 02/23/2012  3:47 PM  % Wound base Yellow 5% 02/23/2012  3:47 PM  Peri-wound Assessment Intact 02/23/2012  3:47 PM  Wound Length (cm) 7 cm 02/11/2012  3:24 PM  Wound Width (cm) 0.5 cm 02/11/2012  3:24 PM  Margins Attached edges  (approximated) 02/11/2012  3:24 PM  Closure None 02/23/2012  3:47 PM  Drainage Amount Minimal 02/23/2012  3:47 PM  Drainage Description Serous 02/23/2012  3:47 PM  Non-staged Wound Description Not applicable 02/23/2012  3:47 PM  Treatment Cleansed;Debridement (Selective) 02/23/2012  3:47 PM  Dressing Type Compression wrap;Hydrogel 02/23/2012  3:47 PM  Dressing Changed Changed 02/23/2012  3:47 PM  Dressing Status Clean;Dry;Intact 02/23/2012  3:47 PM   Selective Debridement Selective Debridement - Location: Entire wound bed of all wounds Selective Debridement - Tools Used: Forceps Selective Debridement - Tissue Removed: slough/dry skin   Physical Therapy Assessment and Plan Wound Therapy - Assess/Plan/Recommendations Wound Therapy - Clinical Statement: Wounds continue to be relatively dry but dressing come off easily when moistened with saline. Significant amount of dry skin removed this session. Swelling appears to have decreased. Vaseline applied liberally to lower leg around wounds. Silver not needed this session. Wound dressed with hydrogel. Wound Plan: Continue wound care per PT POC.       Problem List Patient Active Problem List  Diagnosis  . DIABETES MELLITUS, TYPE II, CONTROLLED, WITH COMPLICATIONS  . HYPERLIPIDEMIA  . ANEMIA-NOS  . CATARACT NOS  . HYPERTENSION  . RENAL FAILURE, ACUTE  . KIDNEY DISEASE, CHRONIC, STAGE III  . WEAKNESS  . DYSPNEA ON EXERTION  . CEREBROVASCULAR ACCIDENT, HX OF     Seth Bake, PTA 02/23/2012, 3:57 PM

## 2012-02-25 ENCOUNTER — Ambulatory Visit (HOSPITAL_COMMUNITY): Payer: Medicare Other | Admitting: Physical Therapy

## 2012-02-26 ENCOUNTER — Ambulatory Visit (HOSPITAL_COMMUNITY)
Admission: RE | Admit: 2012-02-26 | Discharge: 2012-02-26 | Disposition: A | Discharge status: Home / Self Care | Payer: Medicare Other | Source: Ambulatory Visit | Attending: Family Medicine | Admitting: Family Medicine

## 2012-02-26 NOTE — Progress Notes (Signed)
Physical Therapy - Wound Therapy  Treatment   Patient Details  Name: Jaspal Pultz MRN: [id number] Date of Birth: 07-May-1941  Today's Date: 02/26/2012 Time: 1610-1650 Time Calculation (min): 40 min  Visit#: 7  of 8   Re-eval: 03/11/12 Charge: selective debridement >20cm  Subjective Subjective Assessment Subjective: Pt stated wound is pain free.  Pain Assessment Pain Assessment Pain Assessment: No/denies pain  Wound Therapy Wound 02/05/12 (Active)  Site / Wound Assessment Granulation tissue 02/26/2012  5:09 PM  % Wound base Red or Granulating 95% 02/26/2012  5:09 PM  % Wound base Yellow 5% 02/26/2012  5:09 PM  % Wound base Black 0% 02/26/2012  5:09 PM  % Wound base Other (Comment) 0% 02/23/2012  3:47 PM  Peri-wound Assessment Edema 02/26/2012  5:09 PM  Wound Length (cm) 11 cm 02/05/2012  5:07 PM  Wound Width (cm) 10 cm 02/05/2012  5:07 PM  Wound Depth (cm) 0.2 cm 02/05/2012  5:07 PM  Margins Attached edges (approximated) 02/05/2012  5:07 PM  Closure None 02/11/2012  3:24 PM  Drainage Amount Minimal 02/26/2012  5:09 PM  Drainage Description Serous 02/26/2012  5:09 PM  Non-staged Wound Description Not applicable 02/26/2012  5:09 PM  Treatment Cleansed;Debridement (Selective) 02/26/2012  5:09 PM  Dressing Type Hydrogel;Gauze (Comment);Compression wrap 02/26/2012  5:09 PM  Dressing Changed Changed 02/26/2012  5:09 PM  Dressing Status Clean;Dry;Intact 02/26/2012  5:09 PM     Wound 02/11/12 Contact dermatitis Leg medial aspect  4 different wounds the largest is 7cm x .5 appears backwad L with distal width being 2.5 w no depth.  All wounds covered. (Active)  Site / Wound Assessment Granulation tissue 02/26/2012  5:09 PM  % Wound base Red or Granulating 95% 02/26/2012  5:09 PM  % Wound base Yellow 5% 02/26/2012  5:09 PM  Peri-wound Assessment Intact 02/26/2012  5:09 PM  Wound Length (cm) 7 cm 02/11/2012  3:24 PM  Wound Width (cm) 0.5 cm 02/11/2012  3:24 PM  Margins Attached edges (approximated)  02/11/2012  3:24 PM  Closure None 02/26/2012  5:09 PM  Drainage Amount Scant 02/26/2012  5:09 PM  Drainage Description Serous 02/26/2012  5:09 PM  Non-staged Wound Description Not applicable 02/26/2012  5:09 PM  Treatment Cleansed;Debridement (Selective) 02/26/2012  5:09 PM  Dressing Type Hydrogel;Gauze (Comment);Compression wrap 02/26/2012  5:09 PM  Dressing Changed Changed 02/26/2012  5:09 PM  Dressing Status Clean;Dry;Intact 02/26/2012  5:09 PM   Selective Debridement Selective Debridement - Location: Entire wound bed of all wounds Selective Debridement - Tools Used: Forceps;Other (comment) (qtips) Selective Debridement - Tissue Removed: slough/dry skin   Physical Therapy Assessment and Plan Wound Therapy - Assess/Plan/Recommendations Wound Therapy - Clinical Statement: Wound continues to be dry but dressing easily to remove with saline.  Removed significant amount of dry skin periwound this session. Edema continues, dressed wound with gauze and compression wrap to help reduced swelling.   Wound Plan: Re-eval next session/ Gcode       Goals    Problem List Patient Active Problem List  Diagnosis  . DIABETES MELLITUS, TYPE II, CONTROLLED, WITH COMPLICATIONS  . HYPERLIPIDEMIA  . ANEMIA-NOS  . CATARACT NOS  . HYPERTENSION  . RENAL FAILURE, ACUTE  . KIDNEY DISEASE, CHRONIC, STAGE III  . WEAKNESS  . DYSPNEA ON EXERTION  . CEREBROVASCULAR ACCIDENT, HX OF    GP    Juel Burrow 02/26/2012, 5:18 PM

## 2012-03-02 ENCOUNTER — Ambulatory Visit (HOSPITAL_COMMUNITY)
Admission: RE | Admit: 2012-03-02 | Discharge: 2012-03-02 | Disposition: A | Discharge status: Home / Self Care | Payer: Medicare Other | Source: Ambulatory Visit | Attending: Family Medicine | Admitting: Family Medicine

## 2012-03-02 DIAGNOSIS — S81009A Unspecified open wound, unspecified knee, initial encounter: Secondary | ICD-10-CM | POA: Insufficient documentation

## 2012-03-02 DIAGNOSIS — IMO0001 Reserved for inherently not codable concepts without codable children: Secondary | ICD-10-CM | POA: Insufficient documentation

## 2012-03-02 NOTE — Progress Notes (Signed)
Physical Therapy - Wound Therapy Discharge  Evaluation   Patient Details  Name: Steven [name] MRN: [id number] Date of Birth: [birth year]  Today's Date: 03/02/2012 Time: 4098-1191 Time Calculation (min): 64 min Charges:  Manual 40 Visit#: 8  of 16   Re-eval: 03/30/12  Subjective Subjective Assessment Subjective: No complaints, no pain  Pain Assessment Pain Assessment Pain Assessment: No/denies pain  Wound Therapy Wound 02/05/12 (Active)  Site / Wound Assessment Clean;Dry 03/02/2012  3:50 PM  % Wound base Red or Granulating 100% 03/02/2012  3:50 PM  % Wound base Yellow 0% 03/02/2012  3:50 PM  % Wound base Black 0% 03/02/2012  3:50 PM  % Wound base Other (Comment) 0% 03/02/2012  3:50 PM  Peri-wound Assessment Intact;Edema 03/02/2012  3:50 PM  Wound Length (cm) 0 cm 03/02/2012  3:50 PM  Wound Width (cm) 0 cm 03/02/2012  3:50 PM  Wound Depth (cm) 0 cm 03/02/2012  3:50 PM  Margins Attached edges (approximated) 03/02/2012  3:50 PM  Closure None 02/11/2012  3:24 PM  Drainage Amount None 03/02/2012  3:50 PM  Drainage Description Serous 02/26/2012  5:09 PM  Non-staged Wound Description Not applicable 03/02/2012  3:50 PM  Treatment Cleansed;Other (Comment) 03/02/2012  3:50 PM  Dressing Type Other (Comment) 03/02/2012  3:50 PM  Dressing Changed Changed 02/26/2012  5:09 PM  Dressing Status Clean;Dry;Intact 02/26/2012  5:09 PM     Wound 02/11/12 Contact dermatitis Leg medial aspect  4 different wounds the largest is 7cm x .5 appears backwad L with distal width being 2.5 w no depth.  All wounds covered. (Active)  Site / Wound Assessment Clean;Dry 03/02/2012  3:50 PM  % Wound base Red or Granulating 100% 03/02/2012  3:50 PM  % Wound base Yellow 0% 03/02/2012  3:50 PM  % Wound base Black 0% 03/02/2012  3:50 PM  % Wound base Other (Comment) 0% 03/02/2012  3:50 PM  Peri-wound Assessment Intact;Edema 03/02/2012  3:50 PM  Wound Length (cm) 0 cm 03/02/2012  3:50 PM  Wound Width (cm) 0 cm 03/02/2012  3:50 PM    Wound Depth (cm) 0 cm 03/02/2012  3:50 PM  Margins Attached edges (approximated) 03/02/2012  3:50 PM  Closure None 02/26/2012  5:09 PM  Drainage Amount None 03/02/2012  3:50 PM  Drainage Description Serous 02/26/2012  5:09 PM  Non-staged Wound Description Not applicable 02/26/2012  5:09 PM  Treatment Cleansed;Other (Comment) 03/02/2012  3:50 PM  Dressing Type Other (Comment) 03/02/2012  3:50 PM  Dressing Changed Changed 02/26/2012  5:09 PM  Dressing Status Clean;Dry;Intact 02/26/2012  5:09 PM   R LE measured and bandages applied for Lymphedema.   Date 02/05/2012 03/02/2012   Right Right      MTP 31.7 34.5  4cm 33.50 40.00  8cm 36.50 38.80  12 cm 44.60 42.80  16cm 44.30 38.80  20cm 49.00 42.80  24cm 51.00 44.50  28cm 52.00 44.40  32cm 53.30 45.20  36cm 56.50 45.80  40cm 57.30 44.50  44cm 55.70 47.00  48cm                        Sum of squares 27535.96 21746.35  Total Volume 4782.956 6922.0807   Physical Therapy Assessment and Plan Wound Therapy - Assess/Plan/Recommendations Wound Therapy - Clinical Statement: Wounds now 100%granulated with complete closure, no drainage.  Wounds are healed and will change focus to Lymphedema treatment including MLD and compression bandaging.  Pt with overall fluid reduction of 1,842.89cc since beginning treatment using  standard compression garments.  Most fluid is concentrated into foot and ankle region; After bandage completion,  pt. will then progress to a compression garment. Wound Plan: Discharge woundcare; change focus of treatment to Lymphedema care.      Goals Wound Therapy Goals - Improve the function of patient's integumentary system by progressing the wound(s) through the phases of wound healing by: Decrease Necrotic Tissue - Progress: Met Increase Granulation Tissue to: 100 Increase Granulation Tissue - Progress: Met Decrease Length/Width/Depth by (cm): 5x4x0 Decrease Length/Width/Depth - Progress: Met Improve Drainage Characteristics:  Min Improve Drainage Characteristics - Progress: Met Patient/Family will be able to : verbalize self dressing and importance of compression stocking Patient/Family Instruction Goal - Progress: Partly met Time For Goal Achievement:  (4 wk) Wound Therapy - Potential for Goals: Good  Problem List Patient Active Problem List  Diagnosis  . DIABETES MELLITUS, TYPE II, CONTROLLED, WITH COMPLICATIONS  . HYPERLIPIDEMIA  . ANEMIA-NOS  . CATARACT NOS  . HYPERTENSION  . RENAL FAILURE, ACUTE  . KIDNEY DISEASE, CHRONIC, STAGE III  . WEAKNESS  . DYSPNEA ON EXERTION  . CEREBROVASCULAR ACCIDENT, HX OF    GP Functional Assessment Tool Used: clinical judgement Functional Limitation: Other PT primary Other PT Primary Current Status (Z6109): At least 1 percent but less than 20 percent impaired, limited or restricted Other PT Primary Goal Status (U0454): At least 20 percent but less than 40 percent impaired, limited or restricted Other PT Primary Discharge Status [id number]): At least 1 percent but less than 20 percent impaired, limited or restricted  Steven [name], PTA/CLT 03/02/2012, 4:02 PM

## 2012-03-07 ENCOUNTER — Ambulatory Visit (HOSPITAL_COMMUNITY)
Admission: RE | Admit: 2012-03-07 | Discharge: 2012-03-07 | Disposition: A | Discharge status: Home / Self Care | Payer: Medicare Other | Source: Ambulatory Visit | Attending: Physical Therapy | Admitting: Physical Therapy

## 2012-03-07 NOTE — Progress Notes (Addendum)
Physical Therapy Lymphedema Treatment/ G-Code update Patient Details  Name: Steven [name] MRN: [id number] Date of Birth: 1941/01/03  Today's Date: 03/07/2012 Time: 1600-1720 PT Time Calculation (min): 80 min Visit#: 9  of 16   Re-eval: 03/30/12 Authorization: [employer] Medicare  Authorization Visit#: 9  of 16   Charges:  Manual 60'  Subjective: Symptoms/Limitations Symptoms: Pt. states the wrap was very comfortable on his leg.  No breakdown noted from the bandages  Objective: Manual Therapy Manual Therapy: Other (comment) Other Manual Therapy: MLD to R axillary and inguinal nodes to decrease Lymphedema in R LE f/b compression bandaging using short stretch bandages.  Physical Therapy Assessment and Plan PT Assessment and Plan Clinical Impression Statement: Overall good results with noted reduction of lymph fluid in R LE.  MLD initiated today.  Pt. does have 2 small opening in lateral ankle scar with drainage, approx. 0.5cm2 with 0.2cm depth.  these areas covered with silver and hydrogel.   PT Plan: Continue complete decongestive therapy for R LE.     New goals established by Donnamae Jude PT Goals              STG- 2 weeks  New Goal:  1)  Pt to verbalize precautions for decreasing risk of infection.                     2)  Pt to be I in HEP to promote lymphatic circulation                     3)  Reduce volume by 30%                          LTG-4 weeks                     1)  Reduce volume by 50%                     2) Pt to verbalize the understanding of the maintenance phase of treatment.                     3) Pt to understand where and how to be fitted for compression garment. Problem List Patient Active Problem List  Diagnosis  . DIABETES MELLITUS, TYPE II, CONTROLLED, WITH COMPLICATIONS  . HYPERLIPIDEMIA  . ANEMIA-NOS  . CATARACT NOS  . HYPERTENSION  . RENAL FAILURE, ACUTE  . KIDNEY DISEASE, CHRONIC, STAGE III  . WEAKNESS  . DYSPNEA ON EXERTION  . CEREBROVASCULAR  ACCIDENT, HX OF    PT - End of Session Activity Tolerance: Patient tolerated treatment well General Behavior During Session: [facility] for tasks performed Cognition: [facility] for tasks performed  GP Functional Assessment Tool Used: Clinical Judgement for R LE Lymphedema Functional Limitation: Other PT subsequent Other PT Primary Current Status (W0981): At least 60 percent but less than 80 percent impaired, limited or restricted Other PT Primary Goal Status (X9147): At least 20 percent but less than 40 percent impaired, limited or restricted  [name], PTA/CLT 03/07/2012, 5:53 PM

## 2012-03-10 ENCOUNTER — Ambulatory Visit (HOSPITAL_COMMUNITY)
Admission: RE | Admit: 2012-03-10 | Discharge: 2012-03-10 | Disposition: A | Discharge status: Home / Self Care | Payer: Medicare Other | Source: Ambulatory Visit | Attending: Family Medicine | Admitting: Family Medicine

## 2012-03-10 NOTE — Progress Notes (Addendum)
Physical Therapy Treatment Patient Details  Name: Steven [name] MRN: [id number] Date of Birth: [birth year]  Today's Date: 03/10/2012 Time: 0865-7846 PT Time Calculation (min): 76 min Visit#: 2  of 8   Re-eval: 03/30/12 Charges:  Manual 60' Authorization: [employer] Medicare  Authorization Visit#: 2  of 8    Subjective: Symptoms/Limitations Symptoms: Pt. states wrap has remained comfortable.  States he is not walking as much as he should but pt. encouraged to do so at home.  Objective: Manual Therapy Manual Therapy: Other (comment) Other Manual Therapy: MLD to R axillary and inguinal nodes to decrease Lymphedema in R LE f/b compression bandaging using short stretch bandages. Woundcare to R lateral ankle.  Irrigated/debrided with forceps, dressed with hydrogel/2X2 and conform wrap.  Physical Therapy Assessment and Plan PT Assessment and Plan Clinical Impression Statement: One small opening remains in lateral scar area, however compression is assisting in wound healing.  Continued reduction noted in lymph fluid.  No breakdown of skin or pressure points noted.   PT Plan: Continue complete decongestive therapy for R LE and woundcare.       Problem List Patient Active Problem List  Diagnosis  . DIABETES MELLITUS, TYPE II, CONTROLLED, WITH COMPLICATIONS  . HYPERLIPIDEMIA  . ANEMIA-NOS  . CATARACT NOS  . HYPERTENSION  . RENAL FAILURE, ACUTE  . KIDNEY DISEASE, CHRONIC, STAGE III  . WEAKNESS  . DYSPNEA ON EXERTION  . CEREBROVASCULAR ACCIDENT, HX OF    PT - End of Session Activity Tolerance: Patient tolerated treatment well General Behavior During Session: [facility] for tasks performed Cognition: [facility] for tasks performed   [name], PTA/CLT 03/10/2012, 3:21 PM

## 2012-03-14 ENCOUNTER — Ambulatory Visit (HOSPITAL_COMMUNITY): Payer: Medicare Other | Admitting: Physical Therapy

## 2012-03-17 ENCOUNTER — Ambulatory Visit (HOSPITAL_COMMUNITY)
Admission: RE | Admit: 2012-03-17 | Discharge: 2012-03-17 | Disposition: A | Discharge status: Home / Self Care | Payer: Medicare Other | Source: Ambulatory Visit | Attending: Family Medicine | Admitting: Family Medicine

## 2012-03-17 NOTE — Progress Notes (Addendum)
Physical Therapy Lymphedema / Woundcare Treatment Patient Details  Name: Steven [name] MRN: [id number] Date of Birth: [birth year]  Today's Date: 03/17/2012 Time: 4098-1191 PT Time Calculation (min): 75 min Visit#: 3  of 8   Re-eval: 03/30/12 Authorization: [employer] Medicare  Authorization Visit#: 3  of 8   Charges:  Manual 60'  Subjective: Symptoms/Limitations Symptoms: Pt. reports he had no diffiuculties from the bandages, doing well.   Objective: Manual Therapy Manual Therapy: Other (comment) Other Manual Therapy: MLD to R axillary and inguinal nodes to decrease Lymphedema in R LE f/b compression bandaging using short stretch bandages  Measurements of R LE: Date 02/05/2012 03/02/2012 03/17/2012   Right Right Right       MTP 31.7 34.5 30.00  4cm 33.50 40.00 34.00  8cm 36.50 38.80 35.00  12 cm 44.60 42.80 35.50  16cm 44.30 38.80 34.00  20cm 49.00 42.80 34.80  24cm 51.00 44.50 36.80  28cm 52.00 44.40 39.50  32cm 53.30 45.20 42.00  36cm 56.50 45.80 44.20  40cm 57.30 44.50 46.80  44cm 55.70 47.00 46.00  48cm                              Sum of squares 27535.96 21746.35 17846.66  Total Volume 4782.956 6922.0807 [id number]     Physical Therapy Assessment and Plan PT Assessment and Plan Clinical Impression Statement: Overall decrease of 1,241.31cc since last measurement X 2 weeks ago.  One wound remains in lateral ankle area but increasing in granulation.  Compression is helping achieve wound healing.  Continues to respond nicely to MLD. PT Plan: Continue complete decongestive therapy for R LE and woundcare to lateral ankle.       Problem List Patient Active Problem List  Diagnosis  . DIABETES MELLITUS, TYPE II, CONTROLLED, WITH COMPLICATIONS  . HYPERLIPIDEMIA  . ANEMIA-NOS  . CATARACT NOS  . HYPERTENSION  . RENAL FAILURE, ACUTE  . KIDNEY DISEASE, CHRONIC, STAGE III  . WEAKNESS  . DYSPNEA ON EXERTION  . CEREBROVASCULAR ACCIDENT, HX OF    PT - End of  Session Activity Tolerance: Patient tolerated treatment well General Behavior During Session: [facility] for tasks performed Cognition: [facility] for tasks performed   [name], PTA/CLT 03/17/2012, 5:12 PM

## 2012-03-22 ENCOUNTER — Ambulatory Visit (HOSPITAL_COMMUNITY)
Admission: RE | Admit: 2012-03-22 | Discharge: 2012-03-22 | Disposition: A | Discharge status: Home / Self Care | Payer: Medicare Other | Source: Ambulatory Visit | Attending: Family Medicine | Admitting: Family Medicine

## 2012-03-22 NOTE — Progress Notes (Addendum)
Physical Therapy Treatment Patient Details  Name: Steven [name] MRN: [id number] Date of Birth: 04-17-1941  Today's Date: 03/22/2012 Time: 1420-1540 PT Time Calculation (min): 80 min Visit#: 4  of 8   Re-eval: 04/07/12 Authorization: [employer] Medicare  Authorization Visit#: 4  of 8   Charges:  Manual 40', deb <20cm  Subjective:  Pt. States his leg is feeling much better.  Not having any pain or difficulties just wished the wound would heal.    Objective: Manual Therapy Manual Therapy: Other (comment) Other Manual Therapy: MLD to R axillary and inguinal nodes to decrease Lymphedema in R LE f/b compression bandaging using short stretch bandages  Woundcare to R lateral ankle wound as previous treatment.  Physical Therapy Assessment and Plan PT Assessment and Plan Clinical Impression Statement: Small wound at lateral ankle now 0.7X0.8X 0.2cm, which is larger than previously measured, however callous and undermining debrided from perimeter today opening wound to encourage approximation.  Wound is 100%granulated without undermining or tunneling.  Dressed with hydrogel, 2X2.  Toes have decreased in circumference from last visit and able to keep toe wraps in place.  Pt. will need to get measured next week for compression garment to keep R LE decongested and free from further wounds developing. PT Plan: Continue complete decongestive therapy for R LE and woundcare;  contact orthotist at [facility] to discuss compression garment /setting up appt.       Problem List Patient Active Problem List  Diagnosis  . DIABETES MELLITUS, TYPE II, CONTROLLED, WITH COMPLICATIONS  . HYPERLIPIDEMIA  . ANEMIA-NOS  . CATARACT NOS  . HYPERTENSION  . RENAL FAILURE, ACUTE  . KIDNEY DISEASE, CHRONIC, STAGE III  . WEAKNESS  . DYSPNEA ON EXERTION  . CEREBROVASCULAR ACCIDENT, HX OF    PT - End of Session Activity Tolerance: Patient tolerated treatment well General Behavior During Session: [facility] for tasks  performed Cognition: [facility] for tasks performed   [name], PTA/CLT 03/22/2012, 3:52 PM

## 2012-03-24 ENCOUNTER — Ambulatory Visit (HOSPITAL_COMMUNITY): Payer: Medicare Other | Admitting: Physical Therapy

## 2012-03-29 ENCOUNTER — Ambulatory Visit (HOSPITAL_COMMUNITY)
Admission: RE | Admit: 2012-03-29 | Discharge: 2012-03-29 | Disposition: A | Discharge status: Home / Self Care | Payer: Medicare Other | Source: Ambulatory Visit | Attending: Family Medicine | Admitting: Family Medicine

## 2012-03-29 NOTE — Progress Notes (Signed)
Physical Therapy Treatment Patient Details  Name: Steven [name] MRN: [id number] Date of Birth: 1940-09-06  Today's Date: 03/29/2012 Time: 1435-1530 PT Time Calculation (min): 55 min Visit#: 5  of 8   Re-eval: 04/07/12 Authorization: [employer] Medicare  Authorization Visit#: 5  of 8   Charges:  Manual 45'  Subjective: Symptoms/Limitations Symptoms: Pt. and CG reminded of appt. with Elpidio Galea, orthotist on Thursday and after appt. with therapy.  Pt. missed last appt. due to death in family of CG. Pain Assessment Currently in Pain?: No/denies  Objective: Manual Therapy Manual Therapy: Other (comment) Other Manual Therapy: MLD to R axillary and inguinal nodes to decrease Lymphedema in R LE f/b compression bandaging using short stretch bandages.  Physical Therapy Assessment and Plan PT Assessment and Plan Clinical Impression Statement: Wound at lateral ankle now healed.  Increased scaling/dryness of skin, especially around medial ankle area.  raw area beneath scaled skin at distal shin but no thickness/depth or drainage.  Placed small bandage over area.  Applied compression bandaging.  Next visit may be last if garment received and no signs of wounds. PT Plan: Re-measure next visit.     Problem List Patient Active Problem List  Diagnosis  . DIABETES MELLITUS, TYPE II, CONTROLLED, WITH COMPLICATIONS  . HYPERLIPIDEMIA  . ANEMIA-NOS  . CATARACT NOS  . HYPERTENSION  . RENAL FAILURE, ACUTE  . KIDNEY DISEASE, CHRONIC, STAGE III  . WEAKNESS  . DYSPNEA ON EXERTION  . CEREBROVASCULAR ACCIDENT, HX OF    PT - End of Session Activity Tolerance: Patient tolerated treatment well General Behavior During Session: [facility] for tasks performed Cognition: [facility] for tasks performed   [name], PTA/CLT 03/29/2012, 3:49 PM

## 2012-03-31 ENCOUNTER — Ambulatory Visit (HOSPITAL_COMMUNITY)
Admission: RE | Admit: 2012-03-31 | Discharge: 2012-03-31 | Disposition: A | Discharge status: Home / Self Care | Payer: Medicare Other | Source: Ambulatory Visit | Attending: Family Medicine | Admitting: Family Medicine

## 2012-03-31 NOTE — Progress Notes (Addendum)
Physical Therapy Lymphedema Treatment Patient Details  Name: Steven [name] MRN: [id number] Date of Birth: 06-07-1940  Today's Date: 03/31/2012 Time: 1525-1545 PT Time Calculation (min): 20 min Visit#: 6  of 8   Re-eval: 04/07/12 Charges:  Manual 18' (measurement) Authorization: [facility] Medicare  Authorization Visit#: 6  of 8    Subjective: Symptoms/Limitations Symptoms: Pt. states he received his compression garment and understands how to use it; good fit and comfortable. Pain Assessment Currently in Pain?: No/denies   Objective: R LE Re-measured today    Physical Therapy Assessment and Plan PT Assessment and Plan Clinical Impression Statement: R LE measured again today and with another reduction of 690.64cc of fluid for a total of 3,774.78cc since beginning lymph therapy on 02/05/12.  This is approximately a 57% decrease in total fluid.  All wounds are now healed on R LEand pt. is fitted with a juxta-fit compression garment.  Pt. has met all goals and is now ready for discharge to home maintenance program.  Discussed continuing therapy working on ambulation and strengthening if pt. decides to do so. PT Plan: Discharge to HEP as all goals met.    Goals New goals established by Donnamae Jude PT on 03/07/12 Goals STG- 2 weeks    1) Pt to verbalize precautions for decreasing risk of infection. PROGRESS:  MET   2) Pt to be I in HEP to promote lymphatic circulation.   PROGRESS:  MET   3) Reduce volume by 30%  PROGRESS:  MET LTG-4 weeks    1) Reduce volume by 50%  PROGRESS:  MET   2) Pt to verbalize the understanding of the maintenance phase of treatment.  PROGRESS:  MET   3) Pt to understand where and how to be fitted for compression garment.  PROGRESS:  MET   Problem List Patient Active Problem List  Diagnosis  . DIABETES MELLITUS, TYPE II, CONTROLLED, WITH COMPLICATIONS  . HYPERLIPIDEMIA  . ANEMIA-NOS  . CATARACT NOS  . HYPERTENSION  . RENAL FAILURE, ACUTE  . KIDNEY  DISEASE, CHRONIC, STAGE III  . WEAKNESS  . DYSPNEA ON EXERTION  . CEREBROVASCULAR ACCIDENT, HX OF    PT - End of Session Activity Tolerance: Patient tolerated treatment well General Behavior During Session: [facility] for tasks performed Cognition: [facility] for tasks performed  GP Functional Assessment Tool Used: Clinical Judgement for R LE Lymphedema Functional Limitation: Other PT subsequent Other PT Primary Current Status (V2536): At least 20 percent but less than 40 percent impaired, limited or restricted Other PT Primary Goal Status (U4403): At least 20 percent but less than 40 percent impaired, limited or restricted Other PT Primary Discharge Status [phone]): At least 20 percent but less than 40 percent impaired, limited or restricted  [name], PTA/CLT 03/31/2012, 4:14 PM

## 2012-06-05 ENCOUNTER — Encounter (HOSPITAL_COMMUNITY): Payer: Self-pay | Admitting: Emergency Medicine

## 2012-06-05 ENCOUNTER — Emergency Department (HOSPITAL_COMMUNITY)
Admission: EM | Admit: 2012-06-05 | Discharge: 2012-06-06 | Disposition: A | Discharge status: Home / Self Care | Payer: Medicare Other | Attending: Emergency Medicine | Admitting: Emergency Medicine

## 2012-06-05 DIAGNOSIS — S88119A Complete traumatic amputation at level between knee and ankle, unspecified lower leg, initial encounter: Secondary | ICD-10-CM | POA: Insufficient documentation

## 2012-06-05 DIAGNOSIS — B309 Viral conjunctivitis, unspecified: Secondary | ICD-10-CM

## 2012-06-05 DIAGNOSIS — I2510 Atherosclerotic heart disease of native coronary artery without angina pectoris: Secondary | ICD-10-CM | POA: Insufficient documentation

## 2012-06-05 DIAGNOSIS — R410 Disorientation, unspecified: Secondary | ICD-10-CM

## 2012-06-05 DIAGNOSIS — F22 Delusional disorders: Secondary | ICD-10-CM | POA: Insufficient documentation

## 2012-06-05 DIAGNOSIS — H5789 Other specified disorders of eye and adnexa: Secondary | ICD-10-CM | POA: Insufficient documentation

## 2012-06-05 DIAGNOSIS — E7800 Pure hypercholesterolemia, unspecified: Secondary | ICD-10-CM | POA: Insufficient documentation

## 2012-06-05 DIAGNOSIS — E119 Type 2 diabetes mellitus without complications: Secondary | ICD-10-CM | POA: Insufficient documentation

## 2012-06-05 DIAGNOSIS — F4489 Other dissociative and conversion disorders: Secondary | ICD-10-CM | POA: Insufficient documentation

## 2012-06-05 DIAGNOSIS — Z7982 Long term (current) use of aspirin: Secondary | ICD-10-CM | POA: Insufficient documentation

## 2012-06-05 DIAGNOSIS — I10 Essential (primary) hypertension: Secondary | ICD-10-CM | POA: Insufficient documentation

## 2012-06-05 DIAGNOSIS — Z79899 Other long term (current) drug therapy: Secondary | ICD-10-CM | POA: Insufficient documentation

## 2012-06-05 DIAGNOSIS — B308 Other viral conjunctivitis: Secondary | ICD-10-CM | POA: Insufficient documentation

## 2012-06-05 DIAGNOSIS — Z87891 Personal history of nicotine dependence: Secondary | ICD-10-CM | POA: Insufficient documentation

## 2012-06-05 NOTE — ED Notes (Signed)
Daughter states that patient has not taken his medications in 2 days and been talking "out of his head".

## 2012-06-06 ENCOUNTER — Emergency Department (HOSPITAL_COMMUNITY): Payer: Medicare Other

## 2012-06-06 LAB — CBC WITH DIFFERENTIAL/PLATELET
Eosinophils Absolute: 0.3 10*3/uL (ref 0.0–0.7)
Eosinophils Relative: 4 % (ref 0–5)
Lymphs Abs: 1.4 10*3/uL (ref 0.7–4.0)
MCH: 21.8 pg — ABNORMAL LOW (ref 26.0–34.0)
MCV: 67.4 fL — ABNORMAL LOW (ref 78.0–100.0)
Monocytes Relative: 9 % (ref 3–12)
Platelets: 233 10*3/uL (ref 150–400)
RBC: 5.64 MIL/uL (ref 4.22–5.81)

## 2012-06-06 LAB — BASIC METABOLIC PANEL
BUN: 26 mg/dL — ABNORMAL HIGH (ref 6–23)
Calcium: 9.3 mg/dL (ref 8.4–10.5)
Creatinine, Ser: 1.7 mg/dL — ABNORMAL HIGH (ref 0.50–1.35)
GFR calc non Af Amer: 38 mL/min — ABNORMAL LOW (ref >90)
Glucose, Bld: 143 mg/dL — ABNORMAL HIGH (ref 70–99)
Sodium: 139 mEq/L (ref 135–145)

## 2012-06-06 LAB — RAPID URINE DRUG SCREEN, HOSP PERFORMED
Barbiturates: NOT DETECTED
Tetrahydrocannabinol: NOT DETECTED

## 2012-06-06 LAB — URINALYSIS, ROUTINE W REFLEX MICROSCOPIC
Bilirubin Urine: NEGATIVE
Ketones, ur: NEGATIVE mg/dL
Protein, ur: 100 mg/dL — AB
Urobilinogen, UA: 0.2 mg/dL (ref 0.0–1.0)

## 2012-06-06 LAB — URINE MICROSCOPIC-ADD ON

## 2012-06-06 NOTE — ED Notes (Signed)
No change in pt status.

## 2012-06-06 NOTE — ED Notes (Signed)
sleeping

## 2012-06-06 NOTE — ED Notes (Signed)
Patient transported to CT 

## 2012-06-06 NOTE — ED Provider Notes (Signed)
History     CSN: [id number]  Arrival date & time 06/05/12  2346   First MD Initiated Contact with Patient 06/05/12 2359      Chief Complaint  Patient presents with  . Altered Mental Status    (Consider location/radiation/quality/duration/timing/severity/associated sxs/prior treatment) HPI Steven [name] is a 72 y.o. male who presents to the Emergency Department complaining of change in mental status. Patient lives alone. His daughter , who lives out of town, tried to reach him by phone last night and this morning. She arrived at the house and patient was talking nonsense. He claims to be seeing people that are not there. He has been unable to take his medicines for two days but is unclear as to why he was unable to take it. Patient is answering questions appropriately. He admits that he has seen people before that weren't there. They do not talk to him. They are always the same people who he does not recognize. He denies fever, chills, cough, nausea, vomiting diarrhea.He denies smoking, drinking or talking any drugs. He denies difficulty speaking or swallowing, extremity weakness, falling. Denies suicidal ideation, HI, psychiatric illness.  PCP Dr. Sudie Bailey   Past Medical History  Diagnosis Date  . Amputated below knee   . Coronary artery disease   . Diabetes mellitus   . Hypertension   . Hypercholesteremia     Past Surgical History  Procedure Date  . Amputation     No family history on file.  History  Substance Use Topics  . Smoking status: Former Games developer  . Smokeless tobacco: Not on file  . Alcohol Use: No      Review of Systems  Constitutional: Negative for fever.       10 Systems reviewed and are negative for acute change except as noted in the HPI.  HENT: Negative for congestion.   Eyes: Negative for discharge and redness.  Respiratory: Negative for cough and shortness of breath.   Cardiovascular: Negative for chest pain.  Gastrointestinal: Negative for  vomiting and abdominal pain.  Musculoskeletal: Negative for back pain.  Skin: Negative for rash.  Neurological: Negative for syncope, numbness and headaches.  Psychiatric/Behavioral:       No behavior change.    Allergies  Amlodipine besylate-valsartan  Home Medications   Current Outpatient Rx  Name  Route  Sig  Dispense  Refill  . AMLODIPINE BESYLATE 10 MG PO TABS   Oral   Take 10 mg by mouth daily.         . ASPIRIN 81 MG PO TABS   Oral   Take 81 mg by mouth daily.         . ATORVASTATIN CALCIUM 20 MG PO TABS   Oral   Take 20 mg by mouth daily.         Marland Kitchen CLOPIDOGREL BISULFATE 75 MG PO TABS   Oral   Take 75 mg by mouth daily.         Marland Kitchen DOXAZOSIN MESYLATE 4 MG PO TABS   Oral   Take 4 mg by mouth at bedtime.         Marland Kitchen FERROUS SULFATE 325 (65 FE) MG PO TABS   Oral   Take 325 mg by mouth daily.         Marland Kitchen HYDRALAZINE HCL 100 MG PO TABS   Oral   Take 100 mg by mouth 3 (three) times daily.         Marland Kitchen LISINOPRIL 10 MG PO TABS  Oral   Take 10 mg by mouth daily.         Marland Kitchen METFORMIN HCL 500 MG PO TABS   Oral   Take 500 mg by mouth daily.         Marland Kitchen METOPROLOL TARTRATE 100 MG PO TABS   Oral   Take 200 mg by mouth 2 (two) times daily.         . OXYCODONE-ACETAMINOPHEN 5-325 MG PO TABS   Oral   Take 1-2 tablets by mouth every 8 (eight) hours as needed.         Marland Kitchen PIOGLITAZONE HCL 30 MG PO TABS   Oral   Take 30 mg by mouth daily.           BP 159/72  Pulse 79  Temp 99.5 F (37.5 C) (Oral)  Resp 18  Ht 6' (1.829 m)  Wt 285 lb (129.275 kg)  BMI 38.65 kg/m2  SpO2 93%  Physical Exam  Nursing note and vitals reviewed. Constitutional:       Awake, alert, nontoxic appearance, slightly disheveled.  HENT:  Head: Atraumatic.  Right Ear: External ear normal.       Mucus membranes tacky, poor dentition  Eyes: Right eye exhibits no discharge. Left eye exhibits no discharge.       Eye with watery mattering.  Neck: Neck supple.    Cardiovascular: Normal rate and normal heart sounds.   Pulmonary/Chest: Effort normal and breath sounds normal. He exhibits no tenderness.  Abdominal: Soft. Bowel sounds are normal. There is no tenderness. There is no rebound.  Musculoskeletal: He exhibits no tenderness.       Baseline ROM, no obvious new focal weakness.R AKA with prosthetic  Neurological:       Mental status and motor strength appears baseline for patient and situation.  Skin: No rash noted.  Psychiatric: He has a normal mood and affect.    ED Course  Procedures (including critical care time) Results for orders placed during the hospital encounter of 06/05/12  GLUCOSE, CAPILLARY      Component Value Range   Glucose-Capillary 142 (*) 70 - 99 mg/dL  CBC WITH DIFFERENTIAL      Component Value Range   WBC 8.8  4.0 - 10.5 K/uL   RBC 5.64  4.22 - 5.81 MIL/uL   Hemoglobin 12.3 (*) 13.0 - 17.0 g/dL   HCT 56.2 (*) 13.0 - 86.5 %   MCV 67.4 (*) 78.0 - 100.0 fL   MCH 21.8 (*) 26.0 - 34.0 pg   MCHC 32.4  30.0 - 36.0 g/dL   RDW 78.4 (*) 69.6 - 29.5 %   Platelets 233  150 - 400 K/uL   Neutrophils Relative 71  43 - 77 %   Neutro Abs 6.2  1.7 - 7.7 K/uL   Lymphocytes Relative 16  12 - 46 %   Lymphs Abs 1.4  0.7 - 4.0 K/uL   Monocytes Relative 9  3 - 12 %   Monocytes Absolute 0.8  0.1 - 1.0 K/uL   Eosinophils Relative 4  0 - 5 %   Eosinophils Absolute 0.3  0.0 - 0.7 K/uL   Basophils Relative 0  0 - 1 %   Basophils Absolute 0.0  0.0 - 0.1 K/uL  BASIC METABOLIC PANEL      Component Value Range   Sodium 139  135 - 145 mEq/L   Potassium 3.7  3.5 - 5.1 mEq/L   Chloride 108  96 - 112 mEq/L  CO2 24  19 - 32 mEq/L   Glucose, Bld 143 (*) 70 - 99 mg/dL   BUN 26 (*) 6 - 23 mg/dL   Creatinine, Ser 7.82 (*) 0.50 - 1.35 mg/dL   Calcium 9.3  8.4 - 95.6 mg/dL   GFR calc non Af Amer 38 (*) >90 mL/min   GFR calc Af Amer 45 (*) >90 mL/min  URINALYSIS, ROUTINE W REFLEX MICROSCOPIC      Component Value Range   Color, Urine YELLOW   YELLOW   APPearance CLEAR  CLEAR   Specific Gravity, Urine 1.025  1.005 - 1.030   pH 6.0  5.0 - 8.0   Glucose, UA 100 (*) NEGATIVE mg/dL   Hgb urine dipstick TRACE (*) NEGATIVE   Bilirubin Urine NEGATIVE  NEGATIVE   Ketones, ur NEGATIVE  NEGATIVE mg/dL   Protein, ur 213 (*) NEGATIVE mg/dL   Urobilinogen, UA 0.2  0.0 - 1.0 mg/dL   Nitrite NEGATIVE  NEGATIVE   Leukocytes, UA NEGATIVE  NEGATIVE  URINE RAPID DRUG SCREEN (HOSP PERFORMED)      Component Value Range   Opiates NONE DETECTED  NONE DETECTED   Cocaine NONE DETECTED  NONE DETECTED   Benzodiazepines NONE DETECTED  NONE DETECTED   Amphetamines NONE DETECTED  NONE DETECTED   Tetrahydrocannabinol NONE DETECTED  NONE DETECTED   Barbiturates NONE DETECTED  NONE DETECTED  ETHANOL      Component Value Range   Alcohol, Ethyl (B) <11  0 - 11 mg/dL  URINE MICROSCOPIC-ADD ON      Component Value Range   Squamous Epithelial / LPF RARE  RARE   RBC / HPF 0-2  <3 RBC/hpf    Ct Head Wo Contrast  06/06/2012  *RADIOLOGY REPORT*  Clinical Data: Altered mental status.  CT HEAD WITHOUT CONTRAST  Technique:  Contiguous axial images were obtained from the base of the skull through the vertex without contrast.  Comparison: 07/18/2003  Findings: There is no evidence for acute infarction, intracranial hemorrhage, mass lesion, hydrocephalus, or extra-axial fluid. Moderate atrophy.  Chronic microvascular ischemic change.  Remote left caudate infarct.  Calvarium intact.  No acute sinus or mastoid fluid.  Carotid and vertebral atherosclerosis.  Bilateral cataract extraction.  IMPRESSION: Chronic changes as described.  No acute intracranial findings.   Original Report Authenticated By: Davonna Belling, M.D.     MDM  Patient presents with possible altered mental status. Daughter reports he was confused and talking nonsense. Patient is alert, coherent and able to answer all questions. Labs are unremarkable. CT head without acute findings. Patient has remained alert,  oriented and able to answer questions. Reviewed all results with patient and his daughter. They both agree he can be discharged. Pt stable in ED with no significant deterioration in condition.The patient appears reasonably screened and/or stabilized for discharge and I doubt any other medical condition or other [facility] requiring further screening, evaluation, or treatment in the ED at this time prior to discharge.  MDM Reviewed: nursing note and vitals Interpretation: labs and CT scan           Nicoletta Dress. Colon Branch, MD 06/06/12 [phone]

## 2012-06-12 ENCOUNTER — Inpatient Hospital Stay (HOSPITAL_COMMUNITY)
Admission: AD | Admit: 2012-06-12 | Discharge: 2012-06-17 | DRG: 641 | Disposition: A | Discharge status: Skilled Nursing Facility | Payer: Medicare Other | Attending: Family Medicine | Admitting: Family Medicine

## 2012-06-12 ENCOUNTER — Emergency Department (HOSPITAL_COMMUNITY): Payer: Medicare Other

## 2012-06-12 ENCOUNTER — Encounter (HOSPITAL_COMMUNITY): Payer: Self-pay | Admitting: *Deleted

## 2012-06-12 DIAGNOSIS — N183 Chronic kidney disease, stage 3 unspecified: Secondary | ICD-10-CM | POA: Diagnosis present

## 2012-06-12 DIAGNOSIS — I69998 Other sequelae following unspecified cerebrovascular disease: Secondary | ICD-10-CM

## 2012-06-12 DIAGNOSIS — R5381 Other malaise: Secondary | ICD-10-CM | POA: Diagnosis present

## 2012-06-12 DIAGNOSIS — S88119A Complete traumatic amputation at level between knee and ankle, unspecified lower leg, initial encounter: Secondary | ICD-10-CM

## 2012-06-12 DIAGNOSIS — R809 Proteinuria, unspecified: Secondary | ICD-10-CM | POA: Diagnosis present

## 2012-06-12 DIAGNOSIS — I69959 Hemiplegia and hemiparesis following unspecified cerebrovascular disease affecting unspecified side: Secondary | ICD-10-CM

## 2012-06-12 DIAGNOSIS — E860 Dehydration: Principal | ICD-10-CM | POA: Diagnosis present

## 2012-06-12 DIAGNOSIS — R5383 Other fatigue: Secondary | ICD-10-CM

## 2012-06-12 DIAGNOSIS — N433 Hydrocele, unspecified: Secondary | ICD-10-CM | POA: Diagnosis present

## 2012-06-12 DIAGNOSIS — D573 Sickle-cell trait: Secondary | ICD-10-CM | POA: Diagnosis present

## 2012-06-12 DIAGNOSIS — IMO0002 Reserved for concepts with insufficient information to code with codable children: Secondary | ICD-10-CM

## 2012-06-12 DIAGNOSIS — J310 Chronic rhinitis: Secondary | ICD-10-CM | POA: Diagnosis present

## 2012-06-12 DIAGNOSIS — Z79899 Other long term (current) drug therapy: Secondary | ICD-10-CM

## 2012-06-12 DIAGNOSIS — I129 Hypertensive chronic kidney disease with stage 1 through stage 4 chronic kidney disease, or unspecified chronic kidney disease: Secondary | ICD-10-CM | POA: Diagnosis present

## 2012-06-12 DIAGNOSIS — I739 Peripheral vascular disease, unspecified: Secondary | ICD-10-CM | POA: Diagnosis present

## 2012-06-12 DIAGNOSIS — E119 Type 2 diabetes mellitus without complications: Secondary | ICD-10-CM | POA: Diagnosis present

## 2012-06-12 DIAGNOSIS — B957 Other staphylococcus as the cause of diseases classified elsewhere: Secondary | ICD-10-CM | POA: Diagnosis present

## 2012-06-12 DIAGNOSIS — Z7982 Long term (current) use of aspirin: Secondary | ICD-10-CM

## 2012-06-12 DIAGNOSIS — I2510 Atherosclerotic heart disease of native coronary artery without angina pectoris: Secondary | ICD-10-CM | POA: Diagnosis present

## 2012-06-12 DIAGNOSIS — Z87891 Personal history of nicotine dependence: Secondary | ICD-10-CM

## 2012-06-12 DIAGNOSIS — Z6835 Body mass index (BMI) 35.0-35.9, adult: Secondary | ICD-10-CM

## 2012-06-12 DIAGNOSIS — J40 Bronchitis, not specified as acute or chronic: Secondary | ICD-10-CM | POA: Diagnosis present

## 2012-06-12 DIAGNOSIS — H109 Unspecified conjunctivitis: Secondary | ICD-10-CM | POA: Diagnosis present

## 2012-06-12 DIAGNOSIS — Z9181 History of falling: Secondary | ICD-10-CM

## 2012-06-12 DIAGNOSIS — R609 Edema, unspecified: Secondary | ICD-10-CM | POA: Diagnosis present

## 2012-06-12 LAB — CBC WITH DIFFERENTIAL/PLATELET
Basophils Relative: 0 % (ref 0–1)
Eosinophils Relative: 5 % (ref 0–5)
HCT: 37.4 % — ABNORMAL LOW (ref 39.0–52.0)
Hemoglobin: 12.4 g/dL — ABNORMAL LOW (ref 13.0–17.0)
Lymphs Abs: 1 10*3/uL (ref 0.7–4.0)
MCH: 22.1 pg — ABNORMAL LOW (ref 26.0–34.0)
MCV: 66.8 fL — ABNORMAL LOW (ref 78.0–100.0)
Monocytes Absolute: 1.2 10*3/uL — ABNORMAL HIGH (ref 0.1–1.0)
Monocytes Relative: 17 % — ABNORMAL HIGH (ref 3–12)
Neutro Abs: 4.4 10*3/uL (ref 1.7–7.7)
Platelets: 247 10*3/uL (ref 150–400)
RBC: 5.6 MIL/uL (ref 4.22–5.81)
WBC: 6.9 10*3/uL (ref 4.0–10.5)

## 2012-06-12 LAB — BASIC METABOLIC PANEL
CO2: 25 mEq/L (ref 19–32)
Chloride: 104 mEq/L (ref 96–112)
Creatinine, Ser: 1.71 mg/dL — ABNORMAL HIGH (ref 0.50–1.35)
Glucose, Bld: 96 mg/dL (ref 70–99)

## 2012-06-12 LAB — GLUCOSE, CAPILLARY: Glucose-Capillary: 108 mg/dL — ABNORMAL HIGH (ref 70–99)

## 2012-06-12 MED ORDER — VANCOMYCIN HCL IN DEXTROSE 1-5 GM/200ML-% IV SOLN
INTRAVENOUS | Status: AC
  Filled 2012-06-12: qty 200

## 2012-06-12 MED ORDER — HYDRALAZINE HCL 25 MG PO TABS
100.0000 mg | ORAL_TABLET | Freq: Three times a day (TID) | ORAL | Status: DC
  Administered 2012-06-12 – 2012-06-17 (×14): 100 mg via ORAL
  Filled 2012-06-12 (×5): qty 4
  Filled 2012-06-12: qty 3
  Filled 2012-06-12 (×8): qty 4
  Filled 2012-06-12: qty 1

## 2012-06-12 MED ORDER — ASPIRIN 81 MG PO CHEW
81.0000 mg | CHEWABLE_TABLET | Freq: Every day | ORAL | Status: DC
  Administered 2012-06-12 – 2012-06-17 (×6): 81 mg via ORAL
  Filled 2012-06-12 (×6): qty 1

## 2012-06-12 MED ORDER — LISINOPRIL 10 MG PO TABS
10.0000 mg | ORAL_TABLET | Freq: Every day | ORAL | Status: DC
  Administered 2012-06-12 – 2012-06-17 (×6): 10 mg via ORAL
  Filled 2012-06-12 (×6): qty 1

## 2012-06-12 MED ORDER — METFORMIN HCL 500 MG PO TABS: 500.0000 mg | ORAL_TABLET | Freq: Every day | ORAL | Status: DC

## 2012-06-12 MED ORDER — FERROUS SULFATE 325 (65 FE) MG PO TABS
325.0000 mg | ORAL_TABLET | Freq: Every day | ORAL | Status: DC
  Administered 2012-06-12 – 2012-06-17 (×6): 325 mg via ORAL
  Filled 2012-06-12 (×6): qty 1

## 2012-06-12 MED ORDER — FUROSEMIDE 40 MG PO TABS
40.0000 mg | ORAL_TABLET | Freq: Every day | ORAL | Status: DC
  Administered 2012-06-12: 40 mg via ORAL
  Filled 2012-06-12 (×2): qty 1

## 2012-06-12 MED ORDER — VANCOMYCIN HCL IN DEXTROSE 1-5 GM/200ML-% IV SOLN
1000.0000 mg | Freq: Once | INTRAVENOUS | Status: AC
  Administered 2012-06-12: 1000 mg via INTRAVENOUS
  Filled 2012-06-12: qty 200

## 2012-06-12 MED ORDER — METOPROLOL TARTRATE 50 MG PO TABS
200.0000 mg | ORAL_TABLET | Freq: Two times a day (BID) | ORAL | Status: DC
  Administered 2012-06-12: 200 mg via ORAL
  Filled 2012-06-12 (×2): qty 4

## 2012-06-12 MED ORDER — SODIUM CHLORIDE 0.9 % IV BOLUS (SEPSIS)
1000.0000 mL | Freq: Once | INTRAVENOUS | Status: AC
  Administered 2012-06-12: 1000 mL via INTRAVENOUS

## 2012-06-12 MED ORDER — DOXAZOSIN MESYLATE 2 MG PO TABS
4.0000 mg | ORAL_TABLET | Freq: Every day | ORAL | Status: DC
  Administered 2012-06-12 – 2012-06-16 (×5): 4 mg via ORAL
  Filled 2012-06-12 (×5): qty 2

## 2012-06-12 MED ORDER — ATORVASTATIN CALCIUM 20 MG PO TABS
20.0000 mg | ORAL_TABLET | Freq: Every day | ORAL | Status: DC
  Administered 2012-06-12 – 2012-06-16 (×5): 20 mg via ORAL
  Filled 2012-06-12 (×5): qty 1

## 2012-06-12 MED ORDER — INSULIN ASPART 100 UNIT/ML ~~LOC~~ SOLN: 0.0000 [IU] | Freq: Three times a day (TID) | SUBCUTANEOUS | Status: DC

## 2012-06-12 MED ORDER — SODIUM CHLORIDE 0.9 % IV SOLN
INTRAVENOUS | Status: DC
  Administered 2012-06-12 – 2012-06-14 (×3): via INTRAVENOUS

## 2012-06-12 MED ORDER — ACETAMINOPHEN 500 MG PO TABS
1000.0000 mg | ORAL_TABLET | Freq: Once | ORAL | Status: AC
  Administered 2012-06-12: 1000 mg via ORAL
  Filled 2012-06-12: qty 2

## 2012-06-12 MED ORDER — INSULIN ASPART 100 UNIT/ML ~~LOC~~ SOLN: 0.0000 [IU] | Freq: Every day | SUBCUTANEOUS | Status: DC

## 2012-06-12 MED ORDER — AMLODIPINE BESYLATE 5 MG PO TABS
10.0000 mg | ORAL_TABLET | Freq: Every day | ORAL | Status: DC
  Administered 2012-06-12: 10 mg via ORAL
  Filled 2012-06-12 (×2): qty 2

## 2012-06-12 MED ORDER — SODIUM CHLORIDE 0.9 % IV SOLN
1500.0000 mg | INTRAVENOUS | Status: DC
  Administered 2012-06-13 – 2012-06-16 (×4): 1500 mg via INTRAVENOUS
  Filled 2012-06-12 (×7): qty 1500

## 2012-06-12 MED ORDER — ENOXAPARIN SODIUM 30 MG/0.3ML ~~LOC~~ SOLN
30.0000 mg | SUBCUTANEOUS | Status: DC
  Administered 2012-06-12: 30 mg via SUBCUTANEOUS
  Filled 2012-06-12: qty 0.3

## 2012-06-12 MED ORDER — INSULIN ASPART 100 UNIT/ML ~~LOC~~ SOLN: 4.0000 [IU] | Freq: Three times a day (TID) | SUBCUTANEOUS | Status: DC

## 2012-06-12 MED ORDER — CLOPIDOGREL BISULFATE 75 MG PO TABS
75.0000 mg | ORAL_TABLET | Freq: Every day | ORAL | Status: DC
  Administered 2012-06-13 – 2012-06-17 (×5): 75 mg via ORAL
  Filled 2012-06-12 (×5): qty 1

## 2012-06-12 MED ORDER — ACETAMINOPHEN 500 MG PO TABS
500.0000 mg | ORAL_TABLET | ORAL | Status: DC | PRN
  Administered 2012-06-12 – 2012-06-17 (×13): 500 mg via ORAL
  Filled 2012-06-12 (×7): qty 1
  Filled 2012-06-12: qty 2
  Filled 2012-06-12 (×5): qty 1

## 2012-06-12 NOTE — H&P (Signed)
NAME:  Steven [name], GERHOLD NO.:  [id number]  MEDICAL RECORD NO.:  [id number]  LOCATION:  A315                          FACILITY:  [facility]  PHYSICIAN:  Mila Homer. Sudie Bailey, M.D.DATE OF BIRTH:  1940-06-09  DATE OF ADMISSION:  06/12/2012 DATE OF DISCHARGE:  LH                             HISTORY & PHYSICAL   ADDENDUM:  Physical examination did show a markedly distended scrotum particularly on the right.  This appeared to be fluid.  He also has edema in the distal right leg.  I am adding furosemide 40 mg daily and will recheck a CBC, BMP in the morning.     Mila Homer. Sudie Bailey, M.D.     SDK/MEDQ  D:  06/12/2012  T:  06/12/2012  Job:  [id number]

## 2012-06-12 NOTE — Progress Notes (Signed)
ANTIBIOTIC CONSULT NOTE - INITIAL  Pharmacy Consult for Vancomycin Indication: cellulitis  Allergies  Allergen Reactions  . Amlodipine Besylate-Valsartan     REACTION: renal deterioration (Patient does not recall this allergy. This medication is BRAND NAME EXFORGE which includes NORVASC (Amlodipine) which patient takes daily. Patient is not aware of the reaction, the medication listed, or the allergy itself. Advised patient to verify this medication allergy and reaction with physician.    Patient Measurements: Height: 6' (182.9 cm) Weight: 255 lb (115.667 kg) IBW/kg (Calculated) : 77.6   Vital Signs: Temp: 97.7 F (36.5 C) (01/12 1151) Temp src: Oral (01/12 1151) BP: 144/63 mmHg (01/12 1456) Pulse Rate: 93  (01/12 1456) Intake/Output from previous day:   Intake/Output from this shift:    Labs:  Basename 06/12/12 1311  WBC 6.9  HGB 12.4*  PLT 247  LABCREA --  CREATININE 1.71*   Estimated Creatinine Clearance: 51.3 ml/min (by C-G formula based on Cr of 1.71). No results found for this basename: VANCOTROUGH:2,VANCOPEAK:2,VANCORANDOM:2,GENTTROUGH:2,GENTPEAK:2,GENTRANDOM:2,TOBRATROUGH:2,TOBRAPEAK:2,TOBRARND:2,AMIKACINPEAK:2,AMIKACINTROU:2,AMIKACIN:2, in the last 72 hours   Microbiology: No results found for this or any previous visit (from the past 720 hour(s)).  Medical History: Past Medical History  Diagnosis Date  . Amputated below knee   . Coronary artery disease   . Diabetes mellitus   . Hypertension   . Hypercholesteremia     Medications:  Scheduled:    . [COMPLETED] acetaminophen  1,000 mg Oral Once  . amLODipine  10 mg Oral Daily  . aspirin  81 mg Oral Daily  . atorvastatin  20 mg Oral q1800  . clopidogrel  75 mg Oral Q breakfast  . doxazosin  4 mg Oral QHS  . enoxaparin (LOVENOX) injection  30 mg Subcutaneous Q24H  . ferrous sulfate  325 mg Oral Daily  . hydrALAZINE  100 mg Oral TID  . insulin aspart  0-20 Units Subcutaneous TID WC  . insulin  aspart  0-5 Units Subcutaneous QHS  . insulin aspart  4 Units Subcutaneous TID WC  . lisinopril  10 mg Oral Daily  . metFORMIN  500 mg Oral Q breakfast  . metoprolol  200 mg Oral BID  . [COMPLETED] sodium chloride  1,000 mL Intravenous Once  . vancomycin  1,000 mg Intravenous Once   Followed by  . vancomycin  1,000 mg Intravenous Once   Assessment: 72 yo obese M admitted starting on IV Vancomycin for conjunctivitis. Chronic renal insufficiency noted.   Goal of Therapy:  Vancomycin trough level 10-15 mcg/ml  Plan:  1) Vancomcyin 2gm IV load followed by 1500mg  IV Q24h 2) Check Vancomycin trough at steady state 3) Monitor renal function and cx data  4) Duration of therapy per MD  Elson Clan 06/12/2012,4:57 PM

## 2012-06-12 NOTE — H&P (Signed)
NAME:  Steven [name], Steven [name] NO.:  [id number]  MEDICAL RECORD NO.:  [id number]  LOCATION:  A315                          FACILITY:  [facility]  PHYSICIAN:  Mila Homer. Sudie Bailey, M.D.DATE OF BIRTH:  [birth year]  DATE OF ADMISSION:  06/12/2012 DATE OF DISCHARGE:  LH                             HISTORY & PHYSICAL   This 72 year old presented to the emergency room at [facility] today.  He had had at least 2 weeks of crusty eyes with lids stuck together in the morning and a thick nasal discharge, and would be blowing hunks of material from his nose.  He was seen for similar symptoms and fatigue about a week ago in the emergency room, and discharged home at that time.  He has a long and complicated medical history.  He has had morbid obesity, proteinuria, sickle cell trait, chronic kidney disease with anemia of chronic disease which is probably part kidney, part sickle cell trait, benign essential hypertension, diabetes with peripheral circulatory disorders, peripheral vascular disease, hemiplegia secondary to a CVA in 2001 and a history of personal use of tobacco.  An infection of his left foot led to a left BKA in 2005.  He had a CVA in 2001 as mentioned above.  He smoked from [place] in 1995, almost 40 years.  CURRENT MEDICATIONS: 1. Atorvastatin 20 mg daily. 2. Lisinopril 10 mg daily. 3. Amlodipine 10 mg daily. 4. Metoprolol 100 mg daily. 5. Doxazosin 4 mg daily. 6. Clopidogrel 75 mg daily. 7. Metformin 500 mg q.a.m. 8. Hydralazine 100 mg t.i.d. 9. Aspirin 81 mg daily. 10.Ferrous sulfate 325 daily.  He tells me his appetite has been okay.  He denies chest pain or chest congestion.  He has had no abdominal pain or problem with bowels or bladder.  The patient has been weaker than usual.  He has fallen some a number of times in the last several weeks.  ADMISSION EXAM:  GENERAL:  Shows a morbidly obese gentleman who seems to be alert and oriented. VITAL  SIGNS:  Temperature is 97.7, pulse 93, respiratory rate 22, blood pressure 144/63. HEENT:  His speech is normal.  His sensorium is intact.  He has irritation and injection of both sclerae and he has crustiness of the eyelids.  His anterior nares are clear and he has negative anterior cervical nodes. HEART:  His heart appears to have a regular rhythm and a rate of 90. LUNGS:  His lungs appear clear throughout but there are decreased breath sounds. ABDOMEN:  Soft and obese without organomegaly or mass. EXTREMITIES:  He has a left BKA.  On his right side he has got crustiness, thickening, edema of the distal leg and foot.  He has got significant onychomycosis involving all of the toenails.  Pulses are difficult to palpate.  His chest x-ray shows atherosclerosis of thoracic aorta but no radiographic evidence of acute cardiopulmonary disease.  CT scan of the head compared to CT of June 06, 2012, showed chronic lacunar infarct of left basal ganglia but no acute intracranial problems.  His white cell count was 6900, hemoglobin 12.4.  Differential shows 63% neutrophils, 16 lymphs.  He had a BUN of 25 with  a creatinine of 1.71 and an estimated GFR of 44.  His UA was negative except for trace hemoglobin and 0-2 RBCs per HPF.  His urine tox screen done a week ago was negative.  EKG showed a sinus tachycardia, rate 103 but no ischemia.  The patient is admitted with the following diagnoses:  ADMISSION DIAGNOSES: 1. Mild dehydration. 2. Conjunctivitis and rhinitis, probably bacterial. 3. Type 2 diabetes. 4. Peripheral vascular disease status post left below knee amputation. 5. 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 6. Benign essential hypertension. 7. Sickle cell trait. 8. Morbid obesity. 9. Chronic kidney disease, stage III. 10.Proteinuria.  His current medications will be continued.  A he culture of the eye  is pending.  He will be started on vancomycin which should cover Streptococcus pneumonia and Staphylococcus aureus.  His sugar will be followed with a.c. and h.s. Accu-Cheks and sliding scale insulin.  He will be on a carb modified diet with activity as tolerated.  I have discussed all of this with him.     Mila Homer. Sudie Bailey, M.D.     SDK/MEDQ  D:  06/12/2012  T:  06/12/2012  Job:  [id number]

## 2012-06-12 NOTE — ED Notes (Signed)
Per pt's daughter, pt has swollen (grapefruit sized) testacles and she would like to make the hospitalist aware.

## 2012-06-12 NOTE — ED Provider Notes (Signed)
History     CSN: [id number]  Arrival date & time 06/12/12  1143   First MD Initiated Contact with Patient 06/12/12 1238      Chief Complaint  Patient presents with  . Fall  . Leg Pain    Rt leg pain, pt has a BKA on lt leg    (Consider location/radiation/quality/duration/timing/severity/associated sxs/prior treatment) HPI... has felt poorly for the past 7-10 days. Feels weak, achy, falling a lot at home. Lives independently. Left below the knee amputation. Was seen in the emergency department one week ago and sent home. No chest pain,dyspnea, fever, sweats, chills, dysuria. Nothing makes symptoms better or worse. Severity is moderate.  Past Medical History  Diagnosis Date  . Amputated below knee   . Coronary artery disease   . Diabetes mellitus   . Hypertension   . Hypercholesteremia     Past Surgical History  Procedure Date  . Amputation     History reviewed. No pertinent family history.  History  Substance Use Topics  . Smoking status: Former Games developer  . Smokeless tobacco: Not on file  . Alcohol Use: No      Review of Systems  All other systems reviewed and are negative.    Allergies  Amlodipine besylate-valsartan  Home Medications   Current Outpatient Rx  Name  Route  Sig  Dispense  Refill  . AMLODIPINE BESYLATE 10 MG PO TABS   Oral   Take 10 mg by mouth daily.         . ASPIRIN 81 MG PO TABS   Oral   Take 81 mg by mouth daily.         . ATORVASTATIN CALCIUM 20 MG PO TABS   Oral   Take 20 mg by mouth daily.         Marland Kitchen CLOPIDOGREL BISULFATE 75 MG PO TABS   Oral   Take 75 mg by mouth daily.         Marland Kitchen DOXAZOSIN MESYLATE 4 MG PO TABS   Oral   Take 4 mg by mouth at bedtime.         Marland Kitchen FERROUS SULFATE 325 (65 FE) MG PO TABS   Oral   Take 325 mg by mouth daily.         Marland Kitchen HYDRALAZINE HCL 100 MG PO TABS   Oral   Take 100 mg by mouth 3 (three) times daily.         Marland Kitchen LISINOPRIL 10 MG PO TABS   Oral   Take 10 mg by mouth daily.        Marland Kitchen METFORMIN HCL 500 MG PO TABS   Oral   Take 500 mg by mouth daily.         Marland Kitchen METOPROLOL TARTRATE 100 MG PO TABS   Oral   Take 200 mg by mouth 2 (two) times daily.           BP 144/63  Pulse 93  Temp 97.7 F (36.5 C) (Oral)  Resp 22  Ht 6' (1.829 m)  Wt 255 lb (115.667 kg)  BMI 34.58 kg/m2  SpO2 94%  Physical Exam  Nursing note and vitals reviewed. Constitutional: He is oriented to person, place, and time. He appears well-developed and well-nourished.  HENT:  Head: Normocephalic and atraumatic.  Eyes: EOM are normal. Pupils are equal, round, and reactive to light.       Conjunctivae injected and inflamed  Neck: Normal range of motion. Neck supple.  Cardiovascular: Normal  rate, regular rhythm and normal heart sounds.   Pulmonary/Chest: Effort normal and breath sounds normal.  Abdominal: Soft. Bowel sounds are normal.  Musculoskeletal: Normal range of motion.       Left BKA  Neurological: He is alert and oriented to person, place, and time.  Skin: Skin is warm and dry.  Psychiatric: He has a normal mood and affect.    ED Course  Procedures (including critical care time)  Labs Reviewed  CBC WITH DIFFERENTIAL - Abnormal; Notable for the following:    Hemoglobin 12.4 (*)     HCT 37.4 (*)     MCV 66.8 (*)     MCH 22.1 (*)     Monocytes Relative 17 (*)     Monocytes Absolute 1.2 (*)     All other components within normal limits  BASIC METABOLIC PANEL - Abnormal; Notable for the following:    BUN 25 (*)     Creatinine, Ser 1.71 (*)     GFR calc non Af Amer 38 (*)     GFR calc Af Amer 44 (*)     All other components within normal limits  URINALYSIS, ROUTINE W REFLEX MICROSCOPIC   Ct Head Wo Contrast  06/12/2012  *RADIOLOGY REPORT*  Clinical Data: 72 year old male status post fall.  Weakness and pain.  CT HEAD WITHOUT CONTRAST  Technique:  Contiguous axial images were obtained from the base of the skull through the vertex without contrast.  Comparison:  06/06/2012 and earlier.  Findings: Mild paranasal sinus mucosal thickening is stable.  No scalp hematoma.  Stable orbit soft tissues.  Calvarium intact.  Calcified atherosclerosis at the skull base.  Stable cerebral volume.  No ventriculomegaly.  Small chronic lacunar infarcts of the left basal ganglia appears stable. No midline shift, mass effect, or evidence of mass lesion.  No acute intracranial hemorrhage identified.  No evidence of cortically based acute infarction identified.  No suspicious intracranial vascular hyperdensity.  IMPRESSION: Stable chronic small vessel disease. No acute intracranial abnormality.   Original Report Authenticated By: Erskine Speed, M.D.      1. Failure to thrive       MDM  IV fluids, screening tests.  discussed with daughter. She feels he is unable to take care of himself.  admit to Dr. Veleta Miners, MD 06/12/12 1520

## 2012-06-12 NOTE — ED Notes (Addendum)
Pt has BKA lt leg, was trying to ambulate to wheelchair at 5am and fell on rt side, co rt leg pain and generalized soreness, pt states has been falling with no knowledge of fall until after the fact for several weeks.

## 2012-06-12 NOTE — ED Notes (Signed)
Called for report, nurse to call back

## 2012-06-13 ENCOUNTER — Inpatient Hospital Stay (HOSPITAL_COMMUNITY): Payer: Medicare Other

## 2012-06-13 LAB — BASIC METABOLIC PANEL
BUN: 26 mg/dL — ABNORMAL HIGH (ref 6–23)
GFR calc non Af Amer: 32 mL/min — ABNORMAL LOW (ref >90)
Glucose, Bld: 138 mg/dL — ABNORMAL HIGH (ref 70–99)
Potassium: 3.7 mEq/L (ref 3.5–5.1)

## 2012-06-13 LAB — GLUCOSE, CAPILLARY
Glucose-Capillary: 128 mg/dL — ABNORMAL HIGH (ref 70–99)
Glucose-Capillary: 132 mg/dL — ABNORMAL HIGH (ref 70–99)

## 2012-06-13 LAB — URINALYSIS, ROUTINE W REFLEX MICROSCOPIC
Glucose, UA: 100 mg/dL — AB
Ketones, ur: NEGATIVE mg/dL
Leukocytes, UA: NEGATIVE
pH: 6.5 (ref 5.0–8.0)

## 2012-06-13 LAB — CBC
HCT: 32.2 % — ABNORMAL LOW (ref 39.0–52.0)
Hemoglobin: 10.5 g/dL — ABNORMAL LOW (ref 13.0–17.0)
MCHC: 32.6 g/dL (ref 30.0–36.0)

## 2012-06-13 LAB — URINE MICROSCOPIC-ADD ON

## 2012-06-13 MED ORDER — METOPROLOL TARTRATE 50 MG PO TABS
100.0000 mg | ORAL_TABLET | Freq: Two times a day (BID) | ORAL | Status: DC
  Administered 2012-06-13 – 2012-06-17 (×9): 100 mg via ORAL
  Filled 2012-06-13 (×8): qty 2

## 2012-06-13 MED ORDER — ENOXAPARIN SODIUM 40 MG/0.4ML ~~LOC~~ SOLN
40.0000 mg | SUBCUTANEOUS | Status: DC
  Administered 2012-06-13 – 2012-06-16 (×4): 40 mg via SUBCUTANEOUS
  Filled 2012-06-13 (×4): qty 0.4

## 2012-06-13 MED ORDER — FUROSEMIDE 20 MG PO TABS
20.0000 mg | ORAL_TABLET | Freq: Every day | ORAL | Status: DC
  Administered 2012-06-14: 20 mg via ORAL
  Filled 2012-06-13: qty 1

## 2012-06-13 NOTE — Progress Notes (Signed)
UR Chart Review Completed  

## 2012-06-13 NOTE — Care Management Note (Signed)
    Page 1 of 1   06/17/2012     11:07:42 AM   CARE MANAGEMENT NOTE 06/17/2012  Patient:  Steven [name], Steven [name]   Account Number:  [id number]  Date Initiated:  06/13/2012  Documentation initiated by:  Sharrie Rothman  Subjective/Objective Assessment:   Pt admitted from home with cellulitis, dehydration, and failure to thrive. Pt lives alone but has a daughter who checks on pt daily. Pt also has a friend who checks on him daily. Pt has a cane, walker, and 2 wheelchair.     Action/Plan:   Pt may benefit from [facility] at discharge. Pt wants to return home at discharge but is agreeable to rehab if needed. Pt is left bka and has a prosthesis. Will continue to follow.   Anticipated DC Date:  06/16/2012   Anticipated DC Plan:  HOME W HOME HEALTH SERVICES      DC Planning Services  CM consult      Choice offered to / List presented to:             Status of service:  Completed, signed off Medicare Important Message given?  YES (If response is "NO", the following Medicare IM given date fields will be blank) Date Medicare IM given:  06/17/2012 Date Additional Medicare IM given:    Discharge Disposition:  SKILLED NURSING FACILITY  Per UR Regulation:    If discussed at Long Length of Stay Meetings, dates discussed:    Comments:  06/17/12 1106 Arlyss Queen, RN BSN CM Pt discharged to [facility] in [place]. CSW to arrange discharge to facility.  06/16/12 1440 Arlyss Queen, RN BSN CM Pt now agrees to [facility] in [place]. CSW is aware and will conduct bed search.  06/16/12 1245 Arlyss Queen, RN BSN CM Referral made to [facility]. [facility] stated that pt is not appropriate for their facility. Discussed with pt. Pt wants to return home with [facility].  06/13/12 1430 Arlyss Queen, RN BSN CM

## 2012-06-13 NOTE — Progress Notes (Signed)
NAME:  QUENCY, TOBER NO.:  [id number]  MEDICAL RECORD NO.:  [id number]  LOCATION:  A315                          FACILITY:  [facility]  PHYSICIAN:  Mila Homer. Sudie Bailey, M.D.DATE OF BIRTH:  18-May-1941  DATE OF PROCEDURE: DATE OF DISCHARGE:                                PROGRESS NOTE   SUBJECTIVE:  This 72 year old was admitted to the hospital last night with dehydration and what appeared to be a staph conjunctivitis.  He also had massive swelling of his testicle.  He feels somewhat better today.  He cannot tell me how long the swelling of the testicle has gone on, however.  OBJECTIVE:  VITAL SIGNS:  Temperature 99.1, pulse 74, respiratory rate 20, blood pressure 103/47.  Nursing tells me his blood pressure really has been running low the last few times it has been checked. GENERAL:  He is sitting up in bed.  He appears to be alert. LUNGS:  His lungs show decreased breath sounds throughout but he is moving air well. HEART:  Has a regular rhythm, rate of about 70. HEENT:  There is less irritation of the sclerae. EXTREMITIES:  He has a left BKA and thickening and scaling with some swelling and decreased blood supply distal right leg and foot. GU:  He still has what appears to be edema of the testicle.  Today's BUN is 26, creatinine 1.97, somewhat worse than yesterday.  His white cell count 6600, hemoglobin 10.5 with an MCV of 67, consistent with his sickle cell trait.  His UA was negative.  Sugar 138.  ASSESSMENT: 1. Mild dehydration. 2. Conjunctivitis and rhinitis felt to be probably caused by staph. 3. Edema. 4. Peripheral vascular disease status post left below the knee     amputation. 5. Sickle cell trait. 6. Benign essential hypertension. 7. Chronic kidney disease, stage III. 8. Mild hypotension today.  PLAN:  I decreased his furosemide from 40 mg to 20 mg, decreased his metoprolol from 200 mg b.i.d. to 100 mg b.i.d., and stopped his amlodipine at  present.  We will follow his blood pressures today and continue his other antihypertensives including hydralazine.  I have asked Urology to see him.     Mila Homer. Sudie Bailey, M.D.     SDK/MEDQ  D:  06/13/2012  T:  06/13/2012  Job:  [id number]

## 2012-06-14 ENCOUNTER — Inpatient Hospital Stay (HOSPITAL_COMMUNITY): Payer: Medicare Other

## 2012-06-14 LAB — GLUCOSE, CAPILLARY
Glucose-Capillary: 137 mg/dL — ABNORMAL HIGH (ref 70–99)
Glucose-Capillary: 137 mg/dL — ABNORMAL HIGH (ref 70–99)

## 2012-06-14 LAB — BASIC METABOLIC PANEL
CO2: 28 mEq/L (ref 19–32)
Glucose, Bld: 127 mg/dL — ABNORMAL HIGH (ref 70–99)
Potassium: 3.6 mEq/L (ref 3.5–5.1)
Sodium: 140 mEq/L (ref 135–145)

## 2012-06-14 NOTE — Consult Note (Signed)
[id number]

## 2012-06-14 NOTE — Progress Notes (Signed)
I talked to a good friend of his, who came to the office today.  Apparently, before he was admitted to the hospital this weekend he did become very weak and also had developed pain in his right foot, which made it impossible for him to walk.   Given this information, I am getting an x-ray of his right foot and also repeating his CBC and sedimentation rate in the morning.  He is currently on vancomycin, which should cover many infections but may not cover anaerobic infections adequately.  Mila Homer. Sudie Bailey MD

## 2012-06-15 LAB — BASIC METABOLIC PANEL
BUN: 20 mg/dL (ref 6–23)
Calcium: 8.4 mg/dL (ref 8.4–10.5)
Chloride: 105 mEq/L (ref 96–112)
Creatinine, Ser: 1.74 mg/dL — ABNORMAL HIGH (ref 0.50–1.35)
GFR calc Af Amer: 43 mL/min — ABNORMAL LOW (ref >90)
GFR calc non Af Amer: 37 mL/min — ABNORMAL LOW (ref >90)

## 2012-06-15 LAB — EYE CULTURE

## 2012-06-15 LAB — CBC
HCT: 32.7 % — ABNORMAL LOW (ref 39.0–52.0)
MCH: 21.7 pg — ABNORMAL LOW (ref 26.0–34.0)
MCV: 67.6 fL — ABNORMAL LOW (ref 78.0–100.0)
RDW: 15.4 % (ref 11.5–15.5)
WBC: 6.2 10*3/uL (ref 4.0–10.5)

## 2012-06-15 LAB — GLUCOSE, CAPILLARY
Glucose-Capillary: 108 mg/dL — ABNORMAL HIGH (ref 70–99)
Glucose-Capillary: 131 mg/dL — ABNORMAL HIGH (ref 70–99)

## 2012-06-15 MED ORDER — SODIUM CHLORIDE 0.9 % IJ SOLN
3.0000 mL | INTRAMUSCULAR | Status: DC | PRN
  Administered 2012-06-15 (×3): 3 mL via INTRAVENOUS

## 2012-06-15 MED ORDER — FUROSEMIDE 40 MG PO TABS
40.0000 mg | ORAL_TABLET | Freq: Every day | ORAL | Status: DC
  Administered 2012-06-15 – 2012-06-17 (×3): 40 mg via ORAL
  Filled 2012-06-15 (×3): qty 1

## 2012-06-15 NOTE — Clinical Social Work Psychosocial (Deleted)
Clinical Social Work Department BRIEF PSYCHOSOCIAL ASSESSMENT 06/15/2012  Patient:  Steven [name]     Account Number:  [id number]     Admit date:  06/14/2012  Clinical Social Worker:  Nancie Neas  Date/Time:  06/15/2012 02:25 PM  Referred by:  CSW  Date Referred:  06/15/2012 Referred for  ALF Placement   Other Referral:   Interview type:  Family Other interview type:   Fayrene Fearing- son    PSYCHOSOCIAL DATA Living Status:  FACILITY Admitted from facility:  [facility] Level of care:  Assisted Living Primary support name:  Fayrene Fearing Primary support relationship to patient:  CHILD, ADULT Degree of support available:   supportive    CURRENT CONCERNS Current Concerns  Post-Acute Placement   Other Concerns:    SOCIAL WORK ASSESSMENT / [name] CSW met with pt's sons at bedside. Pt in chair but did not participate in assessment. Pt has been resident at [facility] for about 4 years. She was on AL unit but moved to memory care after walking out of facility. Sons report pt started showing signs of dementia before entering [facility]. Family appear to be involved and supportive. Fayrene Fearing is HCPOA. He reports they understand need for locked unit, but have a hard time seeing pt more restricted. CSW spoke with Marchelle Folks at facility who said pt fell yesterday when she missed the chair while trying to sit down. Pt is an extensive assist with most ADLs, but can feed herself with limited assist. Pt was not receiving home health services PTA but facility has in house home health if needed at d/c. Okay to return per [place].   Assessment/[name] status:  Psychosocial Support/Ongoing Assessment of Needs Other assessment/ [name]:   Information/referral to community resources:   [employer]    PATIENT'S/FAMILY'S RESPONSE TO [name] OF CARE: Pt unable to discuss [name] of care. Family would like pt to return to [facility] if she continues to be ALF appropriate. CSW explained if needed will  have facility assess pt and will follow and discuss higher level of care if necessary.        Derenda Fennel, [place] [phone]

## 2012-06-15 NOTE — Evaluation (Signed)
Physical Therapy Evaluation Patient Details Name: Steven [name] MRN: [id number] DOB: Oct 26, 1940 Today's Date: 06/15/2012 Time: 7829-5621 PT Time Calculation (min): 58 min  PT Assessment / Plan / Recommendation Clinical Impression  Pt very I s/p L BKA who was I donning/doffing and ambulating in his own home as well as doing his own cleaning and cooking who states he has been decreasing in strength for the past two weeks.  He states he began to fall and therefore came to the ER.  Pt will benefit from skilled PT to improve functional ability and increase safely.    PT Assessment  Patient needs continued PT services    Follow Up Recommendations  CIR    Does the patient have the potential to tolerate intense rehabilitation    yes-worked with patient for a full hour today without difficulty  Barriers to Discharge  Lives alone      Equipment Recommendations  None recommended by PT    Recommendations for Other Services Rehab consult   Frequency Min 5X/week    Precautions / Restrictions Precautions Precautions: Fall Restrictions Weight Bearing Restrictions: No       Mobility  Bed Mobility Bed Mobility: Rolling Right;Right Sidelying to Sit Rolling Right: 6: Modified independent (Device/Increase time) Right Sidelying to Sit: 3: Mod assist Transfers Transfers: Sit to Stand Sit to Stand: 3: Mod assist Details for Transfer Assistance: side step with walker and prothesis Ambulation/Gait Ambulation/Gait Assistance: 4: Min guard Ambulation Distance (Feet): 4 Feet (side step only) Assistive device: Rolling walker       Exercises General Exercises - Lower Extremity Ankle Circles/Pumps: Strengthening;Both;10 reps Quad Sets: Strengthening;Both;10 reps Gluteal Sets: Strengthening;Both;10 reps Short Arc Quad: Strengthening;Both;10 reps Heel Slides: Strengthening;Both;10 reps Hip ABduction/ADduction: Strengthening;Both;10 reps Straight Leg Raises: Strengthening;Both;10 reps Other  Exercises Other Exercises: worked on Donning/Doffing prothesis   PT Diagnosis: Difficulty walking;Generalized weakness;Acute pain  PT Problem List: Decreased strength;Decreased activity tolerance;Decreased balance;Decreased mobility;Decreased range of motion PT Treatment Interventions: Gait training;Functional mobility training;Therapeutic activities;Therapeutic exercise   PT Goals Acute Rehab PT Goals PT Goal Formulation: With patient Time For Goal Achievement: 06/17/12 Potential to Achieve Goals: Good Pt will Roll Supine to Right Side: with modified independence PT Goal: Rolling Supine to Right Side - Progress: Goal set today Pt will Roll Supine to Left Side: with modified independence PT Goal: Rolling Supine to Left Side - Progress: Goal set today Pt will go Supine/Side to Sit: with min assist PT Goal: Supine/Side to Sit - Progress: Goal set today Pt will go Sit to Supine/Side: with min assist PT Goal: Sit to Supine/Side - Progress: Goal set today Pt will go Sit to Stand: with min assist PT Goal: Sit to Stand - Progress: Goal set today Pt will Ambulate: 1 - 15 feet;with min assist;with rolling walker PT Goal: Ambulate - Progress: Goal set today  Visit Information  Last PT Received On: 06/15/12    Subjective Data  Subjective: I do everything for myself at home but I've been getting weaker for the past two weeks and have began falling.  I need to get stronger. Patient Stated Goal: To be able to walk again.   Prior Functioning  Home Living Lives With: Alone Available Help at Discharge: Friend(s) Type of Home: House Home Access: Ramped entrance Home Layout: One level Bathroom Shower/Tub: Engineer, manufacturing systems: Standard Home Adaptive Equipment: Bedside commode/3-in-1;Tub transfer bench;Walker - rolling;Wheelchair - powered;Wheelchair - manual Prior Function Level of Independence: Independent with assistive device(s) Able to Take Stairs?: No Driving: No Vocation:  Retired Musician: No difficulties    Cognition  Overall Cognitive Status: Appears within functional limits for tasks assessed/performed Arousal/Alertness: Awake/alert Orientation Level: Appears intact for tasks assessed    Extremity/Trunk Assessment Right Lower Extremity Assessment RLE ROM/Strength/Tone: Deficits RLE ROM/Strength/Tone Deficits: decreased strength generally 2/5 Left Lower Extremity Assessment LLE ROM/Strength/Tone: Deficits (BKA strength generally 3+)   Balance    End of Session PT - End of Session Equipment Utilized During Treatment: Gait belt Activity Tolerance: Patient tolerated treatment well Patient left: in bed;with call bell/phone within reach  GP     Tiaira Arambula,CINDY 06/15/2012, 2:39 PM

## 2012-06-15 NOTE — Consult Note (Signed)
NAME:  Steven [name], Steven [name] NO.:  [id number]  MEDICAL RECORD NO.:  [id number]  LOCATION:  A315                          FACILITY:  [facility]  PHYSICIAN:  Ky Barban, M.D.DATE OF BIRTH:  [birth year]  DATE OF CONSULTATION: DATE OF DISCHARGE:                                CONSULTATION   CHIEF COMPLAINT:  Scrotal edema.  HISTORY:  A 72 year old gentleman who is admitted by Dr. Sudie Bailey with staph conjunctivitis.  I was called in to see him because he has marked scrotal swelling.  He has no urinary complaints and I on examination found out that he has marked edema of his lower extremities.  His only leg which is right leg has at least 2 to 3+ pitting edema, and I think it is part of his generalized anasarca and that fluid is going into the scrotum.  When the fluid from the leg goes down, the scrotal fluid will go down also.  He is already on diuretics.  PHYSICAL EXAMINATION:  GENERAL:  Moderately built male, not in acute distress, fully conscious, alert, oriented. VITAL SIGNS:  Blood pressure is 103/47, temperature is 98.6, pulse is 66 per minute, respiration 27 per minute. ABDOMEN:  Soft, flat.  Liver, spleen, kidneys not palpable. GENITOURINARY:  External genitalia has marked edema of the scrotum, looks like the edema is going down because I can see the skin showing signs of wrinkles and has condom catheter in place. EXTREMITIES:  There is 2+ edema of his right leg.  He has left BK amputation from previous surgery.  IMPRESSION:  Scrotal edema.  His other medical problem include conjunctivitis probably staph rhinitis.  Peripheral vascular disease status post left BK amputation, sickle cell trait, benign essential hypertension, chronic kidney disease, stage III.  I think I will leave it up to Dr. Sudie Bailey.  Continue using diuretic and when the edema of his leg goes down, scrotal edema should go down also. No further urological workup is needed at this  point.  His lab workup; sodium is 140, potassium 3.6, chloride 107, CO2 is 28, BUN is 23, creatinine 1.99.  He had a renal ultrasound done in 2008, which I reviewed, the report.  At that time, his kidneys showed no hydronephrosis, no calcification.  It was normal renal ultrasound.  I appreciate Dr. Sudie Bailey for letting me see this patient.     Ky Barban, M.D.     MIJ/MEDQ  D:  06/14/2012  T:  06/15/2012  Job:  [id number]

## 2012-06-15 NOTE — Progress Notes (Signed)
NAME:  Steven [name], Steven [name]                  ACCOUNT NO.:  MEDICAL RECORD NO.:  [id number]  LOCATION:                                 FACILITY:  PHYSICIAN:  Mila Homer. Sudie Bailey, M.D.DATE OF BIRTH:  15-Sep-1940  DATE OF PROCEDURE: DATE OF DISCHARGE:                                PROGRESS NOTE   SUBJECTIVE:  He is generally feeling better.  OBJECTIVE:  VITAL SIGNS:  Temperature is 97.8, pulse 65, respiratory rate 20, blood pressure 148/74.  He is semi-recumbent in bed.  He is awake and alert, and oriented. HEENT:  There is much less erythema to the sclerae. LUNGS:  Decreased breath sounds throughout with occasional rhonchi. HEART:  Regular rhythm.  Rate of about 70.   His BUN was 23 with a creatinine 1.99.  His eye culture is growing out Staph aureus.  Sensitivities are pending.  ASSESSMENT: 1. Staph aureus conjunctivitis, rhinitis and possibly bronchitis. 2. Mild dehydration. 3. Hypertension, stable on lower dose of medication. 4. Edema. 5. Sickle cell trait. 6. Chronic kidney disease.  PLAN:  Continue with his current medications including lower dose antihypertensive and vancomycin IV.  Decrease the IV fluids from 50 mL an hour to [facility].     Mila Homer. Sudie Bailey, M.D.     SDK/MEDQ  D:  06/14/2012  T:  06/15/2012  Job:  [id number]

## 2012-06-16 LAB — GLUCOSE, CAPILLARY
Glucose-Capillary: 98 mg/dL (ref 70–99)
Glucose-Capillary: 110 mg/dL — ABNORMAL HIGH (ref 70–99)
Glucose-Capillary: 144 mg/dL — ABNORMAL HIGH (ref 70–99)
Glucose-Capillary: 184 mg/dL — ABNORMAL HIGH (ref 70–99)

## 2012-06-16 NOTE — Clinical Social Work Placement (Signed)
Clinical Social Work Department CLINICAL SOCIAL WORK PLACEMENT NOTE 06/16/2012  Patient:  Steven [name], Steven [name]  Account Number:  [id number] Admit date:  06/12/2012  Clinical Social Worker:  Derenda Fennel, LCSW  Date/time:  06/16/2012 03:05 PM  Clinical Social Work is seeking post-discharge placement for this patient at the following level of care:   SKILLED NURSING   (*CSW will update this form in Epic as items are completed)   06/16/2012  Patient/family provided with [facility] Department of Clinical Social Work's list of facilities offering this level of care within the geographic area requested by the patient (or if unable, by the patient's family).  06/16/2012  Patient/family informed of their freedom to choose among providers that offer the needed level of care, that participate in Medicare, Medicaid or managed care program needed by the patient, have an available bed and are willing to accept the patient.  06/16/2012  Patient/family informed of [facility]' ownership interest in [facility], as well as of the fact that they are under no obligation to receive care at this facility.  PASARR submitted to EDS on  PASARR number received from E[employer] on   FL2 transmitted to all facilities in geographic area requested by pt/family on  06/16/2012 FL2 transmitted to all facilities within larger geographic area on 06/16/2012  Patient informed that his/her managed care company has contracts with or will negotiate with  certain facilities, including the following:     Patient/family informed of bed offers received:   Patient chooses bed at  Physician recommends and patient chooses bed at    Patient to be transferred to  on   Patient to be transferred to facility by   The following physician request were entered in Epic:   Additional Comments: existing pasarr  Derenda Fennel, LCSW [phone]

## 2012-06-16 NOTE — Progress Notes (Signed)
NAME:  Steven [name], OVERHOLSER NO.:  [id number]  MEDICAL RECORD NO.:  [id number]  LOCATION:                                 FACILITY:  PHYSICIAN:  Mila Homer. Sudie Bailey, M.D.DATE OF BIRTH:  Steven [name]  DATE OF PROCEDURE:  06/15/2012 DATE OF DISCHARGE:                                PROGRESS NOTE   SUBJECTIVE:  Still feels like he is congested in his head, in his lung.  OBJECTIVE:  VITAL SIGNS:  Temperature is 98.4, pulse 62, respiratory rate 20, blood pressure 179/82, but has been 120/84 in the last 24 hours. LUNGS:  Actually fairly clear throughout and he is moving air well. HEART:  Regular rhythm, rate of about 70. EXTREMITIES:  He has a scaling and swelling of distal right leg.  White cell count today is 6,200, hemoglobin 10.5, BUN 20, creatinine 1.74.  His sed rate was 30, glucose 128.  X-ray of his right foot showed osteopenia and some peripheral atherosclerotic changes.  Ultrasound of the testicles showed really normal testicles and bilateral hydronephrosis.  ASSESSMENT: 1. Staphylococcal conjunctivitis, rhinitis and bronchitis. 2. Edema including scrotal edema. 3. Bilateral hydroceles. 4. Peripheral vascular disease, status post below-the-knee amputation. 5. Venous stasis changes of the right leg, improved based on history. 6. Sickle cell trait. 7. Chronic kidney disease, stage III.  PLAN:  His IV will be discontinued today and he will be evaluated by Physical Therapy.  I am increasing his furosemide to 40 mg today.     Mila Homer. Sudie Bailey, M.D.     SDK/MEDQ  D:  06/15/2012  T:  06/15/2012  Job:  [id number]

## 2012-06-16 NOTE — Progress Notes (Signed)
Physical Therapy Treatment Patient Details Name: Rino Hosea MRN: [id number] DOB: [birth year] Today's Date: 06/16/2012 Time: 4098-1191 PT Time Calculation (min): 33 min  PT Assessment / Plan / Recommendation Comments on Treatment Session  Pt has more difficulty with bed mobility with HOB flat and rolling to R today, required max cueing for appropriate sequencing.  Requires min A for LE dressing.  Requires A for pericare.  Attempts all mobility in order to improve strength.  Pt states that he has become very weak the last few weeks and wishes to get stronger to continue with overall independence and decrease burden of care to family and friends.     Follow Up Recommendations        Does the patient have the potential to tolerate intense rehabilitation     Barriers to Discharge        Equipment Recommendations       Recommendations for Other Services    Frequency     Plan Discharge plan remains appropriate    Precautions / Restrictions Precautions Precautions: Fall Restrictions Weight Bearing Restrictions: No   Pertinent Vitals/Pain Pain w/palpation to R LE due to gout.  FACES: 6/10    Mobility  Bed Mobility Bed Mobility: Rolling Left;Right Sidelying to Sit Rolling Left: 4: Min assist Right Sidelying to Sit: 2: Max assist Details for Bed Mobility Assistance: w/max cueing for UE and LE placement Transfers Transfers: Sit to Stand;Stand to Sit Sit to Stand: From bed;From elevated surface;4: Min assist Stand to Sit: To bed;To chair/3-in-1;4: Min guard Details for Transfer Assistance: cueing for UE placement.   PLOF: Pt uses RW to pull up, able to demonstrate with supervision from elevated surface.  Ambulation/Gait Ambulation/Gait Assistance: 4: Min guard Ambulation Distance (Feet): 4 Feet Assistive device: Rolling walker Gait Pattern: Trunk flexed;Step-to pattern Gait velocity: decreased    Exercises     PT Diagnosis:    PT Problem List:   PT Treatment  Interventions:     PT Goals Acute Rehab PT Goals PT Goal Formulation: With patient Time For Goal Achievement: 06/17/12 Potential to Achieve Goals: Good Pt will Roll Supine to Right Side: with modified independence Pt will Roll Supine to Left Side: with modified independence Pt will go Supine/Side to Sit: with min assist Pt will go Sit to Supine/Side: with min assist Pt will go Sit to Stand: with min assist Pt will Ambulate: 1 - 15 feet;with min assist;with rolling walker  Visit Information  Last PT Received On: 06/16/12 PT/OT Co-Evaluation/Treatment: Yes    Subjective Data  Subjective: Pt states in the past 2 weeks he falls about 6x a day, has to call his friend to help him off the ground.    Cognition       Balance  Balance Balance Assessed: Yes Dynamic Sitting Balance Dynamic Sitting - Comments: donning on LE prothesis and shoe w/min A, ptable to pull R LE onto bed to don, needed A for shoe.   End of Session PT - End of Session Equipment Utilized During Treatment: Gait belt Activity Tolerance: Patient tolerated treatment well Patient left: in chair;with call bell/phone within reach   GP     Juel Ripley 06/16/2012, 8:59 AM

## 2012-06-16 NOTE — Progress Notes (Signed)
NAME:  Steven [name], MOUNTS NO.:  [id number]  MEDICAL RECORD NO.:  [id number]  LOCATION:  A315                          FACILITY:  [facility]  PHYSICIAN:  Mila Homer. Sudie Bailey, M.D.DATE OF BIRTH:  07/09/1940  DATE OF PROCEDURE: DATE OF DISCHARGE:                                PROGRESS NOTE   SUBJECTIVE:  He feels a good deal better today.  OBJECTIVE:  GENERAL:  He is sitting up in a chair.  His speech is normal.  His sensorium is intact.  He is in no acute distress. VITAL SIGNS:  Temperature is 98.2, pulse 62, respiratory rate 20, blood pressure 179/71, with a low at 155/69 in the last 24 hours. LUNGS:  His lungs are clear throughout.  He is moving air well. HEART:  His heart is regular rhythm.  Rate of about 70. HEENT:  There is no longer any sign of conjunctivitis. EXTREMITIES:  He still has thickening and edema and scaliness of the distal leg.  ASSESSMENT: 1. Staphylococcus conjunctivitis with rhinitis and bronchitis. 2. Edema, including scrotal edema. 3. Bilateral hydroceles. 4. Peripheral vascular disease status post a left BKA. 5. Venous stasis changes of the right leg. 6. Sickle cell trait. 7. Chronic kidney disease, stage III.  PLAN:  I have talked to Case Management.  I think it would be beneficial if he continued with inpatient physical therapy in [place], [place].  He is agreeable with this.     Mila Homer. Sudie Bailey, M.D.     SDK/MEDQ  D:  06/16/2012  T:  06/16/2012  Job:  [id number]

## 2012-06-16 NOTE — Progress Notes (Signed)
Physical Therapy Treatment Patient Details Name: Steven [name] MRN: [id number] DOB: 1940/08/11 Today's Date: 06/16/2012 Time:  -     PT Assessment / Plan / Recommendation Comments on Treatment Session  Pt was not accepted into CIR, so therefore pt will need to be considered for SNF in order to develop independence with all ADLs.    Follow Up Recommendations  SNF     Does the patient have the potential to tolerate intense rehabilitation     Barriers to Discharge        Equipment Recommendations       Recommendations for Other Services    Frequency     Plan Discharge plan needs to be updated    Precautions / Restrictions     Pertinent Vitals/Pain     Mobility       Exercises     PT Diagnosis:    PT Problem List:   PT Treatment Interventions:     PT Goals    Visit Information  Last PT Received On: 06/16/12    Subjective Data      Cognition       Balance     End of Session     GP     Konrad Penta 06/16/2012, 3:16 PM

## 2012-06-16 NOTE — Evaluation (Signed)
Occupational Therapy Evaluation Patient Details Name: Steven [name] MRN: [id number] DOB: 1940/06/06 Today's Date: 06/16/2012 Time: 7829-5621 OT Time Calculation (min): 44 min  OT Assessment / Plan / Recommendation Clinical Impression  Patient is a 72 y/o male s/p failure to thrive presenting to acute OT with decreased with ADL performance, functional transfers, and Bil UE strength. Patient will benefit from CIR at discharge to increase independence with BADL and IADL as patient lives alone and performs all household tasks.     OT Assessment  Patient needs continued OT Services    Follow Up Recommendations  CIR       Equipment Recommendations  None recommended by OT       Frequency  Min 2X/week    Precautions / Restrictions Precautions Precautions: Fall Restrictions Weight Bearing Restrictions: No   Pertinent Vitals/Pain No complaints.    ADL  Grooming: Wash/dry hands;Wash/dry face;Performed;Set up Where Assessed - Grooming: Supported sitting Lower Body Bathing: Performed;+1 Total assistance Where Assessed - Lower Body Bathing: Supported standing Lower Body Dressing: Performed;Maximal assistance Where Assessed - Lower Body Dressing: Unsupported sitting Toilet Transfer: Performed;Minimal assistance Toilet Transfer Method: Stand pivot Toilet Transfer Equipment:  (to recliner) Transfers/Ambulation Related to ADLs: Patient transfers with Min Assist and rolling walker from elevated surface.    OT Diagnosis: Generalized weakness  OT Problem List: Decreased strength;Decreased activity tolerance;Impaired balance (sitting and/or standing);Decreased safety awareness;Decreased coordination;Pain OT Treatment Interventions: Self-care/ADL training;DME and/or AE instruction;Therapeutic exercise;Energy conservation;Therapeutic activities;Balance training;Patient/family education   OT Goals Acute Rehab OT Goals OT Goal Formulation: With patient Time For Goal Achievement:  06/30/12 Potential to Achieve Goals: Good ADL Goals Pt Will Perform Grooming: with supervision;Standing at sink ADL Goal: Grooming - Progress: Goal set today Pt Will Perform Upper Body Bathing: with set-up;Sitting, edge of bed ADL Goal: Upper Body Bathing - Progress: Goal set today Pt Will Perform Lower Body Bathing: Sit to stand from bed;with min assist ADL Goal: Lower Body Bathing - Progress: Goal set today Pt Will Perform Upper Body Dressing: with set-up;Sitting, bed ADL Goal: Upper Body Dressing - Progress: Goal set today Pt Will Perform Lower Body Dressing: with min assist;Sit to stand from bed ADL Goal: Lower Body Dressing - Progress: Goal set today Pt Will Transfer to Toilet: with supervision;with DME;Stand pivot transfer;3-in-1 ADL Goal: Toilet Transfer - Progress: Goal set today Pt Will Perform Toileting - Clothing Manipulation: with supervision;Sitting on 3-in-1 or toilet ADL Goal: Toileting - Clothing Manipulation - Progress: Goal set today Pt Will Perform Toileting - Hygiene: with supervision;Leaning right and/or left on 3-in-1/toilet;Sit to stand from 3-in-1/toilet ADL Goal: Toileting - Hygiene - Progress: Goal set today Arm Goals Pt Will Complete Theraband Exer: with supervision, verbal cues required/provided;to increase strength;Bilateral upper extremities;2 sets;15 reps;Level 2 Theraband Arm Goal: Theraband Exercises - Progress: Goal set today Miscellaneous OT Goals Miscellaneous OT Goal #1: patient will tolerated standing activity for 5 minutes to increase standing tolerance and independence with daily tasks. OT Goal: Miscellaneous Goal #1 - Progress: Goal set today  Visit Information  Last OT Received On: 06/16/12 PT/OT Co-Evaluation/Treatment: Yes    Subjective Data  Subjective: "I do everything for myself at home." Patient Stated Goal: To go home.   Prior Functioning     Home Living Lives With: Alone Available Help at Discharge: Friend(s) Type of Home:  House Home Access: Ramped entrance Home Layout: One level Bathroom Shower/Tub: Engineer, manufacturing systems: Standard Bathroom Accessibility: Yes How Accessible: Accessible via walker Home Adaptive Equipment: Bedside commode/3-in-1;Tub transfer bench;Walker - rolling;Wheelchair -  powered;Wheelchair - manual Prior Function Level of Independence: Independent Able to Take Stairs?: No Driving: No Vocation: Retired Musician: No difficulties Dominant Hand: Right            Cognition  Overall Cognitive Status: Appears within functional limits for tasks assessed/performed Arousal/Alertness: Awake/alert Orientation Level: Appears intact for tasks assessed Behavior During Session: [facility] for tasks performed    Extremity/Trunk Assessment Right Upper Extremity Assessment RUE ROM/Strength/Tone: Deficits RUE ROM/Strength/Tone Deficits: A/ROM WFL in all ranges. MMT: 4-/5 RUE Coordination: Deficits RUE Coordination Deficits: Decreased fine motor coordination. Decreased gross grasp. Left Upper Extremity Assessment LUE ROM/Strength/Tone: Deficits LUE ROM/Strength/Tone Deficits: A/ROM WFL in all ranges. MMT: 4-/5 LUE Coordination: Deficits LUE Coordination Deficits: Decreased fine motor coordination. Decreased gross grasp.                 End of Session OT - End of Session Equipment Utilized During Treatment: Gait belt;Other (comment) (rolling walker) Activity Tolerance: Patient tolerated treatment well Patient left: in chair;with call bell/phone within reach    [facility], OTR/L 06/16/2012, 9:31 AM

## 2012-06-16 NOTE — Progress Notes (Signed)
ANTIBIOTIC CONSULT NOTE - INITIAL  Pharmacy Consult for Vancomycin Indication: cellulitis  Allergies  Allergen Reactions  . Amlodipine Besylate-Valsartan     REACTION: renal deterioration (Patient does not recall this allergy. This medication is BRAND NAME EXFORGE which includes NORVASC (Amlodipine) which patient takes daily. Patient is not aware of the reaction, the medication listed, or the allergy itself. Advised patient to verify this medication allergy and reaction with physician.    Patient Measurements: Height: 6' (182.9 cm) Weight: 260 lb 2.3 oz (118 kg) IBW/kg (Calculated) : 77.6   Vital Signs: Temp: 98.2 F (36.8 C) (01/16 0638) Temp src: Oral (01/16 0638) BP: 179/71 mmHg (01/16 1610) Pulse Rate: 62  (01/16 0638) Intake/Output from previous day: 01/15 0701 - 01/16 0700 In: 1720 [P.O.:720; IV Piggyback:1000] Out: 3975 [Urine:3975] Intake/Output from this shift: Total I/O In: -  Out: 2125 [Urine:2125]  Labs:  [facility] 06/15/12 0511 06/14/12 0549  WBC 6.2 --  HGB 10.5* --  PLT 247 --  LABCREA -- --  CREATININE 1.74* 1.99*   Estimated Creatinine Clearance: 50.9 ml/min (by C-G formula based on Cr of 1.74). No results found for this basename: VANCOTROUGH:2,VANCOPEAK:2,VANCORANDOM:2,GENTTROUGH:2,GENTPEAK:2,GENTRANDOM:2,TOBRATROUGH:2,TOBRAPEAK:2,TOBRARND:2,AMIKACINPEAK:2,AMIKACINTROU:2,AMIKACIN:2, in the last 72 hours   Microbiology: Recent Results (from the past 720 hour(s))  EYE CULTURE     Status: Normal   Collection Time   06/12/12  5:16 PM      Component Value Range Status Comment   Specimen Description EYE LEFT   Final    Special Requests R/O PNEUMOCOCCUS   Final    Culture     Final    Value: ABUNDANT STAPHYLOCOCCUS AUREUS     Note: RIFAMPIN AND GENTAMICIN SHOULD NOT BE USED AS SINGLE DRUGS FOR TREATMENT OF STAPH INFECTIONS.     Note: NO PNEUMOCOCCUS ISOLATED   Report Status 06/15/2012 FINAL   Final    Organism ID, Bacteria STAPHYLOCOCCUS AUREUS    Final   EYE CULTURE     Status: Normal   Collection Time   06/12/12  5:16 PM      Component Value Range Status Comment   Specimen Description EYE RIGHT   Final    Special Requests R/O PNEUMOCOCCUS   Final    Culture     Final    Value: ABUNDANT STAPHYLOCOCCUS AUREUS     Note: RIFAMPIN AND GENTAMICIN SHOULD NOT BE USED AS SINGLE DRUGS FOR TREATMENT OF STAPH INFECTIONS.     Note: NO PNEUMOCOCCUS ISOLATED   Report Status 06/15/2012 FINAL   Final    Organism ID, Bacteria STAPHYLOCOCCUS AUREUS   Final     Medical History: Past Medical History  Diagnosis Date  . Amputated below knee   . Coronary artery disease   . Diabetes mellitus   . Hypertension   . Hypercholesteremia     Medications:  Scheduled:     . aspirin  81 mg Oral Daily  . atorvastatin  20 mg Oral q1800  . clopidogrel  75 mg Oral Q breakfast  . doxazosin  4 mg Oral QHS  . enoxaparin (LOVENOX) injection  40 mg Subcutaneous Q24H  . ferrous sulfate  325 mg Oral Daily  . furosemide  40 mg Oral Daily  . hydrALAZINE  100 mg Oral TID  . insulin aspart  0-20 Units Subcutaneous TID WC  . insulin aspart  0-5 Units Subcutaneous QHS  . insulin aspart  4 Units Subcutaneous TID WC  . lisinopril  10 mg Oral Daily  . metoprolol  100 mg Oral BID  .  vancomycin  1,500 mg Intravenous Q24H   Assessment: 72 yo obese M on day#5 IV Vancomycin for SA conjunctivitis. Chronic renal insufficiency noted, but has been stable this admission.   Anticipate discharge/antibiotic de-escalation soon.      Goal of Therapy:  Vancomycin trough level 10-15 mcg/ml  Plan:  1) Vancomcyin 1500mg  IV Q24h 2) Check Vancomycin trough at steady state if plan to continue 3) Monitor renal function and cx data  4) Duration of therapy per MD - recommend antibiotic de-escalation since cx pan-sensitive  Elson Clan 06/16/2012,12:51 PM

## 2012-06-16 NOTE — Clinical Social Work Psychosocial (Signed)
Clinical Social Work Department BRIEF PSYCHOSOCIAL ASSESSMENT 06/16/2012  Patient:  Steven [name], Steven [name]     Account Number:  [id number]     Admit date:  06/12/2012  Clinical Social Worker:  Nancie Neas  Date/Time:  06/16/2012 03:10 PM  Referred by:  Care Management  Date Referred:  06/16/2012 Referred for  SNF Placement   Other Referral:   Interview type:  Patient Other interview type:    PSYCHOSOCIAL DATA Living Status:  ALONE Admitted from facility:   Level of care:   Primary support name:  Samantha Primary support relationship to patient:  CHILD, ADULT Degree of support available:   supportive per pt    CURRENT CONCERNS Current Concerns  Post-Acute Placement   Other Concerns:    SOCIAL WORK ASSESSMENT / PLAN CSW met with pt at bedside. Pt alert and oriented. Pt lives alone and states that up until 3 weeks ago he was managing well at home. The past few weeks he has been falling frequently and has been more confused. Pt has a friend that checks on him every other day and he describes his daughter as involved and supportive. Daughter, Lelon Mast, lives in [place]. PT evaluated pt and recommendation was for CIR. CIR does not feel pt is appropriate. CSW discussed SNF and pt is disappointed that CIR cannot accept him but is open to SNF. Aware of copays. Pt would like to go to [place]. SNF list provided.   Assessment/plan status:  Psychosocial Support/Ongoing Assessment of Needs Other assessment/ plan:   Information/referral to community resources:   SNF list    PATIENT'S/FAMILY'S RESPONSE TO PLAN OF CARE: Pt agreeable to [facility] for rehab. CSW will fax out FL2 and follow up with bed offers.        Derenda Fennel, [place] [phone]

## 2012-06-17 ENCOUNTER — Inpatient Hospital Stay (HOSPITAL_COMMUNITY): Payer: Medicare Other

## 2012-06-17 LAB — GLUCOSE, CAPILLARY: Glucose-Capillary: 154 mg/dL — ABNORMAL HIGH (ref 70–99)

## 2012-06-17 MED ORDER — INSULIN ASPART 100 UNIT/ML ~~LOC~~ SOLN: 0.0000 [IU] | Freq: Every day | SUBCUTANEOUS | Status: DC

## 2012-06-17 MED ORDER — INSULIN ASPART 100 UNIT/ML ~~LOC~~ SOLN: 0.0000 [IU] | Freq: Three times a day (TID) | SUBCUTANEOUS | Status: DC

## 2012-06-17 NOTE — Progress Notes (Signed)
Patient has refused his insulin both sliding scale and meal coverage today.  Dr. Sudie Bailey called and notified of patient's refusal.  Informed Dr. Sudie Bailey that patient has been refusing his insulin during his stay.  No new orders given.

## 2012-06-17 NOTE — Discharge Summary (Signed)
NAME:  Steven [name], Steven [name] NO.:  [id number]  MEDICAL RECORD NO.:  [id number]  LOCATION:  A315                          FACILITY:  [facility]  PHYSICIAN:  Mila Homer. Sudie Bailey, M.D.DATE OF BIRTH:  July 12, 1940  DATE OF ADMISSION:  06/12/2012 DATE OF DISCHARGE:  LH                              DISCHARGE SUMMARY   This is a 72 year old who was admitted to the hospital with severe weakness, as well as a conjunctivitis, which turned out to be secondary to Staphylococcus aureus.  He had a benign 6 day hospitalization extending from January 12th to June 17, 2012.  Vital signs remained stable, though at times that he was hypertensive.  His admission white count was 6900, of which 62% were neutrophils.  The hemoglobin was 12.4 with a low MCV, secondary to his sickle cell trait. He had a BUN of 25, creatinine 0.171.  Several days before discharge, BUN was 20, creatinine 1.74, which was stable for him.  His white cell count was 6200, hemoglobin 10.5.  A UA showed 0-2 WBCs, 0-2 RBCs per HPF.  His eye grew out Staph aureus, which turned out to be sensitive to everything tested.  He was admitted to the hospital, put on IV fluids, put on sliding scale insulin for his diabetes, and put on vancomycin.  He was continued on metoprolol, lisinopril, hydralazine, doxazosin for hypertension, and also Plavix, atorvastatin and aspirin, meds he had been on at home.  He did well on this regimen.  His conjunctivitis and rhinitis cleared. His strength improved.  He was evaluated by Urology due to edema of the scrotum, which turned out to be secondary to bilateral hydroceles and also just generalized edema.  The edema cleared with Lasix, and adjustments were made for his blood pressure meds because he became somewhat hypotensive at that point.  Physical therapy evaluated him.  He did have a left BKA secondary to infection of his left foot some years back, but they were able to get him  ambulating somewhat with no foot pain.  X-rays of the foot were done because I had been told by a close friend of his he was having foot pain, but these x-rays were negative except for osteopenia and the patient denied pain with walking on that foot.  He was ready for discharge to a skilled nursing facility for further physical therapy on his 6th day.  FINAL DISCHARGE DIAGNOSES: 1. Staphylococcus conjunctivitis with rhinitis and bronchitis. 2. Edema involving the right leg and the scrotum. 3. Bilateral hydroceles. 4. Peripheral vascular disease status post left BKA. 5. Venous stasis changes of the right leg. 6. Sickle cell trait. 7. Chronic kidney disease stage 3. 8. Type 2 diabetes. Permanently discontinue his metformin, and he is now on sliding scale insulin as above.  Lantus may need to be added.  He will also be on: 1. Amlodipine 10 mg daily. 2. Aspirin 81 mg daily. 3. Atorvastatin 20 mg daily. 4. Plavix 75 mg daily. 5. Doxazosin 4 mg daily. 6. Ferrous sulfate 325 daily. 7. Hydralazine 100 mg t.i.d. 8. Lisinopril 10 mg daily. 9. Metoprolol 100 mg, 2 tablets b.i.d. 10.Furosemide 20 mg daily. I have stopped his vancomycin at this point.  He will be on NovoLog sliding scale insulin t.i.d. with meals and at bedtime.     Mila Homer. Sudie Bailey, M.D.     SDK/MEDQ  D:  06/17/2012  T:  06/17/2012  Job:  [id number]

## 2012-06-17 NOTE — Progress Notes (Signed)
UR Chart Review Completed  

## 2012-06-17 NOTE — Clinical Social Work Placement (Signed)
Clinical Social Work Department CLINICAL SOCIAL WORK PLACEMENT NOTE 06/17/2012  Patient:  Steven [name], Steven [name]  Account Number:  [id number] Admit date:  06/12/2012  Clinical Social Worker:  Derenda Fennel, LCSW  Date/time:  06/16/2012 03:05 PM  Clinical Social Work is seeking post-discharge placement for this patient at the following level of care:   SKILLED NURSING   (*CSW will update this form in Epic as items are completed)   06/16/2012  Patient/family provided with [facility] Department of Clinical Social Work's list of facilities offering this level of care within the geographic area requested by the patient (or if unable, by the patient's family).  06/16/2012  Patient/family informed of their freedom to choose among providers that offer the needed level of care, that participate in Medicare, Medicaid or managed care program needed by the patient, have an available bed and are willing to accept the patient.  06/16/2012  Patient/family informed of [facility]' ownership interest in [facility], as well as of the fact that they are under no obligation to receive care at this facility.  PASARR submitted to EDS on  PASARR number received from EDS on   FL2 transmitted to all facilities in geographic area requested by pt/family on  06/16/2012 FL2 transmitted to all facilities within larger geographic area on 06/16/2012  Patient informed that his/her managed care company has contracts with or will negotiate with  certain facilities, including the following:     Patient/family informed of bed offers received:  06/17/2012 Patient chooses bed at [facility] Physician recommends and patient chooses bed at  [facility]  Patient to be transferred to [facility] on  06/17/2012 Patient to be transferred to facility by family  The following physician request were entered in Epic:   Additional Comments: existing pasarr  Derenda Fennel,  LCSW [phone]

## 2012-06-17 NOTE — Progress Notes (Signed)
Report called to Annette Stable, nurse at [facility] Re[facility].

## 2012-06-17 NOTE — Progress Notes (Signed)
Pt's daughter provided with d/c packet for [facility].  Pt transported by NT via w/c to main entrance for d/c.  Pt's IV removed.  Site WNL.

## 2012-06-17 NOTE — Clinical Social Work Note (Addendum)
CSW provided bed offers to pt who chooses [facility] and [address]. Facility notified and aware of d/c today. Pt to transfer with family later this afternoon. D/C summary faxed. Pt's daughter requested note stating that she provided transport for pt in order to give to her employer. CSW provided this.   Derenda Fennel, [place] [phone]

## 2012-06-20 LAB — GLUCOSE, CAPILLARY: Glucose-Capillary: 112 mg/dL — ABNORMAL HIGH (ref 70–99)

## 2012-09-30 ENCOUNTER — Ambulatory Visit (HOSPITAL_COMMUNITY)
Admission: RE | Admit: 2012-09-30 | Discharge: 2012-09-30 | Disposition: A | Discharge status: Home / Self Care | Payer: Medicare Other | Source: Ambulatory Visit | Attending: Family Medicine | Admitting: Family Medicine

## 2012-09-30 DIAGNOSIS — S81009A Unspecified open wound, unspecified knee, initial encounter: Secondary | ICD-10-CM | POA: Insufficient documentation

## 2012-09-30 DIAGNOSIS — IMO0001 Reserved for inherently not codable concepts without codable children: Secondary | ICD-10-CM | POA: Insufficient documentation

## 2012-09-30 DIAGNOSIS — M7989 Other specified soft tissue disorders: Secondary | ICD-10-CM | POA: Insufficient documentation

## 2012-09-30 NOTE — Progress Notes (Signed)
Physical Therapy - Wound Therapy  Evaluation   Patient Details  Name: Steven [name] MRN: [id number] Date of Birth: 06/11/1940  Today's Date: 09/30/2012 Time: 1015-1105 Time Calculation (min): 50 min  Visit#: 1 of 10  Re-eval: 09/30/12  Subjective Subjective Assessment Subjective: Steven [name] states that a week ago  he took his compression garment off  to wash it and his leg swelled almost immediately.  He was then unable to get his compression wrap on and within two days he began having the wound he has now.  He went to his MD who has referred him to therapy for both wound therapy and lymphedema.  The patient has L  BKA ,  Patient and Family Stated Goals: decreased pain, wound to heal and to control the swelling of his leg. Date of Onset: 09/21/12 Prior Treatments: self dressing  Pain Assessment Pain Assessment Pain Assessment: 0-10 Pain Score:  (Pt states pain is at a 12/10-) Pain Type: Acute pain Pain Location: Leg Pain Orientation: Right Pain Onset: On-going Patients Stated Pain Goal: 4  Wound Therapy Wound 02/11/12 Contact dermatitis Leg medial aspect  4 different wounds the largest is 7cm x .5 appears backwad L with distal width being 2.5 w no depth.  All wounds covered. (Active)     Wound 06/12/12 Other (Comment)  Right water filled blistering over lower rt leg (Active)     Wound 06/16/12 Abrasion(s) Thigh Right;Lateral Scabbed area to right lateral thigh with scabbed area (Active)     Wound 09/30/12 Other (Comment) Leg Right wound covers the anterior aspect of the pt's leg and wraps laterally towards the posterior aspect. (Active)  Site / Wound Assessment Yellow 09/30/2012  5:00 PM  % Wound base Red or Granulating 0% 09/30/2012  5:00 PM  % Wound base Yellow 100% 09/30/2012  5:00 PM  % Wound base Black 0% 09/30/2012  5:00 PM  % Wound base Other (Comment) 0% 09/30/2012  5:00 PM  Peri-wound Assessment Intact 09/30/2012  5:00 PM  Wound Length (cm) 18 cm 09/30/2012  5:00 PM  Wound  Width (cm) 22 cm 09/30/2012  5:00 PM  Margins Attached edges (approximated) 09/30/2012  5:00 PM  Drainage Amount Copious 09/30/2012  5:00 PM  Drainage Description Serous 09/30/2012  5:00 PM  Non-staged Wound Description Partial thickness 09/30/2012  5:00 PM  Treatment Cleansed 09/30/2012  5:00 PM  Dressing Type Alginate;Compression wrap 09/30/2012  5:00 PM  Dressing Changed New 09/30/2012  5:00 PM  Dressing Status Clean 09/30/2012  5:00 PM     volume:  R LE 20422.71 cc   Physical Therapy Assessment and Plan Wound Therapy - Assess/Plan/Recommendations Wound Therapy - Clinical Statement: Pt with lymphedema of R LE with 20422.71 cc of fluid causing a non-healing wound.  Pt will benefit from skilled PT for decongestive manual techniques and wrapping as well as woulnd care. Wound Therapy - Functional Problem List: very painful causing decreased mobiliyt and ambulation. Factors Delaying/Impairing Wound Healing: Immobility;Multiple medical problems;Vascular compromise;Diabetes Mellitus (kidney disease, DM, Periperal vascular dz, CVA, ...) Hydrotherapy Plan: Debridement;Dressing change;Patient/family education (decongestive techniques) Wound Therapy - Frequency: 3X / week Wound Therapy - Current Recommendations: PT Wound Plan: Pt to recieve decongestive manual techniques as well as compression wrapping and debridement to wounds.      Goals Wound Therapy Goals - Improve the function of patient's integumentary system by progressing the wound(s) through the phases of wound healing by: Decrease Necrotic Tissue to: 0 Decrease Necrotic Tissue - Progress: Goal set today Increase Granulation Tissue  to: 100 Increase Granulation Tissue - Progress: Goal set today Decrease Length/Width/Depth by (cm): 5xm 5 xm Decrease Length/Width/Depth - Progress: Goal set today Improve Drainage Characteristics:  (scant) Improve Drainage Characteristics - Progress: Goal set today Patient/Family will be able to : verbalize the importance  of wearing compression hose Patient/Family Instruction Goal - Progress: Goal set today Additional Wound Therapy Goal: instructed in self lymph techniques. Lymph fluid to be decreased by 50% Additional Wound Therapy Goal - Progress: Goal set today Goals/treatment plan/discharge plan were made with and agreed upon by patient/family: Yes Time For Goal Achievement:  (3 weeks) Wound Therapy - Potential for Goals: Good  Problem List Patient Active Problem List   Diagnosis Date Noted  . WEAKNESS 06/05/2008  . DYSPNEA ON EXERTION 11/15/2007  . CATARACT NOS 02/15/2007  . DIABETES MELLITUS, TYPE II, CONTROLLED, WITH COMPLICATIONS 08/24/2006  . RENAL FAILURE, ACUTE 07/30/2006  . HYPERLIPIDEMIA 04/26/2006  . ANEMIA-NOS 04/26/2006  . HYPERTENSION 04/26/2006  . KIDNEY DISEASE, CHRONIC, STAGE III 04/26/2006  . CEREBROVASCULAR ACCIDENT, HX OF 04/26/2006    GP Functional Assessment Tool Used: clinical judgement Functional Limitation: Other PT primary Other PT Primary Current Status (Y8657): At least 80 percent but less than 100 percent impaired, limited or restricted Other PT Primary Goal Status (Q4696): At least 40 percent but less than 60 percent impaired, limited or restricted  [name],CINDY 09/30/2012, 5:12 PM

## 2012-10-03 ENCOUNTER — Ambulatory Visit (HOSPITAL_COMMUNITY)
Admission: RE | Admit: 2012-10-03 | Discharge: 2012-10-03 | Disposition: A | Discharge status: Home / Self Care | Payer: Medicare Other | Source: Ambulatory Visit | Attending: Family Medicine | Admitting: Family Medicine

## 2012-10-03 NOTE — Progress Notes (Signed)
Physical Therapy - Wound Therapy  Treatment   Patient Details  Name: Steven [name] MRN: [id number] Date of Birth: [birth year] Charge:  Debridement manual technique x 20 Today's Date: 10/03/2012 Time: 4696-2952 Time Calculation (min): 55 min  Visit#: 2 of 10  Re-eval: 09/30/12  Subjective Subjective Assessment Subjective: Pt states his leg bothered him over the weekend and is draining quite a bit. Pt states pain is nowa 6/10.   Pain Assessment Pain Assessment Pain Score:   6 Pain Type: Acute pain Pain Location: Leg Pain Orientation: Right Pain Onset: On-going Patients Stated Pain Goal: 4  Wound Therapy Wound 02/11/12 Contact dermatitis Leg medial aspect  4 different wounds the largest is 7cm x .5 appears backwad L with distal width being 2.5 w no depth.  All wounds covered. (Active)     Wound 06/12/12 Other (Comment)  Right water filled blistering over lower rt leg (Active)     Wound 06/16/12 Abrasion(s) Thigh Right;Lateral Scabbed area to right lateral thigh with scabbed area (Active)     Wound 09/30/12 Other (Comment) Leg Right wound covers the anterior aspect of the pt's leg and wraps laterally towards the posterior aspect. (Active)  Site / Wound Assessment Pink;Yellow 10/03/2012 10:49 AM  % Wound base Red or Granulating 50% 10/03/2012 10:49 AM  % Wound base Yellow 50% 10/03/2012 10:49 AM  % Wound base Black 0% 10/03/2012 10:49 AM  % Wound base Other (Comment) 0% 10/03/2012 10:49 AM  Peri-wound Assessment Intact 10/03/2012 10:49 AM  Wound Length (cm) 18 cm 09/30/2012  5:00 PM  Wound Width (cm) 22 cm 09/30/2012  5:00 PM  Margins Attached edges (approximated) 09/30/2012  5:00 PM  Drainage Amount Copious 10/03/2012 10:49 AM  Drainage Description Purulent;Odor 10/03/2012 10:49 AM  Non-staged Wound Description Partial thickness 10/03/2012 10:49 AM  Treatment Cleansed;Debridement (Selective);Other (Comment) 10/03/2012 10:49 AM  Dressing Type Alginate;Compression wrap 10/03/2012 10:49 AM  Dressing  Changed Changed 10/03/2012 10:49 AM  Dressing Status Clean 10/03/2012 10:49 AM  Manual techniques for decongestive of lymph fluid  Selective Debridement Selective Debridement - Tools Used: Forceps Selective Debridement - Tissue Removed: slough   Physical Therapy Assessment and Plan Wound Therapy - Assess/Plan/Recommendations Wound Therapy - Clinical Statement: Pt LE with decreased swelling; Used 3 sheets of alginate last treatment which was not enough.  Increased alginate sheets to 5. Pt recieved decongestive manual techniques prior to wound cleansing and dressing.  Decreased sensitivity allowed for increased manual techniques. Wound Therapy - Functional Problem List: very painful causing decreased mobiliyt and ambulation. Factors Delaying/Impairing Wound Healing: Immobility;Multiple medical problems;Vascular compromise;Diabetes Mellitus Hydrotherapy Plan: Debridement;Dressing change;Patient/family education (decongestive manual techniques.) Wound Therapy - Frequency: 3X / week Wound Therapy - Current Recommendations: PT Wound Plan: Pt to recieve decongestive manual techniques as well as compression wrapping and debridement to wounds.      Goals  progressing  Problem List Patient Active Problem List   Diagnosis Date Noted  . WEAKNESS 06/05/2008  . DYSPNEA ON EXERTION 11/15/2007  . CATARACT NOS 02/15/2007  . DIABETES MELLITUS, TYPE II, CONTROLLED, WITH COMPLICATIONS 08/24/2006  . RENAL FAILURE, ACUTE 07/30/2006  . HYPERLIPIDEMIA 04/26/2006  . ANEMIA-NOS 04/26/2006  . HYPERTENSION 04/26/2006  . KIDNEY DISEASE, CHRONIC, STAGE III 04/26/2006  . CEREBROVASCULAR ACCIDENT, HX OF 04/26/2006    GP    Emilly Lavey,CINDY 10/03/2012, 10:58 AM

## 2012-10-05 ENCOUNTER — Ambulatory Visit (HOSPITAL_COMMUNITY)
Admission: RE | Admit: 2012-10-05 | Discharge: 2012-10-05 | Disposition: A | Discharge status: Home / Self Care | Payer: Medicare Other | Source: Ambulatory Visit | Attending: Family Medicine | Admitting: Family Medicine

## 2012-10-05 NOTE — Progress Notes (Signed)
Physical Therapy - Wound Therapy  Treatment   Patient Details  Name: Alanmichael Barmore MRN: [id number] Date of Birth: 1940-11-25  Today's Date: 10/05/2012 Time: 4098-1191 Time Calculation (min): 49 min Visit#: 3 of 10  Re-eval: 09/30/12 Charges:  Manual 24', deb <20cm  Subjective Subjective Assessment Subjective: Pt. states his wound has drained through his bandages.  Reports sensitivity, especially at lateral LE.  Heel very sensitive also, with noted wound.  Pain Assessment Pain Assessment Pain Assessment: Faces Faces Pain Scale: Hurts little more  Wound Therapy Wound 02/11/12 Contact dermatitis Leg medial aspect  4 different wounds the largest is 7cm x .5 appears backwad L with distal width being 2.5 w no depth.  All wounds covered. (Active)     Wound 06/12/12 Other (Comment)  Right water filled blistering over lower rt leg (Active)     Wound 06/16/12 Abrasion(s) Thigh Right;Lateral Scabbed area to right lateral thigh with scabbed area (Active)     Wound 09/30/12 Other (Comment) Leg Right wound covers the anterior aspect of the pt's leg and wraps laterally towards the posterior aspect. (Active)  Site / Wound Assessment Pink;Yellow 10/05/2012 10:29 AM  % Wound base Red or Granulating 50% 10/05/2012 10:29 AM  % Wound base Yellow 50% 10/05/2012 10:29 AM  % Wound base Black 0% 10/05/2012 10:29 AM  % Wound base Other (Comment) 0% 10/05/2012 10:29 AM  Peri-wound Assessment Edema 10/05/2012 10:29 AM  Wound Length (cm) 18 cm 09/30/2012  5:00 PM  Wound Width (cm) 22 cm 09/30/2012  5:00 PM  Margins Attached edges (approximated) 09/30/2012  5:00 PM  Drainage Amount Copious 10/05/2012 10:29 AM  Drainage Description Serous 10/05/2012 10:29 AM  Non-staged Wound Description Not applicable 10/05/2012 10:29 AM  Treatment Cleansed;Debridement (Selective) 10/05/2012 10:29 AM  Dressing Type Silver dressings;Alginate 10/05/2012 10:29 AM  Dressing Changed Changed 10/05/2012 10:29 AM  Dressing Status Clean;Dry;Intact  10/05/2012 10:29 AM     Wound 10/05/12 Other (Comment) Heel Right stage 2 pressure ulcer (Active)  Site / Wound Assessment Black;Yellow 10/05/2012 10:29 AM  % Wound base Red or Granulating 0% 10/05/2012 10:29 AM  % Wound base Yellow 75% 10/05/2012 10:29 AM  % Wound base Black 25% 10/05/2012 10:29 AM  % Wound base Other (Comment) 0% 10/05/2012 10:29 AM  Peri-wound Assessment Edema 10/05/2012 10:29 AM  Wound Length (cm) 2 cm 10/05/2012 10:29 AM  Wound Width (cm) 3 cm 10/05/2012 10:29 AM  Wound Depth (cm) 2 cm (may be deeper) 10/05/2012 10:29 AM  Drainage Amount Moderate 10/05/2012 10:29 AM  Drainage Description Serous 10/05/2012 10:29 AM  Treatment Cleansed;Debridement (Selective) 10/05/2012 10:29 AM  Dressing Type Alginate, medihoney 10/05/2012 10:29 AM  Dressing Changed New 10/05/2012 10:29 AM  Dressing Status Clean;Dry;Intact 10/05/2012 10:29 AM   Selective Debridement Selective Debridement - Location: anterior and lateral R LE, heel wound Selective Debridement - Tools Used: Forceps Selective Debridement - Tissue Removed: slough   Physical Therapy Assessment and Plan Wound Therapy - Assess/Plan/Recommendations Wound Therapy - Clinical Statement: Rt heel wound added to doc flowsheet, appears to have had for some time.  Center of heel wound is necrotic (25%) with remaining slough at 75%.  Depth of at least 2cm but possibly more once necrotic tissue is removed.  Pt will need to be transferred to mat for treatment to properly massage for MLD and to be able to debride LE and heel thoroughly.  Educated pt. on elevating LE's and keeping heel floating with demonstration, as pt. admits to mostly sitting in wheelchair with feet  down and heel propped on floor (unable to bend knee to place foot flat comfortably).  Discussed sending order to MD for another juxtafit circaid garment so compression in RLE will not be interrupted.  Pt agreeable to this.  Continued dressings on RLE with silver alginate (aquacel) and Rt heel with medihoney  alginate dressing.  Insured pt. was scheduled with lymph trained therapist.  Toes will need to be wrapped as well as RLE to establish a pressure gradient. Wound Plan: Continue with MLD, woundcare and compression bandaging.  Will continue with coban until drainage decreases then switch to juxtafit circaid garment over dressings.  Order faxed to MD for another garment.        [name], PTA/CLT 10/05/2012, 10:53 AM

## 2012-10-07 ENCOUNTER — Ambulatory Visit (HOSPITAL_COMMUNITY)
Admission: RE | Admit: 2012-10-07 | Discharge: 2012-10-07 | Disposition: A | Discharge status: Home / Self Care | Payer: Medicare Other | Source: Ambulatory Visit | Attending: Family Medicine | Admitting: Family Medicine

## 2012-10-07 NOTE — Progress Notes (Addendum)
Physical Therapy - Wound Therapy  Treatment   Patient Details  Name: Steven [name] MRN: [id number] Date of Birth: [birth year]  Today's Date: 10/07/2012 Time: 1200-1230 Time Calculation (min): 30 min CHARGES: dEb < 20 cm Visit#: 4 of 10  Re-eval: 10/31/12  Subjective Subjective Assessment Subjective: reports that his legs are draining a lot and are still very sore. reports sensitivie to lateral wound and heel  Pain Assessment  FACES: 8/10 with gentle debridement  Wound Therapy Wound 02/11/12 Contact dermatitis Leg medial aspect  4 different wounds the largest is 7cm x .5 appears backwad L with distal width being 2.5 w no depth.  All wounds covered. (Active)     Wound 06/12/12 Other (Comment)  Right water filled blistering over lower rt leg (Active)     Wound 06/16/12 Abrasion(s) Thigh Right;Lateral Scabbed area to right lateral thigh with scabbed area (Active)     Wound 09/30/12 Other (Comment) Leg Right wound covers the anterior aspect of the pt's leg and wraps laterally towards the posterior aspect. (Active)  Site / Wound Assessment Pink;Yellow 10/07/2012  2:05 PM  % Wound base Red or Granulating 50% 10/07/2012  2:05 PM  % Wound base Yellow 50% 10/07/2012  2:05 PM  % Wound base Black 0% 10/07/2012  2:05 PM  % Wound base Other (Comment) 0% 10/07/2012  2:05 PM  Peri-wound Assessment Edema 10/07/2012  2:05 PM  Wound Length (cm) 18 cm 09/30/2012  5:00 PM  Wound Width (cm) 22 cm 09/30/2012  5:00 PM  Margins Attached edges (approximated) 09/30/2012  5:00 PM  Drainage Amount Copious 10/07/2012  2:05 PM  Drainage Description Serous 10/07/2012  2:05 PM  Non-staged Wound Description Not applicable 10/07/2012  2:05 PM  Treatment Cleansed;Debridement (Selective) 10/05/2012 10:29 AM  Dressing Type Silver dressings;Alginate;Compression wrap 10/07/2012  2:05 PM  Dressing Changed New 10/07/2012  2:05 PM  Dressing Status Clean;Dry;Intact 10/07/2012  2:05 PM     Wound 10/05/12 Other (Comment) Heel Right stage 2 pressure  ulcer (Active)  Site / Wound Assessment Black;Yellow 10/07/2012  2:05 PM  % Wound base Red or Granulating 0% 10/07/2012  2:05 PM  % Wound base Yellow 75% 10/07/2012  2:05 PM  % Wound base Black 25% 10/07/2012  2:05 PM  % Wound base Other (Comment) 0% 10/07/2012  2:05 PM  Peri-wound Assessment Edema 10/07/2012  2:05 PM  Wound Length (cm) 2 cm 10/05/2012 10:29 AM  Wound Width (cm) 3 cm 10/05/2012 10:29 AM  Wound Depth (cm) 2 cm 10/05/2012 10:29 AM  Drainage Amount Moderate 10/07/2012  2:05 PM  Drainage Description Serous 10/07/2012  2:05 PM  Treatment Cleansed;Debridement (Selective) 10/05/2012 10:29 AM  Dressing Type Silver hydrofiber 10/07/2012  2:05 PM  Dressing Changed New 10/05/2012 10:29 AM  Dressing Status Clean;Dry;Intact 10/07/2012  2:05 PM   Selective Debridement Selective Debridement - Location: anterior and lateral R LE, heel wound Selective Debridement - Tools Used: Other (comment) (gauze) Selective Debridement - Tissue Removed: slough   Physical Therapy Assessment and Plan Wound Therapy - Assess/Plan/Recommendations Wound Therapy - Clinical Statement: Pt 15 minuttes late. Did not complete MLD today.  Continues to have significant drainage to wound bed.  Dressed with alginate and silver hydrofiber.  Has great success with debrdiment using only gauze and salient to lateral wound, unable to debrid heel due to time constraints, packed with silver hydrofiber to decrease slough and improve absorption to the area.  continued to educate patient on importance of mobility to improve overall function and pain in legs.  Wound Plan: Continue with MLD, woundcare and compression bandaging.  Will continue with coban until drainage decreases then switch to juxtafit circaid garment over dressings.  Order faxed to MD for another garment.      Goals Wound Therapy Goals - Improve the function of patient's integumentary system by progressing the wound(s) through the phases of wound healing by: Decrease Necrotic Tissue to:  0 Decrease Necrotic Tissue - Progress: Progressing toward goal Increase Granulation Tissue to: 100 Increase Granulation Tissue - Progress: Progressing toward goal Decrease Length/Width/Depth by (cm): 5xm 5 xm Decrease Length/Width/Depth - Progress: Progressing toward goal Patient/Family will be able to : verbalize the importance of wearing compression hose Patient/Family Instruction Goal - Progress: Progressing toward goal  Problem List Patient Active Problem List   Diagnosis Date Noted  . WEAKNESS 06/05/2008  . DYSPNEA ON EXERTION 11/15/2007  . CATARACT NOS 02/15/2007  . DIABETES MELLITUS, TYPE II, CONTROLLED, WITH COMPLICATIONS 08/24/2006  . RENAL FAILURE, ACUTE 07/30/2006  . HYPERLIPIDEMIA 04/26/2006  . ANEMIA-NOS 04/26/2006  . HYPERTENSION 04/26/2006  . KIDNEY DISEASE, CHRONIC, STAGE III 04/26/2006  . CEREBROVASCULAR ACCIDENT, HX OF 04/26/2006    GP    Lester Platas, MPT, ATC 10/07/2012, 2:14 PM

## 2012-10-10 ENCOUNTER — Ambulatory Visit (HOSPITAL_COMMUNITY)
Admission: RE | Admit: 2012-10-10 | Discharge: 2012-10-10 | Disposition: A | Discharge status: Home / Self Care | Payer: Medicare Other | Source: Ambulatory Visit | Attending: Family Medicine | Admitting: Family Medicine

## 2012-10-10 NOTE — Progress Notes (Signed)
Physical Therapy - Wound Therapy  Treatment   Patient Details  Name: Barak Bialecki MRN: [id number] Date of Birth: 05/30/1941  Today's Date: 10/10/2012 Time: 4098-1191 Time Calculation (min): 55 min Charges:  Deb <20cm, Deb >20cm Visit#: 5 of 10  Re-eval: 10/31/12  Subjective Subjective Assessment Subjective: Pt. was 38' late for appt.  Unable to complete MLD today; requested secretary schedule 2 time slots.  Pt. reports less sensitivity/ pain, however heel continues to hurt.  States it is itching some too.  Pain Assessment Pain Assessment Pain Assessment: Faces Pain Score:   4  Wound Therapy Wound 02/11/12 Contact dermatitis Leg medial aspect  4 different wounds the largest is 7cm x .5 appears backwad L with distal width being 2.5 w no depth.  All wounds covered. (Active)     Wound 06/12/12 Other (Comment)  Right water filled blistering over lower rt leg (Active)     Wound 06/16/12 Abrasion(s) Thigh Right;Lateral Scabbed area to right lateral thigh with scabbed area (Active)     Wound 09/30/12 Other (Comment) Leg Right wound covers the anterior aspect of the pt's leg and wraps laterally towards the posterior aspect. (Active)  Site / Wound Assessment Pink;Yellow 10/10/2012  1:12 PM  % Wound base Red or Granulating 65% 10/10/2012  1:12 PM  % Wound base Yellow 35% 10/10/2012  1:12 PM  % Wound base Black 0% 10/10/2012  1:12 PM  % Wound base Other (Comment) 0% 10/10/2012  1:12 PM  Peri-wound Assessment Edema 10/10/2012  1:12 PM  Wound Length (cm) 18 cm 09/30/2012  5:00 PM  Wound Width (cm) 22 cm 09/30/2012  5:00 PM  Margins Attached edges (approximated) 09/30/2012  5:00 PM  Drainage Amount Moderate 10/10/2012  1:12 PM  Drainage Description Serous 10/10/2012  1:12 PM  Non-staged Wound Description Not applicable 10/10/2012  1:12 PM  Treatment Cleansed;Debridement (Selective) 10/10/2012  1:12 PM  Dressing Type Silver dressings;Alginate;Compression wrap 10/10/2012  1:12 PM  Dressing Changed New  10/10/2012  1:12 PM  Dressing Status Clean;Dry;Intact 10/10/2012  1:12 PM     Wound 10/05/12 Other (Comment) Heel Right stage 2 pressure ulcer (Active)  Site / Wound Assessment Black;Yellow 10/10/2012  1:12 PM  % Wound base Red or Granulating 5% 10/10/2012  1:12 PM  % Wound base Yellow 70% 10/10/2012  1:12 PM  % Wound base Black 25% 10/10/2012  1:12 PM  % Wound base Other (Comment) 0% 10/10/2012  1:12 PM  Peri-wound Assessment Edema 10/10/2012  1:12 PM  Wound Length (cm) 2 cm 10/05/2012 10:29 AM  Wound Width (cm) 3 cm 10/05/2012 10:29 AM  Wound Depth (cm) 2 cm 10/05/2012 10:29 AM  Drainage Amount Moderate 10/10/2012  1:12 PM  Drainage Description Serous 10/10/2012  1:12 PM  Treatment Cleansed;Debridement (Selective) 10/10/2012  1:12 PM  Dressing Type Alginate 10/10/2012  1:12 PM  Dressing Changed New 10/10/2012  1:12 PM  Dressing Status Clean;Intact;Dry 10/10/2012  1:12 PM   Selective Debridement Selective Debridement - Location: anterior and lateral R LE, heel wound Selective Debridement - Tools Used: Other (comment) Selective Debridement - Tissue Removed: slough   Physical Therapy Assessment and Plan Wound Therapy - Assess/Plan/Recommendations Wound Therapy - Clinical Statement: Pt 15 minuttes late, did not complete MLD today.  Discussed importance of coming on time and asked secretary to scedule more time.  Overall improvement with less drainage anterior LE, still copious lateral LE.  Increased granulation today.  Heel wound remains mostly necrotic with some odor present.  Changed dressing to medihoney alginate  at heel to promote granulation.  Anterior LE alginate too adherent to wound as not draining as bad.  Changed this dressing to xeroform.  Wound Plan: Continue with MLD, woundcare and compression bandaging.  Will continue with coban until drainage decreases then switch to juxtafit circaid garment over dressings.  Order faxed to MD for another garment.       Problem List Patient Active Problem  List   Diagnosis Date Noted  . WEAKNESS 06/05/2008  . DYSPNEA ON EXERTION 11/15/2007  . CATARACT NOS 02/15/2007  . DIABETES MELLITUS, TYPE II, CONTROLLED, WITH COMPLICATIONS 08/24/2006  . RENAL FAILURE, ACUTE 07/30/2006  . HYPERLIPIDEMIA 04/26/2006  . ANEMIA-NOS 04/26/2006  . HYPERTENSION 04/26/2006  . KIDNEY DISEASE, CHRONIC, STAGE III 04/26/2006  . CEREBROVASCULAR ACCIDENT, HX OF 04/26/2006     [name], PTA/CLT 10/10/2012, 1:20 PM

## 2012-10-12 ENCOUNTER — Ambulatory Visit (HOSPITAL_COMMUNITY)
Admission: RE | Admit: 2012-10-12 | Discharge: 2012-10-12 | Disposition: A | Discharge status: Home / Self Care | Payer: Medicare Other | Source: Ambulatory Visit | Attending: Family Medicine | Admitting: Family Medicine

## 2012-10-12 NOTE — Progress Notes (Signed)
Physical Therapy - Wound Therapy  Treatment   Patient Details  Name: Steven [name] MRN: [id number] Date of Birth: [birth year]  Today's Date: 10/12/2012 Time: 4098-1191 Time Calculation (min): 70 min Visit#: 6 of 10  Re-eval: 10/31/12 Charges:  Manual 40', deb<20cm, deb>20cm  Subjective Subjective Assessment Subjective: Pt. states he doesn't think it's draining as badly.  No pain with less tenderness.  States it it itching a little.  Pain Assessment Pain Assessment Pain Assessment: No/denies pain  Wound Therapy Wound 02/11/12 Contact dermatitis Leg medial aspect  4 different wounds the largest is 7cm x .5 appears backwad L with distal width being 2.5 w no depth.  All wounds covered. (Active)     Wound 06/12/12 Other (Comment)  Right water filled blistering over lower rt leg (Active)     Wound 06/16/12 Abrasion(s) Thigh Right;Lateral Scabbed area to right lateral thigh with scabbed area (Active)     Wound 09/30/12 Other (Comment) Leg Right wound covers the anterior aspect of the pt's leg and wraps laterally towards the posterior aspect. (Active)  Site / Wound Assessment Pink;Yellow 10/12/2012  2:14 PM  % Wound base Red or Granulating 65% 10/12/2012  2:14 PM  % Wound base Yellow 35% 10/12/2012  2:14 PM  % Wound base Black 0% 10/12/2012  2:14 PM  % Wound base Other (Comment) 0% 10/12/2012  2:14 PM  Peri-wound Assessment Edema 10/12/2012  2:14 PM  Wound Length (cm) 18 cm 09/30/2012  5:00 PM  Wound Width (cm) 22 cm 09/30/2012  5:00 PM  Margins Attached edges (approximated) 09/30/2012  5:00 PM  Drainage Amount Moderate 10/12/2012  2:14 PM  Drainage Description Serous 10/12/2012  2:14 PM  Non-staged Wound Description Not applicable 10/12/2012  2:14 PM  Treatment Cleansed;Debridement (Selective) 10/12/2012  2:14 PM  Dressing Type Silver dressings;Alginate;Compression wrap 10/12/2012  2:14 PM  Dressing Changed New 10/12/2012  2:14 PM  Dressing Status Clean;Dry;Intact 10/12/2012  2:14 PM     Wound  10/05/12 Other (Comment) Heel Right stage 2 pressure ulcer (Active)  Site / Wound Assessment Yellow;Pink 10/12/2012  2:14 PM  % Wound base Red or Granulating 5% 10/12/2012  2:14 PM  % Wound base Yellow 95% 10/12/2012  2:14 PM  % Wound base Black 0% 10/12/2012  2:14 PM  % Wound base Other (Comment) 0% 10/12/2012  2:14 PM  Peri-wound Assessment Edema 10/12/2012  2:14 PM  Wound Length (cm) 2 cm 10/05/2012 10:29 AM  Wound Width (cm) 3 cm 10/05/2012 10:29 AM  Wound Depth (cm) 2 cm 10/05/2012 10:29 AM  Drainage Amount Moderate 10/12/2012  2:14 PM  Drainage Description Serous 10/12/2012  2:14 PM  Treatment Hydrotherapy (Pulse lavage);Debridement (Selective) 10/12/2012  2:14 PM  Dressing Type Alginate 10/12/2012  2:14 PM  Dressing Changed Changed 10/12/2012  2:14 PM  Dressing Status Clean;Dry;Intact 10/12/2012  2:14 PM    MLD performed for RLE routing lymph fluid to Lt inguinal nodes and Rt axillary nodes  Selective Debridement Selective Debridement - Location: anterior and lateral R LE, heel wound Selective Debridement - Tools Used: Other (comment) Selective Debridement - Tissue Removed: slough   Physical Therapy Assessment and Plan Wound Therapy - Assess/Plan/Recommendations Wound Therapy - Clinical Statement:  Began MLD for RLE in supine today.   Heel wound with increase in odor and drainage today.  Added pulsed lavage to irrigate/cleanse thoroughly.  Slough debrided from woundbed with unknown depth, however fear of bone involvement.  Contacted Dr. Sudie Bailey who is going to contact pt. for further evaluation and rule  out osteomyelitis.  Other wounds are healing niceley with slowly increasing granulation and decreasing edema. Wound Plan: Continue with MLD, woundcare and compression bandaging.  Will continue with coban until drainage decreases then switch to juxtafit circaid garment over dressings.  Order faxed to MD for another garment.  Await further MD orders.       Problem List Patient Active Problem List    Diagnosis Date Noted  . WEAKNESS 06/05/2008  . DYSPNEA ON EXERTION 11/15/2007  . CATARACT NOS 02/15/2007  . DIABETES MELLITUS, TYPE II, CONTROLLED, WITH COMPLICATIONS 08/24/2006  . RENAL FAILURE, ACUTE 07/30/2006  . HYPERLIPIDEMIA 04/26/2006  . ANEMIA-NOS 04/26/2006  . HYPERTENSION 04/26/2006  . KIDNEY DISEASE, CHRONIC, STAGE III 04/26/2006  . CEREBROVASCULAR ACCIDENT, HX OF 04/26/2006     [name], PTA/CLT 10/12/2012, 2:41 PM

## 2012-10-14 ENCOUNTER — Ambulatory Visit (HOSPITAL_COMMUNITY)
Admission: RE | Admit: 2012-10-14 | Discharge: 2012-10-14 | Disposition: A | Discharge status: Home / Self Care | Payer: Medicare Other | Source: Ambulatory Visit | Attending: Physical Therapy | Admitting: Physical Therapy

## 2012-10-14 ENCOUNTER — Ambulatory Visit (HOSPITAL_COMMUNITY)
Admission: RE | Admit: 2012-10-14 | Discharge: 2012-10-14 | Disposition: A | Discharge status: Home / Self Care | Payer: Medicare Other | Source: Ambulatory Visit | Attending: Family Medicine | Admitting: Family Medicine

## 2012-10-14 NOTE — Progress Notes (Signed)
Physical Therapy - Wound Therapy  Treatment   Patient Details  Name: Steven [name] MRN: [id number] Date of Birth: 11-18-1940 Charge:  Debridement x 1; manual x 30 Today's Date: 10/14/2012 Time: 4098-1191 Time Calculation (min): 87 min  Visit#: 7 of 10  Re-eval: 10/31/12  Subjective Subjective Assessment Subjective: Pt states that his leg is not near as tender as it was draining continues to decrease. Date of Onset: 09/21/12 Prior Treatments: self dressing  Pain Assessment Pain Assessment Pain Score:   3 Pain Type: Acute pain Pain Location: Leg Pain Orientation: Right Pain Onset: On-going  Wound Therapy Wound 02/11/12 Contact dermatitis Leg medial aspect  4 different wounds the largest is 7cm x .5 appears backwad L with distal width being 2.5 w no depth.  All wounds covered. (Active)     Wound 06/12/12 Other (Comment)  Right water filled blistering over lower rt leg (Active)     Wound 06/16/12 Abrasion(s) Thigh Right;Lateral Scabbed area to right lateral thigh with scabbed area (Active)     Wound 09/30/12 Other (Comment) Leg Right wound covers the anterior aspect of the pt's leg and wraps laterally towards the posterior aspect. (Active)  Site / Wound Assessment Pink;Yellow 10/14/2012 11:26 AM  % Wound base Red or Granulating 70% 10/14/2012 11:26 AM  % Wound base Yellow 30% 10/14/2012 11:26 AM  % Wound base Black 0% 10/14/2012 11:26 AM  % Wound base Other (Comment) 0% 10/14/2012 11:26 AM  Peri-wound Assessment Edema 10/14/2012 11:26 AM  Wound Length (cm) 18 cm 09/30/2012  5:00 PM  Wound Width (cm) 22 cm 09/30/2012  5:00 PM  Margins Attached edges (approximated) 10/14/2012 11:26 AM  Closure None 10/14/2012 11:26 AM  Drainage Amount Moderate 10/12/2012  2:14 PM  Drainage Description Serous;No odor 10/14/2012 11:26 AM  Non-staged Wound Description Full thickness 10/14/2012 11:26 AM  Treatment Cleansed;Debridement (Selective) 10/14/2012 11:26 AM  Dressing Type Steven  hydrofiber;Compression wrap;Impregnated gauze (bismuth) 10/14/2012 11:26 AM  Dressing Changed Changed 10/14/2012 11:26 AM  Dressing Status Clean;Dry 10/14/2012 11:26 AM     Wound 10/05/12 Other (Comment) Heel Right stage 2 pressure ulcer (Active)  Site / Wound Assessment Yellow;Pink 10/14/2012 11:26 AM  % Wound base Red or Granulating 5% 10/14/2012 11:26 AM  % Wound base Yellow 95% 10/14/2012 11:26 AM  % Wound base Black 0% 10/14/2012 11:26 AM  % Wound base Other (Comment) 0% 10/14/2012 11:26 AM  Peri-wound Assessment Edema 10/14/2012 11:26 AM  Wound Length (cm) 3 cm 10/14/2012 11:26 AM  Wound Width (cm) 3 cm 10/14/2012 11:26 AM  Wound Depth (cm) 3 cm 10/14/2012 11:26 AM  Closure None 10/14/2012 11:26 AM  Drainage Amount Moderate 10/14/2012 11:26 AM  Drainage Description Serous;Odor 10/14/2012 11:26 AM  Non-staged Wound Description Full thickness 10/14/2012 11:26 AM  Treatment Debridement (Selective);Hydrotherapy (Pulse lavage) 10/14/2012 11:26 AM  Dressing Type Steven hydrofiber 10/14/2012 11:26 AM  Dressing Changed Changed 10/14/2012 11:26 AM  Dressing Status Clean;Dry 10/14/2012 11:26 AM   Hydrotherapy Pulsed lavage therapy - wound location: heel Pulsed Lavage with Suction (psi): 4 psi Pulsed Lavage with Suction - Normal Saline Used: 1000 mL Pulsed Lavage Tip: Tip with splash shield Selective Debridement Selective Debridement - Location:  LE and heel wound Selective Debridement - Tools Used: Forceps;Scalpel;Scissors Selective Debridement - Tissue Removed: slough  Pt received manual lymph decongestive techniques to decrease lymph volume in LE while supine.   Physical Therapy Assessment and Plan Wound Therapy - Assess/Plan/Recommendations Wound Therapy - Clinical Statement: Pt LE wounds continue to improve while heel  wound has minimal change.  Heel wound continues to increase in depth as therapist debirdes.  Pt had sifver hydrofiber packed in heel wound at end of debridement.  Pt swelling continues  to decrease pt respionding well to manual lymph massage. Wound Therapy - Functional Problem List: improving pain but pt is still having significant decline in mobility may benefit from mobility and strengthening once wound care is done. Factors Delaying/Impairing Wound Healing: Immobility;Multiple medical problems;Vascular compromise;Diabetes Mellitus Hydrotherapy Plan: Debridement;Dressing change;Patient/family education Wound Therapy - Frequency: 3X / week Wound Plan: Continue with MLD, woundcare and compression bandaging.  Will continue with coban until drainage decreases then switch to juxtafit circaid garment over dressings.  Order faxed to MD for another garment.  Await further MD orders.      Goals Wound Therapy Goals - Improve the function of patient's integumentary system by progressing the wound(s) through the phases of wound healing by: Decrease Necrotic Tissue to: 0 Decrease Necrotic Tissue - Progress: Progressing toward goal Increase Granulation Tissue to: 100 Decrease Length/Width/Depth by (cm): 5xm 5 xm Decrease Length/Width/Depth - Progress: Progressing toward goal Improve Drainage Characteristics: Min Improve Drainage Characteristics - Progress: Progressing toward goal Patient/Family will be able to : verbalize the importance of wearing compression hose Patient/Family Instruction Goal - Progress: Met Additional Wound Therapy Goal: instructed in self lymph techniques. Lymph fluid to be decreased by 50% Additional Wound Therapy Goal - Progress: Progressing toward goal Goals/treatment plan/discharge plan were made with and agreed upon by patient/family: Yes Wound Therapy - Potential for Goals: Good NEW Goal HEEL wound to be 80% granulation. Problem List Patient Active Problem List   Diagnosis Date Noted  . WEAKNESS 06/05/2008  . DYSPNEA ON EXERTION 11/15/2007  . CATARACT NOS 02/15/2007  . DIABETES MELLITUS, TYPE II, CONTROLLED, WITH COMPLICATIONS 08/24/2006  . RENAL  FAILURE, ACUTE 07/30/2006  . HYPERLIPIDEMIA 04/26/2006  . ANEMIA-NOS 04/26/2006  . HYPERTENSION 04/26/2006  . KIDNEY DISEASE, CHRONIC, STAGE III 04/26/2006  . CEREBROVASCULAR ACCIDENT, HX OF 04/26/2006    GP    Gauri Galvao,CINDY 10/14/2012, 11:40 AM

## 2012-10-17 ENCOUNTER — Ambulatory Visit (HOSPITAL_COMMUNITY)
Admission: RE | Admit: 2012-10-17 | Discharge: 2012-10-17 | Disposition: A | Discharge status: Home / Self Care | Payer: Medicare Other | Source: Ambulatory Visit | Attending: Family Medicine | Admitting: Family Medicine

## 2012-10-17 NOTE — Progress Notes (Signed)
Physical Therapy - Wound Therapy  Treatment   Patient Details  Name: Steven [name] MRN: [id number] Date of Birth: 01/01/1941  Today's Date: 10/17/2012 Time: 1025-1130 Time Calculation (min): 65 min  Visit#: 8 of 10  Re-eval: 10/31/12  Subjective Subjective Assessment Subjective: Pt .states his leg is draining less and is becoming less sensitive.  States Dr. Sudie Bailey never called him about his heel wound (See 10/12/12 note) and is out of the office all this week.  Pain Assessment Pain Assessment Pain Assessment: No/denies pain  Wound Therapy Wound 02/11/12 Contact dermatitis Leg medial aspect  4 different wounds the largest is 7cm x .5 appears backwad L with distal width being 2.5 w no depth.  All wounds covered. (Active)     Wound 06/12/12 Other (Comment)  Right water filled blistering over lower rt leg (Active)     Wound 06/16/12 Abrasion(s) Thigh Right;Lateral Scabbed area to right lateral thigh with scabbed area (Active)     Wound 09/30/12 Other (Comment) Leg Right wound covers the anterior aspect of the pt's leg and wraps laterally towards the posterior aspect. (Active)  Site / Wound Assessment Pink;Yellow 10/17/2012 12:00 PM  % Wound base Red or Granulating 85% 10/17/2012 12:00 PM  % Wound base Yellow 15% 10/17/2012 12:00 PM  % Wound base Black 0% 10/17/2012 12:00 PM  % Wound base Other (Comment) 0% 10/17/2012 12:00 PM  Peri-wound Assessment Edema 10/17/2012 12:00 PM  Wound Length (cm) 18 cm 09/30/2012  5:00 PM  Wound Width (cm) 22 cm 09/30/2012  5:00 PM  Margins Attached edges (approximated) 10/17/2012 12:00 PM  Closure None 10/17/2012 12:00 PM  Drainage Amount Moderate 10/12/2012  2:14 PM  Drainage Description Serous;No odor 10/17/2012 12:00 PM  Non-staged Wound Description Not applicable 10/17/2012 12:00 PM  Treatment Cleansed;Debridement (Selective) 10/17/2012 12:00 PM  Dressing Type Alginate;Impregnated gauze (bismuth) 10/17/2012 12:00 PM  Dressing Changed Changed 10/17/2012  12:00 PM  Dressing Status Clean;Dry;Intact 10/17/2012 12:00 PM     Wound 10/05/12 Other (Comment) Heel Right stage 2 pressure ulcer (Active)  Site / Wound Assessment Yellow;Pink 10/17/2012 12:00 PM  % Wound base Red or Granulating 5% 10/17/2012 12:00 PM  % Wound base Yellow 95% 10/17/2012 12:00 PM  % Wound base Black 0% 10/17/2012 12:00 PM  % Wound base Other (Comment) 0% 10/17/2012 12:00 PM  Peri-wound Assessment Edema 10/17/2012 12:00 PM  Wound Length (cm) 3 cm 10/14/2012 11:26 AM  Wound Width (cm) 3 cm 10/14/2012 11:26 AM  Wound Depth (cm) 3 cm 10/14/2012 11:26 AM  Closure None 10/17/2012 12:00 PM  Drainage Amount Moderate 10/17/2012 12:00 PM  Drainage Description Serous 10/17/2012 12:00 PM  Non-staged Wound Description Full thickness 10/17/2012 12:00 PM  Treatment Debridement (Selective) 10/17/2012 12:00 PM  Dressing Type Silver hydrofiber 10/17/2012 12:00 PM  Dressing Changed Changed 10/17/2012 12:00 PM  Dressing Status Clean;Dry 10/17/2012 12:00 PM   Hydrotherapy Pulsed lavage therapy - wound location: heel Pulsed Lavage with Suction (psi): 4 psi Pulsed Lavage with Suction - Normal Saline Used: 1000 mL Pulsed Lavage Tip: Tip with splash shield Selective Debridement Selective Debridement - Location:  LE and heel wound Selective Debridement - Tools Used: Forceps;Scalpel;Scissors Selective Debridement - Tissue Removed: slough   MLD to RLE routing fluid to R axillary nodes f/b short stretch compression bandaging  Physical Therapy Assessment and Plan Wound Therapy - Assess/Plan/Recommendations Wound Therapy - Clinical Statement: Less odor and drainage of heel wound/ bandages not saturated.  No odor today.  Increased granulation lateral LE.  Overall lymph fluid  decompressing well, however continued swelling into foot, ankle and toes.  Began short stretch bandages as well as toe wraps today to help with decompression. Hydrotherapy Plan: Debridement;Dressing change;Patient/family education Wound  Therapy - Frequency: 3X / week Wound Plan: Continue with MLD, woundcare and compression bandaging.  Will continue with coban until drainage decreases then switch to juxtafit circaid garment over dressings.  Order faxed to MD for another garment.  Await further MD orders.       Problem List Patient Active Problem List   Diagnosis Date Noted  . WEAKNESS 06/05/2008  . DYSPNEA ON EXERTION 11/15/2007  . CATARACT NOS 02/15/2007  . DIABETES MELLITUS, TYPE II, CONTROLLED, WITH COMPLICATIONS 08/24/2006  . RENAL FAILURE, ACUTE 07/30/2006  . HYPERLIPIDEMIA 04/26/2006  . ANEMIA-NOS 04/26/2006  . HYPERTENSION 04/26/2006  . KIDNEY DISEASE, CHRONIC, STAGE III 04/26/2006  . CEREBROVASCULAR ACCIDENT, HX OF 04/26/2006     [name], PTA/CLT 10/17/2012, 12:18 PM

## 2012-10-19 ENCOUNTER — Inpatient Hospital Stay (HOSPITAL_COMMUNITY)
Admission: RE | Admit: 2012-10-19 | Discharge: 2012-10-19 | Disposition: A | Discharge status: Home / Self Care | Payer: Medicare Other | Source: Ambulatory Visit | Attending: Physical Therapy | Admitting: Physical Therapy

## 2012-10-19 ENCOUNTER — Ambulatory Visit (HOSPITAL_COMMUNITY)
Admission: RE | Admit: 2012-10-19 | Discharge: 2012-10-19 | Disposition: A | Discharge status: Home / Self Care | Payer: Medicare Other | Source: Ambulatory Visit | Attending: Family Medicine | Admitting: Family Medicine

## 2012-10-19 NOTE — Progress Notes (Signed)
Physical Therapy - Wound Therapy  Treatment   Patient Details  Name: Steven [name] MRN: [id number] Date of Birth: 01/10/1941  Today's Date: 10/19/2012 Time: 4098-1191 Time Calculation (min): 72 min Visit#: 9 of 10  Re-eval: 10/31/12 Charges:  Manual 45', deb<20cm   Subjective Subjective Assessment Subjective: Pt reports his leg feels really good.  States it is less sensitive to touch.  Pain Assessment Pain Assessment Pain Assessment: No/denies pain  Wound Therapy Wound 02/11/12 Contact dermatitis Leg medial aspect  4 different wounds the largest is 7cm x .5 appears backwad L with distal width being 2.5 w no depth.  All wounds covered. (Active)     Wound 06/12/12 Other (Comment)  Right water filled blistering over lower rt leg (Active)     Wound 06/16/12 Abrasion(s) Thigh Right;Lateral Scabbed area to right lateral thigh with scabbed area (Active)     Wound 09/30/12 Other (Comment) Leg Right wound covers the anterior aspect of the pt's leg and wraps laterally towards the posterior aspect. (Active)  Site / Wound Assessment Pink;Yellow 10/19/2012 11:48 AM  % Wound base Red or Granulating 90% 10/19/2012 11:48 AM  % Wound base Yellow 10% 10/19/2012 11:48 AM  % Wound base Black 0% 10/17/2012 12:00 PM  % Wound base Other (Comment) 0% 10/17/2012 12:00 PM  Peri-wound Assessment Edema 10/17/2012 12:00 PM  Wound Length (cm) 18 cm 09/30/2012  5:00 PM  Wound Width (cm) 22 cm 09/30/2012  5:00 PM  Margins Attached edges (approximated) 10/19/2012 11:48 AM  Closure None 10/19/2012 11:48 AM  Drainage Amount Moderate 10/12/2012  2:14 PM  Drainage Description Serous 10/19/2012 11:48 AM  Non-staged Wound Description Not applicable 10/19/2012 11:48 AM  Treatment Cleansed;Debridement (Selective) 10/19/2012 11:48 AM  Dressing Type Moist to moist with hydrogel 10/19/2012 11:48 AM  Dressing Changed Changed 10/19/2012 11:48 AM  Dressing Status Clean;Dry;Intact 10/19/2012 11:48 AM     Wound 10/05/12 Other  (Comment) Heel Right stage 2 pressure ulcer (Active)  Site / Wound Assessment Yellow;Pink 10/19/2012 11:48 AM  % Wound base Red or Granulating 5% 10/19/2012 11:48 AM  % Wound base Yellow 95% 10/19/2012 11:48 AM  % Wound base Black 0% 10/19/2012 11:48 AM  % Wound base Other (Comment) 0% 10/19/2012 11:48 AM  Peri-wound Assessment Edema 10/17/2012 12:00 PM  Wound Length (cm) 3 cm 10/14/2012 11:26 AM  Wound Width (cm) 3 cm 10/14/2012 11:26 AM  Wound Depth (cm) 3 cm 10/14/2012 11:26 AM  Closure None 10/19/2012 11:48 AM  Drainage Amount Moderate 10/19/2012 11:48 AM  Drainage Description Purulent;Green;Odor 10/19/2012 11:48 AM  Non-staged Wound Description Full thickness 10/19/2012 11:48 AM  Treatment Cleansed;Debridement (Selective) 10/19/2012 11:48 AM  Dressing Type Silver hydrofiber 10/19/2012 11:48 AM  Dressing Changed Changed 10/19/2012 11:48 AM  Dressing Status Clean;Dry;Intact 10/19/2012 11:48 AM   Hydrotherapy Pulsed lavage therapy - wound location: heel Pulsed Lavage with Suction (psi): 4 psi Pulsed Lavage with Suction - Normal Saline Used: 1000 mL Pulsed Lavage Tip: Tip with splash shield Selective Debridement Selective Debridement - Location:  LE and heel wound Selective Debridement - Tools Used: Forceps;Scalpel;Scissors Selective Debridement - Tissue Removed: slough   Physical Therapy Assessment and Plan Wound Therapy - Assess/Plan/Recommendations Wound Therapy - Clinical Statement: greenish drainage today from heel wound with odor.  Contacted MD office (had contacted last week and spoke to MD who was suppose to contact pt.).  Feel pt. needs an antibiotic and xray to rule out osteomyelitis.  MLD performed today routing fluid to Lt axillary nodes.  Drastic reduction and  compression in toes all way up RLE.    continued with toe wraps, however used coban for outer dressing today due to lymph therapist will not be available next visit to do bandaging.  Will resume bandaging next week. Wound Plan:  Continue with MLD, woundcare and compression bandaging.  Will continue with coban until drainage decreases then switch to juxtafit circaid garment over dressings.  Order faxed to MD for another garment.  Await further MD orders.  Re-evaluate next visit.       Problem List Patient Active Problem List   Diagnosis Date Noted  . WEAKNESS 06/05/2008  . DYSPNEA ON EXERTION 11/15/2007  . CATARACT NOS 02/15/2007  . DIABETES MELLITUS, TYPE II, CONTROLLED, WITH COMPLICATIONS 08/24/2006  . RENAL FAILURE, ACUTE 07/30/2006  . HYPERLIPIDEMIA 04/26/2006  . ANEMIA-NOS 04/26/2006  . HYPERTENSION 04/26/2006  . KIDNEY DISEASE, CHRONIC, STAGE III 04/26/2006  . CEREBROVASCULAR ACCIDENT, HX OF 04/26/2006     [name], PTA/CLT 10/19/2012, 11:56 AM

## 2012-10-21 ENCOUNTER — Ambulatory Visit (HOSPITAL_COMMUNITY)
Admission: RE | Admit: 2012-10-21 | Discharge: 2012-10-21 | Disposition: A | Discharge status: Home / Self Care | Payer: Medicare Other | Source: Ambulatory Visit | Attending: Family Medicine | Admitting: Family Medicine

## 2012-10-21 ENCOUNTER — Ambulatory Visit (HOSPITAL_COMMUNITY): Payer: Medicare Other | Admitting: Physical Therapy

## 2012-10-21 NOTE — Progress Notes (Addendum)
Physical Therapy - Wound Therapy Reassessment  Treatment  charge deb 1010-1045; lymphmanual 1045-1100 Patient Details  Name: Steven [name] MRN: [id number] Date of Birth: Apr 20, 1941  Today's Date: 10/21/2012 Time: 1010-1105 Time Calculation (min): 55 min  Visit#: 10 of 10   Subjective Subjective Assessment Subjective: Pt 40  minutes late for his appointment states he thought that his appointment was at 10:15 Patient and Family Stated Goals: decreased pain, wound to heal and to control the swelling of his leg. Prior Treatments: self dressing  Pain Assessment Pain Assessment Pain Assessment: No/denies pain  Wound Therapy Wound 02/11/12 Contact dermatitis Leg medial aspect  4 different wounds the largest is 7cm x .5 appears backwad L with distal width being 2.5 w no depth.  All wounds covered. (Active)     Wound 06/12/12 Other (Comment)  Right water filled blistering over lower rt leg (Active)     Wound 06/16/12 Abrasion(s) Thigh Right;Lateral Scabbed area to right lateral thigh with scabbed area (Active)     Wound 09/30/12 Other (Comment) Leg Right wound covers the anterior aspect of the pt's leg and wraps laterally towards the posterior aspect. (Active)  Site / Wound Assessment Yellow;Pink 10/21/2012 12:27 PM  % Wound base Red or Granulating 90% 10/21/2012 12:27 PM  % Wound base Yellow 10% 10/21/2012 12:27 PM  % Wound base Black 0% 10/21/2012 12:27 PM  % Wound base Other (Comment) 0% 10/21/2012 12:27 PM  Peri-wound Assessment Edema 10/21/2012 12:27 PM  Wound Length (cm) 18 cm 09/30/2012  5:00 PM  Wound Width (cm) 22 cm 09/30/2012  5:00 PM  Margins Attached edges (approximated) 10/21/2012 12:27 PM  Closure None 10/21/2012 12:27 PM  Drainage Amount Moderate 10/21/2012 12:27 PM  Drainage Description Serous 10/21/2012 12:27 PM  Non-staged Wound Description Not applicable 10/21/2012 12:27 PM  Treatment Cleansed;Debridement (Selective) 10/19/2012 11:48 AM  Dressing Type Moist to moist 10/21/2012  12:27 PM  Dressing Changed Changed 10/19/2012 11:48 AM  Dressing Status Clean;Dry;Intact 10/21/2012 12:27 PM     Wound 10/05/12 Other (Comment) Heel Right stage 2 pressure ulcer (Active)  Site / Wound Assessment Pink;Yellow 10/21/2012 12:27 PM  % Wound base Red or Granulating 10% 10/21/2012 12:27 PM  % Wound base Yellow 90% 10/21/2012 12:27 PM  % Wound base Black 0% 10/21/2012 12:27 PM  % Wound base Other (Comment) 0% 10/21/2012 12:27 PM  Peri-wound Assessment Edema 10/21/2012 12:27 PM  Wound Length (cm) 3 cm 10/14/2012 11:26 AM  Wound Width (cm) 3 cm 10/14/2012 11:26 AM  Wound Depth (cm) 3 cm 10/14/2012 11:26 AM  Closure None 10/21/2012 12:27 PM  Drainage Amount Moderate 10/21/2012 12:27 PM  Drainage Description Purulent;Odor 10/21/2012 12:27 PM  Non-staged Wound Description Full thickness 10/21/2012 12:27 PM  Treatment Cleansed;Debridement (Selective) 10/19/2012 11:48 AM  Dressing Type Alginate 10/21/2012 12:27 PM  Dressing Changed Changed 10/21/2012 12:27 PM  Dressing Status Clean;Dry;Intact 10/19/2012 11:48 AM   Hydrotherapy Pulsed lavage therapy - wound location: heel Pulsed Lavage with Suction (psi): 4 psi Pulsed Lavage with Suction - Normal Saline Used: 1000 mL Pulsed Lavage Tip: Tip with splash shield Selective Debridement Selective Debridement - Location:  LE and heel wound Selective Debridement - Tools Used: Forceps;Scissors   Physical Therapy Assessment and Plan Wound Therapy - Assess/Plan/Recommendations Wound Therapy - Clinical Statement: Drainage from heel is no longer green.  PT recieved manual lymph drainage routing to L axillary and R groin.  Wound Therapy - Functional Problem List: improving pain but pt is still having significant decline in mobility may benefit  from mobility and strengthening once wound care is done. Factors Delaying/Impairing Wound Healing: Immobility;Multiple medical problems;Vascular compromise;Diabetes Mellitus Hydrotherapy Plan: Debridement;Dressing  change;Patient/family education Wound Therapy - Frequency: 3X / week Wound Plan: Continue with MLD, woundcare and compression bandaging.  Will continue with coban until drainage decreases then switch to juxtafit circaid garment over dressings.  Order faxed to MD for another garment.  Await further MD orders.     Wound need to be measured, and circumference of LE needs to be taken as pt was late.  Reassess letter to MD at this point. Goals  progressing  Problem List Patient Active Problem List   Diagnosis Date Noted  . WEAKNESS 06/05/2008  . DYSPNEA ON EXERTION 11/15/2007  . CATARACT NOS 02/15/2007  . DIABETES MELLITUS, TYPE II, CONTROLLED, WITH COMPLICATIONS 08/24/2006  . RENAL FAILURE, ACUTE 07/30/2006  . HYPERLIPIDEMIA 04/26/2006  . ANEMIA-NOS 04/26/2006  . HYPERTENSION 04/26/2006  . KIDNEY DISEASE, CHRONIC, STAGE III 04/26/2006  . CEREBROVASCULAR ACCIDENT, HX OF 04/26/2006    GP Functional Assessment Tool Used: clinical judgement Functional Limitation: Other PT primary Other PT Primary Current Status (G2952): At least 60 percent but less than 80 percent impaired, limited or restricted Other PT Primary Goal Status (W4132): At least 20 percent but less than 40 percent impaired, limited or restricted  Latresha Yahr,CINDY 10/21/2012, 12:38 PM

## 2012-10-25 ENCOUNTER — Ambulatory Visit (HOSPITAL_COMMUNITY)
Admission: RE | Admit: 2012-10-25 | Discharge: 2012-10-25 | Disposition: A | Discharge status: Home / Self Care | Payer: Medicare Other | Source: Ambulatory Visit | Attending: Family Medicine | Admitting: Family Medicine

## 2012-10-25 NOTE — Progress Notes (Signed)
Physical Therapy - Wound Therapy  Treatment/Reevaluation   Patient Details  Name: Steven [name] MRN: [id number] Date of Birth: 1941-03-10  Today's Date: 10/25/2012 Time: 1030-1150 Time Calculation (min): 80 min Charge:  Manual, debridement Visit#: 11 of 19  Re-eval:  11/22/2010  Subjective Subjective Assessment Subjective: Pt has no complaint Patient and Family Stated Goals: decreased pain, wound to heal and to control the swelling of his leg. Date of Onset: 09/21/12 Prior Treatments: self dressing  Pain Assessment Pain Assessment Pain Score:   2 Pain Type: Chronic pain Pain Location: Leg Pain Orientation: Right Pain Onset: On-going Patients Stated Pain Goal: 2  Wound Therapy Wound 02/11/12 Contact dermatitis Leg medial aspect  4 different wounds the largest is 7cm x .5 appears backwad L with distal width being 2.5 w no depth.  All wounds covered. (Active)     Wound 06/12/12 Other (Comment)  Right water filled blistering over lower rt leg (Active)     Wound 06/16/12 Abrasion(s) Thigh Right;Lateral Scabbed area to right lateral thigh with scabbed area (Active)     Wound 09/30/12 Other (Comment) Leg Right wound covers the anterior aspect of the pt's leg and wraps laterally towards the posterior aspect. (Active)  Site / Wound Assessment Yellow;Pink 10/25/2012 12:01 PM  % Wound base Red or Granulating 90% was 0 10/25/2012 12:01 PM  % Wound base Yellow 5% was 100% 10/25/2012 12:01 PM  % Wound base Black 0% 10/25/2012 12:01 PM  % Wound base Other (Comment) 0% 10/25/2012 12:01 PM  Peri-wound Assessment Edema 10/25/2012 12:01 PM  Wound Length (cm) 5 cm was 18xm 10/25/2012 12:01 PM  Wound Width (cm) 2.7 cm was 22 cm 10/25/2012 12:01 PM  Margins Attached edges (approximated) 10/25/2012 12:01 PM  Closure None 10/25/2012 12:01 PM  Drainage Amount Minimal was copious 10/25/2012 12:01 PM  Drainage Description Serous 10/25/2012 12:01 PM  Non-staged Wound Description Not applicable 10/21/2012  12:27 PM  Treatment Cleansed;Debridement (Selective) 10/25/2012 12:01 PM  Dressing Type Moist to moist 10/21/2012 12:27 PM  Dressing Changed Changed 10/19/2012 11:48 AM  Dressing Status Clean;Dry;Intact 10/21/2012 12:27 PM     Wound 10/05/12 Other (Comment) Heel Right stage 2 pressure ulcer (Active)  Site / Wound Assessment Pink;Yellow 10/25/2012 12:01 PM  % Wound base Red or Granulating 30% 10/25/2012 12:01 PM  % Wound base Yellow 70% 10/25/2012 12:01 PM  % Wound base Black 0% 10/25/2012 12:01 PM  % Wound base Other (Comment) 0% 10/25/2012 12:01 PM  Peri-wound Assessment Edema 10/25/2012 12:01 PM  Wound Length (cm) 2 cm 10/25/2012 12:01 PM  Wound Width (cm) 2.7 cm 10/25/2012 12:01 PM  Wound Depth (cm) 2 cm 10/25/2012 12:01 PM  Closure None 10/25/2012 12:01 PM  Drainage Amount Moderate 10/21/2012 12:27 PM  Drainage Description Serous;Odor 10/25/2012 12:01 PM  Non-staged Wound Description Full thickness 10/25/2012 12:01 PM  Treatment Cleansed;Debridement (Selective) 10/25/2012 12:01 PM  Dressing Type Silver hydrofiber 10/25/2012 12:01 PM  Dressing Changed Changed 10/25/2012 12:01 PM  Dressing Status Clean;Dry 10/25/2012 12:01 PM   Hydrotherapy Pulsed lavage therapy - wound location: heel Pulsed Lavage with Suction (psi): 4 psi Pulsed Lavage with Suction - Normal Saline Used: 1000 mL Pulsed Lavage Tip: Tip with splash shield Selective Debridement Selective Debridement - Location: Lateral wound and heel Selective Debridement - Tools Used: Forceps Selective Debridement - Tissue Removed: slough   Physical Therapy Assessment and Plan Wound Therapy - Assess/Plan/Recommendations Wound Therapy - Clinical Statement: Pt anterior wound is now only on the lateral aspect.  Heel wound was  not initally assessed ,(assessed at third visit), is improving as well although wound does have significant depth.  Pt volume has decreased from 6500 total volume ( 20,422 sum of square)  from the first, visit to 3553 today.  Pt  volume is at prior level will discontinue lymph massage and go with wrapping only. Wound Therapy - Functional Problem List: improving pain but pt is still having significant decline in mobility may benefit from mobility and strengthening once wound care is done. Factors Delaying/Impairing Wound Healing: Immobility;Multiple medical problems;Vascular compromise;Diabetes Mellitus Hydrotherapy Plan: Debridement;Dressing change;Patient/family education Wound Therapy - Frequency: 3X / week (2x /week for four more weeks) Wound Therapy - Current Recommendations: PT Wound Plan:  discontinue manual continue with wound care and compression bandaging.       Goals Wound Therapy Goals - Improve the function of patient's integumentary system by progressing the wound(s) through the phases of wound healing by: Decrease Necrotic Tissue to: 0 Decrease Necrotic Tissue - Progress: Progressing toward goal Increase Granulation Tissue to: 100 Increase Granulation Tissue - Progress: Progressing toward goal Decrease Length/Width/Depth by (cm): 5xm 5 xm Decrease Length/Width/Depth - Progress: Met Improve Drainage Characteristics: Min Improve Drainage Characteristics - Progress: Met Patient/Family will be able to : verbalize the importance of wearing compression hose Patient/Family Instruction Goal - Progress: Met Additional Wound Therapy Goal: instructed in self lymph techniques. Lymph fluid to be decreased by 50% Additional Wound Therapy Goal - Progress: Progressing toward goal Goals/treatment plan/discharge plan were made with and agreed upon by patient/family: Yes Time For Goal Achievement:  (4 weeks) Wound Therapy - Potential for Goals: Good  Problem List Patient Active Problem List   Diagnosis Date Noted  . WEAKNESS 06/05/2008  . DYSPNEA ON EXERTION 11/15/2007  . CATARACT NOS 02/15/2007  . DIABETES MELLITUS, TYPE II, CONTROLLED, WITH COMPLICATIONS 08/24/2006  . RENAL FAILURE, ACUTE 07/30/2006  .  HYPERLIPIDEMIA 04/26/2006  . ANEMIA-NOS 04/26/2006  . HYPERTENSION 04/26/2006  . KIDNEY DISEASE, CHRONIC, STAGE III 04/26/2006  . CEREBROVASCULAR ACCIDENT, HX OF 04/26/2006   Your signature is required to indicate approval of the treatment plan as stated above.  Please sign and return making a copy for your files.  You may hard copy or send electronically.  Please check one: ___1.  Approve of this plan  ___2.  Approve of this plan with the following changes.   ____________________________                             _____________ Physician                                                                      Date   GP    Ramiyah Mcclenahan,CINDY 10/25/2012, 12:16 PM

## 2012-10-26 ENCOUNTER — Telehealth (HOSPITAL_COMMUNITY): Payer: Self-pay | Admitting: Physical Therapy

## 2012-10-27 ENCOUNTER — Ambulatory Visit (HOSPITAL_COMMUNITY)
Admission: RE | Admit: 2012-10-27 | Discharge: 2012-10-27 | Disposition: A | Discharge status: Home / Self Care | Payer: Medicare Other | Source: Ambulatory Visit | Attending: Family Medicine | Admitting: Family Medicine

## 2012-10-27 ENCOUNTER — Ambulatory Visit (HOSPITAL_COMMUNITY): Payer: Medicare Other | Admitting: Physical Therapy

## 2012-10-27 NOTE — Progress Notes (Signed)
Physical Therapy - Wound Therapy  Treatment   Patient Details  Name: Steven [name] MRN: [id number] Date of Birth: [birth year]  Today's Date: 10/27/2012 Time: 4098-1191 Time Calculation (min): 50 min Visit#: 12 of 19   Subjective Subjective Assessment Subjective: Pt reports he returns to Dr. Sudie Bailey tomorrow.  Pt. brought juxtalite garment today to place over dressings for easier removal for MD to inspect wound.    Pain Assessment Pain Assessment Pain Assessment: No/denies pain  Wound Therapy Wound 02/11/12 Contact dermatitis Leg medial aspect  4 different wounds the largest is 7cm x .5 appears backwad L with distal width being 2.5 w no depth.  All wounds covered. (Active)     Wound 06/12/12 Other (Comment)  Right water filled blistering over lower rt leg (Active)     Wound 06/16/12 Abrasion(s) Thigh Right;Lateral Scabbed area to right lateral thigh with scabbed area (Active)     Wound 09/30/12 Other (Comment) Leg Right wound covers the anterior aspect of the pt's leg and wraps laterally towards the posterior aspect. (Active)  Site / Wound Assessment Yellow;Pink 10/27/2012 11:36 AM  % Wound base Red or Granulating 90% 10/27/2012 11:36 AM  % Wound base Yellow 5% 10/27/2012 11:36 AM  % Wound base Black 0% 10/27/2012 11:36 AM  % Wound base Other (Comment) 0% 10/27/2012 11:36 AM  Peri-wound Assessment Edema 10/27/2012 11:36 AM  Wound Length (cm) 5 cm  (was 18 cm) 10/27/2012 11:36 AM  Wound Width (cm) 2.7 cm (was 22 cm) 10/27/2012 11:36 AM  Margins Attached edges (approximated) 10/27/2012 11:36 AM  Closure None 10/27/2012 11:36 AM  Drainage Amount Minimal 10/27/2012 11:36 AM  Drainage Description Serous 10/27/2012 11:36 AM  Non-staged Wound Description Not applicable 10/21/2012 12:27 PM  Treatment Cleansed;Debridement (Selective) 10/27/2012 11:36 AM  Dressing Type Impregnated gauze (bismuth) 10/27/2012 11:36 AM  Dressing Changed Changed 10/27/2012 11:36 AM  Dressing Status Clean;Dry;Intact  10/27/2012 11:36 AM     Wound 10/05/12 Other (Comment) Heel Right stage 2 pressure ulcer (Active)  Site / Wound Assessment Pink;Yellow 10/27/2012 11:36 AM  % Wound base Red or Granulating 30% 10/27/2012 11:36 AM  % Wound base Yellow 70% 10/27/2012 11:36 AM  % Wound base Black 0% 10/27/2012 11:36 AM  % Wound base Other (Comment) 0% 10/27/2012 11:36 AM  Peri-wound Assessment Edema 10/27/2012 11:36 AM  Wound Length (cm) 2 cm    10/27/2012 11:36 AM  Wound Width (cm) 2.7 cm 10/27/2012 11:36 AM  Wound Depth (cm) 2 cm, but unknown if deeper 10/27/2012 11:36 AM  Closure None 10/27/2012 11:36 AM  Drainage Amount Moderate 10/21/2012 12:27 PM  Drainage Description Odor;Serosanguineous;Purulent 10/27/2012 11:36 AM  Non-staged Wound Description Full thickness 10/27/2012 11:36 AM  Treatment Hydrotherapy (Pulse lavage);Debridement (Selective) 10/27/2012 11:36 AM  Dressing Type Silver hydrofiber 10/27/2012 11:36 AM  Dressing Changed Changed 10/27/2012 11:36 AM  Dressing Status Clean;Dry 10/27/2012 11:36 AM   Hydrotherapy Pulsed lavage therapy - wound location: heel Pulsed Lavage with Suction (psi): 4 psi Pulsed Lavage with Suction - Normal Saline Used: 1000 mL Pulsed Lavage Tip: Tip with splash shield Selective Debridement Selective Debridement - Location: Lateral wound and heel Selective Debridement - Tools Used: Forceps Selective Debridement - Tissue Removed: slough   Physical Therapy Assessment and Plan Wound Therapy - Clinical Statement: Heel wound continues to have depth with increased drainage and odor.  Concern if antibiotic or xray is needed.  Rt. LE volume has decreased from 6500 cc the first visit to 3553 cc since beginning MLD and compression bandaging with  short stretch.  Pt. Given new script to get a second lymph compression garment for [name] so there will be no gaps in wearing.  Pt instructed to make appt. At [facility] to get his garment. Wound Therapy - Frequency: 3X / week Wound Plan:  Continue  with wound care and compression bandaging to promote closure; await further MD orders.   [name], PTA/CLT 10/27/2012, 11:45 AM

## 2012-11-01 ENCOUNTER — Ambulatory Visit (HOSPITAL_COMMUNITY)
Admission: RE | Admit: 2012-11-01 | Discharge: 2012-11-01 | Disposition: A | Discharge status: Home / Self Care | Payer: Medicare Other | Source: Ambulatory Visit | Attending: Family Medicine | Admitting: Family Medicine

## 2012-11-01 DIAGNOSIS — S81009A Unspecified open wound, unspecified knee, initial encounter: Secondary | ICD-10-CM | POA: Insufficient documentation

## 2012-11-01 DIAGNOSIS — IMO0001 Reserved for inherently not codable concepts without codable children: Secondary | ICD-10-CM | POA: Insufficient documentation

## 2012-11-01 DIAGNOSIS — M7989 Other specified soft tissue disorders: Secondary | ICD-10-CM | POA: Insufficient documentation

## 2012-11-01 NOTE — Progress Notes (Signed)
Physical Therapy - Wound Therapy  Treatment   Patient Details  Name: Steven [name] MRN: [id number] Date of Birth: Oct 07, 1940  Today's Date: 11/01/2012 Time: 5784-6962 Time Calculation (min): 73 min Visit#: 13 of 19  Charges:  Manual 30', deb <20cm  Subjective Subjective Assessment Subjective: Pt states MD wants him to return to him in 3 weeks and to continue current treatment.  Heel wound had drained through juxtalite garment upon arrival.  Pt to make appt .at [facility] for end of this week or next for new LE garments.    Pain Assessment Pain Assessment Pain Assessment: No/denies pain  Wound Therapy Wound 02/11/12 Contact dermatitis Leg medial aspect  4 different wounds the largest is 7cm x .5 appears backwad L with distal width being 2.5 w no depth.  All wounds covered. (Active)     Wound 06/12/12 Other (Comment)  Right water filled blistering over lower rt leg (Active)     Wound 06/16/12 Abrasion(s) Thigh Right;Lateral Scabbed area to right lateral thigh with scabbed area (Active)     Wound 09/30/12 Other (Comment) Leg Right wound covers the anterior aspect of the pt's leg and wraps laterally towards the posterior aspect. (Active)  Site / Wound Assessment Yellow;Pink 11/01/2012  5:03 PM  % Wound base Red or Granulating 100% 11/01/2012  5:03 PM  % Wound base Yellow 0% 11/01/2012  5:03 PM  % Wound base Black 0% 11/01/2012  5:03 PM  % Wound base Other (Comment) 0% 11/01/2012  5:03 PM  Peri-wound Assessment Edema 11/01/2012  5:03 PM  Wound Length (cm) 5 cm 10/27/2012 11:36 AM  Wound Width (cm) 2.7 cm 10/27/2012 11:36 AM  Margins Attached edges (approximated) 11/01/2012  5:03 PM  Closure None 11/01/2012  5:03 PM  Drainage Amount Minimal 11/01/2012  5:03 PM  Drainage Description Serous 11/01/2012  5:03 PM  Non-staged Wound Description Not applicable 10/21/2012 12:27 PM  Treatment Cleansed;Debridement (Selective) 11/01/2012  5:03 PM  Dressing Type Impregnated gauze (bismuth) 11/01/2012  5:03 PM  Dressing  Changed Changed 11/01/2012  5:03 PM  Dressing Status Clean;Dry;Intact 11/01/2012  5:03 PM     Wound 10/05/12 Other (Comment) Heel Right stage 2 pressure ulcer (Active)  Site / Wound Assessment Pink;Yellow 11/01/2012  5:03 PM  % Wound base Red or Granulating 40% 11/01/2012  5:03 PM  % Wound base Yellow 60% 11/01/2012  5:03 PM  % Wound base Black 0% 11/01/2012  5:03 PM  % Wound base Other (Comment) 0% 11/01/2012  5:03 PM  Peri-wound Assessment Edema 11/01/2012  5:03 PM  Wound Length (cm) 2 cm 10/27/2012 11:36 AM  Wound Width (cm) 2.7 cm 10/27/2012 11:36 AM  Wound Depth (cm) 2 cm 10/27/2012 11:36 AM  Closure None 11/01/2012  5:03 PM  Drainage Amount Moderate 10/21/2012 12:27 PM  Drainage Description Odor;Serosanguineous;Purulent 11/01/2012  5:03 PM  Non-staged Wound Description Full thickness 11/01/2012  5:03 PM  Treatment Hydrotherapy (Pulse lavage);Debridement (Selective) 11/01/2012  5:03 PM  Dressing Type Silver hydrofiber 11/01/2012  5:03 PM  Dressing Changed Changed 11/01/2012  5:03 PM  Dressing Status Clean;Dry;Intact 11/01/2012  5:03 PM   Hydrotherapy Pulsed lavage therapy - wound location: heel Pulsed Lavage with Suction (psi): 4 psi Pulsed Lavage with Suction - Normal Saline Used: 1000 mL Pulsed Lavage Tip: Tip with splash shield Selective Debridement Selective Debridement - Location: Lateral wound and heel Selective Debridement - Tools Used: Forceps Selective Debridement - Tissue Removed: slough   Physical Therapy Assessment and Plan Wound Therapy - Assess/Plan/Recommendations Wound Therapy - Clinical  Statement: Lateral Rt. LE wound almost healed, 100% granulated.  Heel wound continues to fill in, however with purulent minimal drainage with odor. Noted swelling on dorsum of foot from not having enough compression with current garment.  Utilized toe wraps as well as Norton method of compression bandages using short stretch and foam.   Wound Plan: Continue with woundcare and compression bandaging.        Problem List Patient Active Problem List   Diagnosis Date Noted  . WEAKNESS 06/05/2008  . DYSPNEA ON EXERTION 11/15/2007  . CATARACT NOS 02/15/2007  . DIABETES MELLITUS, TYPE II, CONTROLLED, WITH COMPLICATIONS 08/24/2006  . RENAL FAILURE, ACUTE 07/30/2006  . HYPERLIPIDEMIA 04/26/2006  . ANEMIA-NOS 04/26/2006  . HYPERTENSION 04/26/2006  . KIDNEY DISEASE, CHRONIC, STAGE III 04/26/2006  . CEREBROVASCULAR ACCIDENT, HX OF 04/26/2006     [name], PTA/CLT 11/01/2012, 5:18 PM

## 2012-11-03 ENCOUNTER — Ambulatory Visit (HOSPITAL_COMMUNITY)
Admission: RE | Admit: 2012-11-03 | Discharge: 2012-11-03 | Disposition: A | Discharge status: Home / Self Care | Payer: Medicare Other | Source: Ambulatory Visit | Attending: Family Medicine | Admitting: Family Medicine

## 2012-11-03 NOTE — Progress Notes (Signed)
Physical Therapy - Wound Therapy  Treatment   Patient Details  Name: Steven [name] MRN: [id number] Date of Birth: Apr 03, 1941  Today's Date: 11/03/2012 Time: 1015-1110 Time Calculation (min): 55 min  Visit#: 14 of 19  Re-eval: 10/31/12  Subjective Subjective Assessment Subjective: Pt states that his leg is not near as tender as they were.  States the only time it bothers him is when therapist is debriding the wound. Patient and Family Stated Goals: decreased pain, wound to heal and to control the swelling of his leg. Date of Onset: 09/21/12 Prior Treatments: self dressing  Pain Assessment Pain Assessment Pain Assessment: No/denies pain  Wound Therapy Wound 02/11/12 Contact dermatitis Leg medial aspect  4 different wounds the largest is 7cm x .5 appears backwad L with distal width being 2.5 w no depth.  All wounds covered. (Active)     Wound 06/12/12 Other (Comment)  Right water filled blistering over lower rt leg (Active)     Wound 06/16/12 Abrasion(s) Thigh Right;Lateral Scabbed area to right lateral thigh with scabbed area (Active)     Wound 09/30/12 Other (Comment) Leg Right wound covers the anterior aspect of the pt's leg and wraps laterally towards the posterior aspect. (Active)  Site / Wound Assessment Pink 11/03/2012 11:19 AM  % Wound base Red or Granulating 100% 11/03/2012 11:19 AM  % Wound base Yellow 0% 11/01/2012  5:03 PM  % Wound base Black 0% 11/01/2012  5:03 PM  % Wound base Other (Comment) 0% 11/01/2012  5:03 PM  Peri-wound Assessment Edema 11/03/2012 11:19 AM  Wound Length (cm) 5 cm 10/27/2012 11:36 AM  Wound Width (cm) 2.7 cm 10/27/2012 11:36 AM  Margins Attached edges (approximated) 11/03/2012 11:19 AM  Closure None 11/03/2012 11:19 AM  Drainage Amount Minimal 11/01/2012  5:03 PM  Drainage Description Serous 11/03/2012 11:19 AM  Non-staged Wound Description Not applicable 11/03/2012 11:19 AM  Treatment Cleansed;Debridement (Selective) 11/01/2012  5:03 PM  Dressing Type  Impregnated gauze (bismuth) 11/03/2012 11:19 AM  Dressing Changed Changed 11/01/2012  5:03 PM  Dressing Status Clean;Dry;Intact 11/03/2012 11:19 AM     Wound 10/05/12 Other (Comment) Heel Right stage 2 pressure ulcer (Active)  Site / Wound Assessment Pink;Yellow 11/03/2012 11:19 AM  % Wound base Red or Granulating 50% 11/03/2012 11:19 AM  % Wound base Yellow 50% 11/03/2012 11:19 AM  % Wound base Black 0% 11/03/2012 11:19 AM  % Wound base Other (Comment) 0% 11/03/2012 11:19 AM  Peri-wound Assessment Edema 11/03/2012 11:19 AM  Wound Length (cm) 2 cm 10/27/2012 11:36 AM  Wound Width (cm) 2.7 cm 10/27/2012 11:36 AM  Wound Depth (cm) 2 cm 10/27/2012 11:36 AM  Closure None 11/03/2012 11:19 AM  Drainage Amount Moderate 11/03/2012 11:19 AM  Drainage Description Odor;Serosanguineous;Purulent 11/03/2012 11:19 AM  Non-staged Wound Description Full thickness 11/03/2012 11:19 AM  Treatment Cleansed;Hydrotherapy (Pulse lavage) 11/03/2012 11:19 AM  Dressing Type Silver hydrofiber 11/03/2012 11:19 AM  Dressing Changed Changed 11/01/2012  5:03 PM  Dressing Status Clean;Dry;Intact 11/03/2012 11:19 AM   Hydrotherapy Pulsed lavage therapy - wound location: heel Pulsed Lavage with Suction (psi): 4 psi Pulsed Lavage with Suction - Normal Saline Used: 1000 mL Pulsed Lavage Tip: Tip with splash shield Selective Debridement Selective Debridement - Location: Lateral wound and heel Selective Debridement - Tools Used: Forceps Selective Debridement - Tissue Removed: slough   Physical Therapy Assessment and Plan Wound Therapy - Assess/Plan/Recommendations Wound Therapy - Clinical Statement: Lateral Rt. LE wound almost healed, 100% granulated.  Heel wound continues to fill in, however  with purulent minimal drainage with odor. Pt compression using Jux Wound Therapy - Functional Problem List: improving pain but pt is still having significant decline in mobility may benefit from mobility and strengthening once wound care is done. Hydrotherapy Plan:  Debridement;Dressing change;Patient/family education Wound Therapy - Frequency: 3X / week Wound Therapy - Current Recommendations: PT Wound Plan: Continue with woundcare and compression bandaging.      Goals  progressing  Problem List Patient Active Problem List   Diagnosis Date Noted  . WEAKNESS 06/05/2008  . DYSPNEA ON EXERTION 11/15/2007  . CATARACT NOS 02/15/2007  . DIABETES MELLITUS, TYPE II, CONTROLLED, WITH COMPLICATIONS 08/24/2006  . RENAL FAILURE, ACUTE 07/30/2006  . HYPERLIPIDEMIA 04/26/2006  . ANEMIA-NOS 04/26/2006  . HYPERTENSION 04/26/2006  . KIDNEY DISEASE, CHRONIC, STAGE III 04/26/2006  . CEREBROVASCULAR ACCIDENT, HX OF 04/26/2006    GP Functional Assessment Tool Used: clinical judgement  RUSSELL,CINDY 11/03/2012, 11:25 AM

## 2012-11-08 ENCOUNTER — Ambulatory Visit (HOSPITAL_COMMUNITY)
Admission: RE | Admit: 2012-11-08 | Discharge: 2012-11-08 | Disposition: A | Discharge status: Home / Self Care | Payer: Medicare Other | Source: Ambulatory Visit | Attending: Family Medicine | Admitting: Family Medicine

## 2012-11-08 NOTE — Progress Notes (Signed)
Physical Therapy - Wound Therapy  Treatment   Patient Details  Name: Steven [name] MRN: [id number] Date of Birth: 1940-12-02  Today's Date: 11/08/2012 Time: 1015-1150 Time Calculation (min): 95 min Charge:  Debridement 1025-1110; manual [id number]; dress Visit#: 15 of 19  Re-eval: 10/31/12  Subjective Subjective Assessment Subjective: Pt states he continues to have decreased pain in his leg. Patient and Family Stated Goals: decreased pain, wound to heal and to control the swelling of his leg. Date of Onset: 09/21/12 Prior Treatments: self dressing  Pain Assessment Pain Assessment Pain Score:   1  Wound Therapy Wound 02/11/12 Contact dermatitis Leg medial aspect  4 different wounds the largest is 7cm x .5 appears backwad L with distal width being 2.5 w no depth.  All wounds covered. (Active)     Wound 06/12/12 Other (Comment)  Right water filled blistering over lower rt leg (Active)     Wound 06/16/12 Abrasion(s) Thigh Right;Lateral Scabbed area to right lateral thigh with scabbed area (Active)     Wound 09/30/12 Other (Comment) Leg Right wound covers the anterior aspect of the pt's leg and wraps laterally towards the posterior aspect. (Active)  Site / Wound Assessment Pink 11/08/2012  3:37 PM  % Wound base Red or Granulating 100% 11/08/2012  3:37 PM  % Wound base Yellow 0% 11/08/2012  3:37 PM  % Wound base Black 0% 11/08/2012  3:37 PM  % Wound base Other (Comment) 0% 11/01/2012  5:03 PM  Peri-wound Assessment Edema 11/08/2012  3:37 PM  Wound Length (cm) 5 cm 10/27/2012 11:36 AM  Wound Width (cm) 2.7 cm 10/27/2012 11:36 AM  Margins Attached edges (approximated) 11/08/2012  3:37 PM  Closure None 11/08/2012  3:37 PM  Drainage Amount Minimal 11/08/2012  3:37 PM  Drainage Description Serous 11/08/2012  3:37 PM  Non-staged Wound Description Not applicable 11/08/2012  3:37 PM  Treatment Cleansed 11/08/2012  3:37 PM  Dressing Type Impregnated gauze (bismuth) 11/08/2012  3:37 PM  Dressing  Changed Changed 11/01/2012  5:03 PM  Dressing Status Clean;Dry;Intact 11/08/2012  3:37 PM     Wound 10/05/12 Other (Comment) Heel Right stage 2 pressure ulcer (Active)  Site / Wound Assessment Pink;Yellow 11/08/2012  3:37 PM  % Wound base Red or Granulating 50% 11/08/2012  3:37 PM  % Wound base Yellow 50% 11/08/2012  3:37 PM  % Wound base Black 0% 11/03/2012 11:19 AM  % Wound base Other (Comment) 0% 11/03/2012 11:19 AM  Peri-wound Assessment Edema;Induration 11/08/2012  3:37 PM  Wound Length (cm) 2 cm 10/27/2012 11:36 AM  Wound Width (cm) 2.7 cm 10/27/2012 11:36 AM  Wound Depth (cm) 2 cm 10/27/2012 11:36 AM  Closure None 11/08/2012  3:37 PM  Drainage Amount Moderate 11/08/2012  3:37 PM  Drainage Description Purulent;Odor 11/08/2012  3:37 PM  Non-staged Wound Description Full thickness 11/08/2012  3:37 PM  Treatment Cleansed;Debridement (Selective);Hydrotherapy (Pulse lavage) 11/08/2012  3:37 PM  Dressing Type Silver hydrofiber 11/08/2012  3:37 PM  Dressing Changed Changed 11/08/2012  3:37 PM  Dressing Status Clean;Dry;Intact 11/08/2012  3:37 PM   Hydrotherapy Pulsed lavage therapy - wound location: heel Pulsed Lavage with Suction (psi): 4 psi Pulsed Lavage with Suction - Normal Saline Used: 1000 mL Pulsed Lavage Tip: Tip with splash shield Selective Debridement Selective Debridement - Location: heel Selective Debridement - Tools Used: Forceps;Scalpel Selective Debridement - Tissue Removed: slough   Physical Therapy Assessment and Plan Wound Therapy - Assess/Plan/Recommendations Wound Therapy - Clinical Statement: Pt compression sock for the heel has a run  in it.  Pt LE had decrased edema and his foot and toes had significant increased swelling;  Returned to short-stretch wrapping for toes and foot.  Pt had decongestive manual lymph massage to decrease LE edema prior to wrapping Wound Therapy - Functional Problem List: improving pain but pt is still having significant decline in mobility may benefit from  mobility and strengthening once wound care is done. Factors Delaying/Impairing Wound Healing: Immobility;Multiple medical problems;Vascular compromise;Diabetes Mellitus Hydrotherapy Plan: Debridement;Dressing change;Patient/family education Wound Therapy - Frequency: 3X / week Wound Therapy - Current Recommendations: PT Wound Plan: assess how short stretch and Jux works at keeping lymphedema under control.      Goals Wound Therapy Goals - Improve the function of patient's integumentary system by progressing the wound(s) through the phases of wound healing by: Decrease Necrotic Tissue to: 0 Decrease Necrotic Tissue - Progress: Progressing toward goal Increase Granulation Tissue to: 100 Increase Granulation Tissue - Progress: Progressing toward goal Decrease Length/Width/Depth by (cm): 5xm 5 xm Decrease Length/Width/Depth - Progress: Partly met (met for LE wound not for heel) Patient/Family Instruction Goal - Progress: Met Additional Wound Therapy Goal - Progress: Partly met Wound Therapy - Potential for Goals: Good  Problem List Patient Active Problem List   Diagnosis Date Noted  . WEAKNESS 06/05/2008  . DYSPNEA ON EXERTION 11/15/2007  . CATARACT NOS 02/15/2007  . DIABETES MELLITUS, TYPE II, CONTROLLED, WITH COMPLICATIONS 08/24/2006  . RENAL FAILURE, ACUTE 07/30/2006  . HYPERLIPIDEMIA 04/26/2006  . ANEMIA-NOS 04/26/2006  . HYPERTENSION 04/26/2006  . KIDNEY DISEASE, CHRONIC, STAGE III 04/26/2006  . CEREBROVASCULAR ACCIDENT, HX OF 04/26/2006    GP    RUSSELL,CINDY 11/08/2012, 3:44 PM

## 2012-11-10 ENCOUNTER — Ambulatory Visit (HOSPITAL_COMMUNITY)
Admission: RE | Admit: 2012-11-10 | Discharge: 2012-11-10 | Disposition: A | Discharge status: Home / Self Care | Payer: Medicare Other | Source: Ambulatory Visit | Attending: Family Medicine | Admitting: Family Medicine

## 2012-11-10 NOTE — Progress Notes (Signed)
Physical Therapy - Wound Therapy  Treatment   Patient Details  Name: Steven [name] MRN: [id number] Date of Birth: Oct 19, 1940  Today's Date: 11/10/2012 Time: 2841-3244 Time Calculation (min): 67 min Visit#: 16 of 19  Re-eval: 11/22/12 Charges:  Deb<20cm, manual 25'  Subjective Subjective Assessment Subjective: Pt states he has an appointment with [facility] tomorrow to get new compression garment.  Currentlty without pain or discomfort.  Reports less sensitivity around heel.  Pain Assessment Pain Assessment Pain Assessment: No/denies pain  Wound Therapy Wound 02/11/12 Contact dermatitis Leg medial aspect  4 different wounds the largest is 7cm x .5 appears backwad L with distal width being 2.5 w no depth.  All wounds covered. (Active)     Wound 06/12/12 Other (Comment)  Right water filled blistering over lower rt leg (Active)     Wound 06/16/12 Abrasion(s) Thigh Right;Lateral Scabbed area to right lateral thigh with scabbed area (Active)     Wound 09/30/12 Other (Comment) Leg Right wound covers the anterior aspect of the pt's leg and wraps laterally towards the posterior aspect. (Active)  Site / Wound Assessment Pink 11/10/2012 12:29 PM  % Wound base Red or Granulating 100% 11/10/2012 12:29 PM  % Wound base Yellow 0% 11/10/2012 12:29 PM  % Wound base Black 0% 11/10/2012 12:29 PM  % Wound base Other (Comment) 0% 11/01/2012  5:03 PM  Peri-wound Assessment Induration 11/10/2012 12:29 PM  Wound Length (cm) 5 cm 10/27/2012 11:36 AM  Wound Width (cm) 2.7 cm 10/27/2012 11:36 AM  Margins Attached edges (approximated) 11/10/2012 12:29 PM  Closure None 11/10/2012 12:29 PM  Drainage Amount Scant 11/10/2012 12:29 PM  Drainage Description Serous 11/10/2012 12:29 PM  Non-staged Wound Description Not applicable 11/08/2012  3:37 PM  Treatment Cleansed;Debridement (Selective) 11/10/2012 12:29 PM  Dressing Type Impregnated gauze (bismuth) 11/10/2012 12:29 PM  Dressing Changed Changed 11/01/2012  5:03 PM   Dressing Status Clean;Dry;Intact 11/08/2012  3:37 PM     Wound 10/05/12 Other (Comment) Heel Right stage 2 pressure ulcer (Active)  Site / Wound Assessment Pink;Yellow 11/10/2012 12:29 PM  % Wound base Red or Granulating 50% 11/10/2012 12:29 PM  % Wound base Yellow 50% 11/10/2012 12:29 PM  % Wound base Black 0% 11/03/2012 11:19 AM  % Wound base Other (Comment) 0% 11/03/2012 11:19 AM  Peri-wound Assessment Edema 11/10/2012 12:29 PM  Wound Length (cm) 2 cm 10/27/2012 11:36 AM  Wound Width (cm) 2.7 cm 10/27/2012 11:36 AM  Wound Depth (cm) 2 cm 10/27/2012 11:36 AM  Closure None 11/10/2012 12:29 PM  Drainage Amount Moderate 11/10/2012 12:29 PM  Drainage Description Purulent;No odor 11/10/2012 12:29 PM  Non-staged Wound Description Full thickness 11/08/2012  3:37 PM  Treatment Hydrotherapy (Pulse lavage);Debridement (Selective) 11/10/2012 12:29 PM  Dressing Type Silver hydrofiber 11/10/2012 12:29 PM  Dressing Changed Changed 11/10/2012 12:29 PM  Dressing Status Clean;Dry;Intact 11/10/2012 12:29 PM   Hydrotherapy Pulsed lavage therapy - wound location: Rt. Heel Pulsed Lavage with Suction (psi): 4 psi Pulsed Lavage with Suction - Normal Saline Used: 1000 mL Pulsed Lavage Tip: Tip with splash shield Selective Debridement Selective Debridement - Location: Rt. Heel and surrounding callous Selective Debridement - Tools Used: Forceps Selective Debridement - Tissue Removed: slough, dry skin, callous   Physical Therapy Assessment and Plan Wound Therapy - Assess/Plan/Recommendations Wound Therapy - Clinical Statement: Wound care completed with overall improving appearance of Rt heel wound, decreased drainage without odor.  Lateral LE wound nearly healed without depth or drainage.  Returned to full lymphedema wrap for [name]  including foam, isoband and short stretch bandaging (1-6cm, 1-8cm, 3-10cm).  Pt. to get compression garment tomorrow. Wound Plan: Insure pt. receives correct compression garment from [employer] (juxtafit not juxtalite).  Continue woundcare and compression bandaging as needed.       Problem List Patient Active Problem List   Diagnosis Date Noted  . WEAKNESS 06/05/2008  . DYSPNEA ON EXERTION 11/15/2007  . CATARACT NOS 02/15/2007  . DIABETES MELLITUS, TYPE II, CONTROLLED, WITH COMPLICATIONS 08/24/2006  . RENAL FAILURE, ACUTE 07/30/2006  . HYPERLIPIDEMIA 04/26/2006  . ANEMIA-NOS 04/26/2006  . HYPERTENSION 04/26/2006  . KIDNEY DISEASE, CHRONIC, STAGE III 04/26/2006  . CEREBROVASCULAR ACCIDENT, HX OF 04/26/2006     [name], PTA/CLT 11/10/2012, 12:42 PM

## 2012-11-15 ENCOUNTER — Ambulatory Visit (HOSPITAL_COMMUNITY)
Admission: RE | Admit: 2012-11-15 | Discharge: 2012-11-15 | Disposition: A | Discharge status: Home / Self Care | Payer: Medicare Other | Source: Ambulatory Visit | Attending: Family Medicine | Admitting: Family Medicine

## 2012-11-15 NOTE — Progress Notes (Signed)
Physical Therapy - Wound Therapy Re-evaluation  Re-evaluation   Patient Details  Name: Rainier Feuerborn MRN: [id number] Date of Birth: 17-Jun-1940  Today's Date: 11/15/2012 Time: 1022-1120 Time Calculation (min): 58 min Visit#: 17 of 26  Re-eval: 11/29/12 Charges:  Deb <20cm, manual 24 minutes  Subjective Subjective Assessment Subjective: Pt states [facility] had no juxtafit in stock so had to order one.  Fitter did not remove bandages for measurement so instructed pt to insure proper fit when received.  Pain Assessment Pain Assessment Pain Assessment: No/denies pain  Wound Therapy Wound 02/11/12 Contact dermatitis Leg medial aspect  4 different wounds the largest is 7cm x .5 appears backwad L with distal width being 2.5 w no depth.  All wounds covered. (Active)     Wound 06/12/12 Other (Comment)  Right water filled blistering over lower rt leg (Active)     Wound 06/16/12 Abrasion(s) Thigh Right;Lateral Scabbed area to right lateral thigh with scabbed area (Active)     Wound 09/30/12 Other (Comment) Leg Right wound covers the anterior aspect of the pt's leg and wraps laterally towards the posterior aspect. (Active)  Site / Wound Assessment Pink 11/15/2012  3:46 PM  % Wound base Red or Granulating 100% 11/15/2012  3:46 PM  % Wound base Yellow 0% 11/15/2012  3:46 PM  % Wound base Black 0% 11/15/2012  3:46 PM  % Wound base Other (Comment) 0% 11/01/2012  5:03 PM  Peri-wound Assessment Induration 11/15/2012  3:46 PM  Wound Length (cm) 5 cm (now 1cm) 10/27/2012 11:36 AM  Wound Width (cm) 2.7 cm (now 1 cm) 10/27/2012 11:36 AM  Margins Attached edges (approximated) 11/15/2012  3:46 PM  Closure None 11/15/2012  3:46 PM  Drainage Amount Scant 11/15/2012  3:46 PM  Drainage Description Serous 11/15/2012  3:46 PM  Non-staged Wound Description Not applicable 11/15/2012  3:46 PM  Treatment Cleansed;Debridement (Selective) 11/15/2012  3:46 PM  Dressing Type Impregnated gauze (bismuth) 11/15/2012  3:46 PM   Dressing Changed Changed 11/01/2012  5:03 PM  Dressing Status Clean;Dry;Intact 11/15/2012  3:46 PM     Wound 10/05/12 Other (Comment) Heel Right stage 2 pressure ulcer (Active)  Site / Wound Assessment Pink;Yellow 11/15/2012  3:46 PM  % Wound base Red or Granulating 50% 11/15/2012  3:46 PM  % Wound base Yellow 50% 11/15/2012  3:46 PM  % Wound base Black 0% 11/03/2012 11:19 AM  % Wound base Other (Comment) 0% 11/03/2012 11:19 AM  Peri-wound Assessment Edema 11/15/2012  3:46 PM  Wound Length (cm) 2 cm  (now 1.8cm) 10/27/2012 11:36 AM  Wound Width (cm) 2.7 cm (now 2.4 cm) 10/27/2012 11:36 AM  Wound Depth (cm) 2 cm (now 1.5cm) 10/27/2012 11:36 AM  Closure None 11/15/2012  3:46 PM  Drainage Amount Moderate 11/15/2012  3:46 PM  Drainage Description Purulent;No odor 11/15/2012  3:46 PM  Non-staged Wound Description Full thickness 11/08/2012  3:37 PM  Treatment Hydrotherapy (Pulse lavage) 11/15/2012  3:46 PM  Dressing Type Silver hydrofiber 11/15/2012  3:46 PM  Dressing Changed Changed 11/15/2012  3:46 PM  Dressing Status Clean;Dry;Intact 11/15/2012  3:46 PM   Hydrotherapy Pulsed lavage therapy - wound location: Rt. Heel Pulsed Lavage with Suction (psi): 4 psi Pulsed Lavage with Suction - Normal Saline Used: 1000 mL Pulsed Lavage Tip: Tip with splash shield Selective Debridement Selective Debridement - Location: Rt. Heel and surrounding callous Selective Debridement - Tools Used: Forceps Selective Debridement - Tissue Removed: slough, dry skin, callous   Physical Therapy Assessment and Plan Wound Therapy -  Assess/Plan/Recommendations Wound Therapy - Clinical Statement: Wound care completed with overall improving appearance of Rt heel wound, decreased drainage without odor.  Lateral LE wound nearly healed without depth or drainage.  Continue with full lymphedema wrap for [name] including foam, isoband and short stretch bandaging (1-6cm, 1-8cm, 3-10cm) until juxtafit received.  Pt has met 2/5 goals and is  progressing well toward unmet goals.   Wound Therapy - Functional Problem List: Current volume today is 3,718.12cc today (previous volume at last discharge was around 3,600cc, so this is a normal volume for this patient), this is a reduction of 236.68cc in the past 3 weeks. Total volume loss since return to therapy on 09/30/12 is 2,782.63cc (total volume was 6,500.75cc).   Wound Plan: Insure pt. receives correct compression garment from [employer] (juxtafit not juxtalite).  Continue woundcare and compression bandaging as needed 2X week for 4 more weeks.      Goals Wound Therapy Goals - Improve the function of patient's integumentary system by progressing the wound(s) through the phases of wound healing by: Decrease Necrotic Tissue to: 0 Decrease Necrotic Tissue - Progress: Progressing toward goal Increase Granulation Tissue to: 100 Increase Granulation Tissue - Progress: Progressing toward goal Decrease Length/Width/Depth by (cm): 5xm 5 xm Decrease Length/Width/Depth - Progress: Progressing toward goal Improve Drainage Characteristics: Min Improve Drainage Characteristics - Progress: Progressing toward goal Patient/Family will be able to : verbalize the importance of wearing compression hose Patient/Family Instruction Goal - Progress: Met Additional Wound Therapy Goal: instructed in self lymph techniques. Lymph fluid to be decreased by 50% Additional Wound Therapy Goal - Progress: Met    [name], PTA/CLT 11/15/2012, 4:03 PM

## 2012-11-17 ENCOUNTER — Ambulatory Visit (HOSPITAL_COMMUNITY)
Admission: RE | Admit: 2012-11-17 | Discharge: 2012-11-17 | Disposition: A | Discharge status: Home / Self Care | Payer: Medicare Other | Source: Ambulatory Visit | Attending: Family Medicine | Admitting: Family Medicine

## 2012-11-17 NOTE — Progress Notes (Signed)
Physical Therapy - Wound Therapy  Treatment   Patient Details  Name: Steven [name] MRN: [id number] Date of Birth: 13-Jul-1940  Today's Date: 11/17/2012 Time: 1022-1107 Time Calculation (min): 45 min Visit#: 18 of 26  Re-eval: 11/29/12 Charges: deb<20cm  Subjective Pt reports he got a phone call from [facility] today and his juxtafit is in and gets it tomorrow.   Wound Therapy Wound 02/11/12 Contact dermatitis Leg medial aspect  4 different wounds the largest is 7cm x .5 appears backwad L with distal width being 2.5 w no depth.  All wounds covered. (Active)     Wound 06/12/12 Other (Comment)  Right water filled blistering over lower rt leg (Active)     Wound 06/16/12 Abrasion(s) Thigh Right;Lateral Scabbed area to right lateral thigh with scabbed area (Active)     Wound 09/30/12 Other (Comment) Leg Right wound covers the anterior aspect of the pt's leg and wraps laterally towards the posterior aspect. (Active)  Site / Wound Assessment Pink 11/17/2012 11:36 AM  % Wound base Red or Granulating 100% 11/17/2012 11:36 AM  % Wound base Yellow 0% 11/17/2012 11:36 AM  % Wound base Black 0% 11/17/2012 11:36 AM  % Wound base Other (Comment) 0% 11/01/2012  5:03 PM  Peri-wound Assessment Induration 11/17/2012 11:36 AM  Wound Length (cm) 5 cm 10/27/2012 11:36 AM  Wound Width (cm) 2.7 cm 10/27/2012 11:36 AM  Margins Attached edges (approximated) 11/17/2012 11:36 AM  Closure None 11/17/2012 11:36 AM  Drainage Amount Scant 11/17/2012 11:36 AM  Drainage Description Serous 11/17/2012 11:36 AM  Non-staged Wound Description Not applicable 11/17/2012 11:36 AM  Treatment Cleansed;Debridement (Selective) 11/17/2012 11:36 AM  Dressing Type Impregnated gauze (bismuth) 11/17/2012 11:36 AM  Dressing Changed Changed 11/01/2012  5:03 PM  Dressing Status Clean;Dry;Intact 11/17/2012 11:36 AM     Wound 10/05/12 Other (Comment) Heel Right stage 2 pressure ulcer (Active)  Site / Wound Assessment Pink;Yellow 11/17/2012 11:36 AM   % Wound base Red or Granulating 60% 11/17/2012 11:36 AM  % Wound base Yellow 40% 11/17/2012 11:36 AM  % Wound base Black 0% 11/03/2012 11:19 AM  % Wound base Other (Comment) 0% 11/03/2012 11:19 AM  Peri-wound Assessment Edema 11/17/2012 11:36 AM  Wound Length (cm) 1.5 cm 11/17/2012 11:36 AM  Wound Width (cm) 2.5 cm 11/17/2012 11:36 AM  Wound Depth (cm) 1.5 cm 11/17/2012 11:36 AM  Closure None 11/17/2012 11:36 AM  Drainage Amount Moderate 11/17/2012 11:36 AM  Drainage Description Purulent;No odor 11/17/2012 11:36 AM  Non-staged Wound Description Full thickness 11/08/2012  3:37 PM  Treatment Hydrotherapy (Pulse lavage) 11/17/2012 11:36 AM  Dressing Type Silver hydrofiber 11/17/2012 11:36 AM  Dressing Changed Changed 11/17/2012 11:36 AM  Dressing Status Clean;Dry;Intact 11/17/2012 11:36 AM   Hydrotherapy Pulsed lavage therapy - wound location: Rt. Heel Pulsed Lavage with Suction (psi): 4 psi Pulsed Lavage with Suction - Normal Saline Used: 1000 mL Pulsed Lavage Tip: Tip with splash shield Selective Debridement Selective Debridement - Location: Rt. Heel and surrounding callous Selective Debridement - Tools Used: Forceps Selective Debridement - Tissue Removed: slough, dry skin, callous   Physical Therapy Assessment and Plan Wound Therapy - Assess/Plan/Recommendations Wound Therapy - Clinical Statement: Wound care completed with overall improving appearance of Rt heel wound, decreased drainage without odor.  Lateral LE wound nearly healed without depth or drainage.  Discontinued MLD and full lymphedema wrap for [name].  Pt. to get compression garment tomorrow. Wound Plan: Insure pt. receives correct compression garment from [employer] (juxtafit not juxtalite).  Continue woundcare  and compression bandaging as needed.      Problem List Patient Active Problem List   Diagnosis Date Noted  . WEAKNESS 06/05/2008  . DYSPNEA ON EXERTION 11/15/2007  . CATARACT NOS 02/15/2007  . DIABETES MELLITUS, TYPE  II, CONTROLLED, WITH COMPLICATIONS 08/24/2006  . RENAL FAILURE, ACUTE 07/30/2006  . HYPERLIPIDEMIA 04/26/2006  . ANEMIA-NOS 04/26/2006  . HYPERTENSION 04/26/2006  . KIDNEY DISEASE, CHRONIC, STAGE III 04/26/2006  . CEREBROVASCULAR ACCIDENT, HX OF 04/26/2006     [name], PTA/CLT 11/17/2012, 11:44 AM

## 2012-11-22 ENCOUNTER — Ambulatory Visit (HOSPITAL_COMMUNITY)
Admission: RE | Admit: 2012-11-22 | Discharge: 2012-11-22 | Disposition: A | Discharge status: Home / Self Care | Payer: Medicare Other | Source: Ambulatory Visit | Attending: Family Medicine | Admitting: Family Medicine

## 2012-11-22 NOTE — Progress Notes (Signed)
Physical Therapy - Wound Therapy  Treatment   Patient Details  Name: Steven [name] MRN: [id number] Date of Birth: 24-Jul-1940  Today's Date: 11/22/2012 Time: 4098-1191 Time Calculation (min): 30 min Charges: Selective debridement (= or < 20 cm)   Visit#: 19 of 26  Re-eval: 11/29/12  Subjective Subjective Assessment Subjective: Pt states that he has on juxtafit compression but it is too big. He has ordered a smaller size and he is waiting for it to come in.  Pain Assessment Pain Assessment Pain Assessment: No/denies pain  Wound Therapy Wound 02/11/12 Contact dermatitis Leg medial aspect  4 different wounds the largest is 7cm x .5 appears backwad L with distal width being 2.5 w no depth.  All wounds covered. (Active)     Wound 06/12/12 Other (Comment)  Right water filled blistering over lower rt leg (Active)     Wound 06/16/12 Abrasion(s) Thigh Right;Lateral Scabbed area to right lateral thigh with scabbed area (Active)     Wound 09/30/12 Other (Comment) Leg Right wound covers the anterior aspect of the pt's leg and wraps laterally towards the posterior aspect. (Active)  Site / Wound Assessment Pink 11/22/2012 12:52 PM  % Wound base Red or Granulating 100% 11/22/2012 12:52 PM  % Wound base Yellow 0% 11/22/2012 12:52 PM  % Wound base Black 0% 11/22/2012 12:52 PM  % Wound base Other (Comment) 0% 11/01/2012  5:03 PM  Peri-wound Assessment Induration 11/17/2012 11:36 AM  Wound Length (cm) 5 cm 10/27/2012 11:36 AM  Wound Width (cm) 2.7 cm 10/27/2012 11:36 AM  Margins Attached edges (approximated) 11/22/2012 12:52 PM  Closure None 11/22/2012 12:52 PM  Drainage Amount None 11/22/2012 12:52 PM  Drainage Description Serous 11/17/2012 11:36 AM  Non-staged Wound Description Not applicable 11/22/2012 12:52 PM  Treatment Cleansed;Debridement (Selective) 11/17/2012 11:36 AM  Dressing Type Impregnated gauze (bismuth) 11/17/2012 11:36 AM  Dressing Changed Changed 11/01/2012  5:03 PM  Dressing Status  Clean;Dry;Intact 11/17/2012 11:36 AM     Wound 10/05/12 Other (Comment) Heel Right stage 2 pressure ulcer (Active)  Site / Wound Assessment Pink;Yellow 11/22/2012 12:52 PM  % Wound base Red or Granulating 60% 11/22/2012 12:52 PM  % Wound base Yellow 40% 11/22/2012 12:52 PM  % Wound base Black 0% 11/03/2012 11:19 AM  % Wound base Other (Comment) 0% 11/03/2012 11:19 AM  Peri-wound Assessment Edema 11/22/2012 12:52 PM  Wound Length (cm) 1.5 cm 11/17/2012 11:36 AM  Wound Width (cm) 2.5 cm 11/17/2012 11:36 AM  Wound Depth (cm) 1.5 cm 11/17/2012 11:36 AM  Closure None 11/22/2012 12:52 PM  Drainage Amount Moderate 11/22/2012 12:52 PM  Drainage Description Purulent;No odor 11/22/2012 12:52 PM  Non-staged Wound Description Full thickness 11/08/2012  3:37 PM  Treatment Hydrotherapy (Pulse lavage) 11/17/2012 11:36 AM  Dressing Type Silver hydrofiber 11/22/2012 12:52 PM  Dressing Changed Changed 11/17/2012 11:36 AM  Dressing Status Clean;Dry;Intact 11/22/2012 12:52 PM   Hydrotherapy Pulsed lavage therapy - wound location: Rt. Heel Pulsed Lavage with Suction (psi): 4 psi Pulsed Lavage with Suction - Normal Saline Used: 1000 mL Pulsed Lavage Tip: Tip with splash shield Selective Debridement Selective Debridement - Location: Rt. Heel and surrounding callous Selective Debridement - Tools Used: Forceps Selective Debridement - Tissue Removed: slough, dry skin, callous   Physical Therapy Assessment and Plan Wound Therapy - Assess/Plan/Recommendations Wound Therapy - Clinical Statement: Pt tolerates debridement well. Right heel wound presents with mild odor which decreases after pulse lavage. Wound debrided and dressed with silver hydrofiber. Lateral LE wound is completely healed. Juxtalite applied  after dressing as juxtafit is too big and cause pockets of edema throughout LLE. Pt is waiting for smaller juxtafit to come in. Wound Plan: Continue woundcare and compression bandaging as needed.  Problem List Patient Active  Problem List   Diagnosis Date Noted  . WEAKNESS 06/05/2008  . DYSPNEA ON EXERTION 11/15/2007  . CATARACT NOS 02/15/2007  . DIABETES MELLITUS, TYPE II, CONTROLLED, WITH COMPLICATIONS 08/24/2006  . RENAL FAILURE, ACUTE 07/30/2006  . HYPERLIPIDEMIA 04/26/2006  . ANEMIA-NOS 04/26/2006  . HYPERTENSION 04/26/2006  . KIDNEY DISEASE, CHRONIC, STAGE III 04/26/2006  . CEREBROVASCULAR ACCIDENT, HX OF 04/26/2006    GP Functional Assessment Tool Used: clinical judgement Functional Limitation: Other PT primary Other PT Primary Current Status (W0981): At least 40 percent but less than 60 percent impaired, limited or restricted Other PT Primary Goal Status (X9147): At least 20 percent but less than 40 percent impaired, limited or restricted  Seth Bake, PTA  11/22/2012, 1:00 PM

## 2012-11-24 ENCOUNTER — Ambulatory Visit (HOSPITAL_COMMUNITY)
Admission: RE | Admit: 2012-11-24 | Discharge: 2012-11-24 | Disposition: A | Discharge status: Home / Self Care | Payer: Medicare Other | Source: Ambulatory Visit | Attending: Family Medicine | Admitting: Family Medicine

## 2012-11-24 NOTE — Progress Notes (Signed)
Physical Therapy - Wound Therapy  Treatment   Patient Details  Name: Steven [name] MRN: [id number] Date of Birth: [birth year]  Today's Date: 11/24/2012 Time: 4098-1191 Time Calculation (min): 37 min  Visit#: 20 of 26  Re-eval: 11/29/12  Subjective Subjective Assessment Subjective: No c/o today.    Pain Assessment Pain Assessment Pain Assessment: No/denies pain  Wound Therapy  11/24/12 1457  Subjective Assessment  Subjective No c/o today.    Wound  Date First Assessed/Time First Assessed: 10/05/12 0945   Wound Type: Other (Comment)  Location: Heel  Location Orientation: Right  Wound Description (Comments): stage 2 pressure ulcer  Site / Wound Assessment Pink;Yellow  % Wound base Red or Granulating 60%  % Wound base Yellow 40%  Peri-wound Assessment Edema  Closure None  Drainage Amount Moderate  Drainage Description Purulent;No odor  Treatment Cleansed;Debridement (Selective);Hydrotherapy (Pulse lavage)  Dressing Type Silver hydrofiber;ABD;Tape dressing  Dressing Changed Changed  Dressing Status Clean;Dry;Intact  Hydrotherapy  Pulsed lavage therapy - wound location Rt. Heel  Pulsed Lavage with Suction (psi) 4 psi  Pulsed Lavage with Suction - Normal Saline Used 1000 mL  Pulsed Lavage Tip Tip with splash shield  Selective Debridement  Selective Debridement - Location Rt. Heel and surrounding callous  Selective Debridement - Tools Used Forceps  Selective Debridement - Tissue Removed slough, dry skin, callous  Wound Therapy - Assess/Plan/Recommendations  Wound Therapy - Clinical Statement Pt tolerated well towards total treatment with no c/o pain.  Continued debridement with removal of yellow slough, dry skin and callouses to promote granulation.  Silver hydrofiber and ABD pad for drainage.  Wound Plan Continue woundcare and compression bandaging as needed.    Hydrotherapy Pulsed lavage therapy - wound location: Rt. Heel Pulsed Lavage with Suction (psi): 4  psi Pulsed Lavage with Suction - Normal Saline Used: 1000 mL Pulsed Lavage Tip: Tip with splash shield Selective Debridement Selective Debridement - Location: Rt. Heel and surrounding callous Selective Debridement - Tools Used: Forceps Selective Debridement - Tissue Removed: slough, dry skin, callous   Physical Therapy Assessment and Plan Wound Therapy - Assess/Plan/Recommendations Wound Therapy - Clinical Statement: Pt tolerated well towards total treatment with no c/o pain.  Continued debridement with removal of yellow slough, dry skin and callouses to promote granulation.  Silver hydrofiber and ABD pad for drainage. Wound Plan: Continue woundcare and compression bandaging as needed.      Goals    Problem List Patient Active Problem List   Diagnosis Date Noted  . WEAKNESS 06/05/2008  . DYSPNEA ON EXERTION 11/15/2007  . CATARACT NOS 02/15/2007  . DIABETES MELLITUS, TYPE II, CONTROLLED, WITH COMPLICATIONS 08/24/2006  . RENAL FAILURE, ACUTE 07/30/2006  . HYPERLIPIDEMIA 04/26/2006  . ANEMIA-NOS 04/26/2006  . HYPERTENSION 04/26/2006  . KIDNEY DISEASE, CHRONIC, STAGE III 04/26/2006  . CEREBROVASCULAR ACCIDENT, HX OF 04/26/2006    GP Functional Assessment Tool Used: clinical judgement Functional Limitation: Other PT primary Other PT Primary Current Status (Y7829): At least 40 percent but less than 60 percent impaired, limited or restricted Other PT Primary Goal Status (F6213): At least 20 percent but less than 40 percent impaired, limited or restricted  Juel Burrow 11/24/2012, 3:13 PM

## 2012-11-29 ENCOUNTER — Ambulatory Visit (HOSPITAL_COMMUNITY)
Admission: RE | Admit: 2012-11-29 | Discharge: 2012-11-29 | Disposition: A | Discharge status: Home / Self Care | Payer: Medicare Other | Source: Ambulatory Visit | Attending: Family Medicine | Admitting: Family Medicine

## 2012-11-29 DIAGNOSIS — IMO0001 Reserved for inherently not codable concepts without codable children: Secondary | ICD-10-CM | POA: Insufficient documentation

## 2012-11-29 DIAGNOSIS — S81009A Unspecified open wound, unspecified knee, initial encounter: Secondary | ICD-10-CM | POA: Insufficient documentation

## 2012-11-29 DIAGNOSIS — M7989 Other specified soft tissue disorders: Secondary | ICD-10-CM | POA: Insufficient documentation

## 2012-11-29 NOTE — Progress Notes (Signed)
Physical Therapy - Wound Therapy  Treatment   Patient Details  Name: Steven [name] MRN: [id number] Date of Birth: 1941/05/31  Today's Date: 11/29/2012 Time: 1478-2956 Time Calculation (min): 37 min Charges: Selective debridement (= or < 20 cm); Diverter tip   Visit#: 21 of 26  Re-eval: 11/29/12  Subjective Subjective Assessment Subjective: Pt has not yet received new justafit.  Pain Assessment Pain Assessment Pain Assessment: No/denies pain  Wound Therapy Wound 02/11/12 Contact dermatitis Leg medial aspect  4 different wounds the largest is 7cm x .5 appears backwad L with distal width being 2.5 w no depth.  All wounds covered. (Active)     Wound 06/12/12 Other (Comment)  Right water filled blistering over lower rt leg (Active)     Wound 06/16/12 Abrasion(s) Thigh Right;Lateral Scabbed area to right lateral thigh with scabbed area (Active)     Wound 09/30/12 Other (Comment) Leg Right wound covers the anterior aspect of the pt's leg and wraps laterally towards the posterior aspect. (Active)  Site / Wound Assessment Pink 11/22/2012 12:52 PM  % Wound base Red or Granulating 100% 11/22/2012 12:52 PM  % Wound base Yellow 0% 11/22/2012 12:52 PM  % Wound base Black 0% 11/22/2012 12:52 PM  % Wound base Other (Comment) 0% 11/01/2012  5:03 PM  Peri-wound Assessment Induration 11/17/2012 11:36 AM  Wound Length (cm) 5 cm 10/27/2012 11:36 AM  Wound Width (cm) 2.7 cm 10/27/2012 11:36 AM  Margins Attached edges (approximated) 11/22/2012 12:52 PM  Closure None 11/22/2012 12:52 PM  Drainage Amount None 11/22/2012 12:52 PM  Drainage Description Serous 11/17/2012 11:36 AM  Non-staged Wound Description Not applicable 11/22/2012 12:52 PM  Treatment Cleansed;Debridement (Selective) 11/17/2012 11:36 AM  Dressing Type Impregnated gauze (bismuth) 11/17/2012 11:36 AM  Dressing Changed Changed 11/01/2012  5:03 PM  Dressing Status Clean;Dry;Intact 11/17/2012 11:36 AM     Wound 10/05/12 Other (Comment) Heel Right  stage 2 pressure ulcer (Active)  Site / Wound Assessment Pink;Yellow 11/29/2012  1:59 PM  % Wound base Red or Granulating 65% 11/29/2012  1:59 PM  % Wound base Yellow 45% 11/29/2012  1:59 PM  % Wound base Black 0% 11/03/2012 11:19 AM  % Wound base Other (Comment) 0% 11/03/2012 11:19 AM  Peri-wound Assessment Edema 11/29/2012  1:59 PM  Wound Length (cm) 1.5 cm 11/17/2012 11:36 AM  Wound Width (cm) 2.5 cm 11/17/2012 11:36 AM  Wound Depth (cm) 1.5 cm 11/17/2012 11:36 AM  Closure None 11/29/2012  1:59 PM  Drainage Amount Moderate 11/29/2012  1:59 PM  Drainage Description Purulent;No odor 11/29/2012  1:59 PM  Non-staged Wound Description Full thickness 11/08/2012  3:37 PM  Treatment Cleansed;Debridement (Selective) 11/29/2012  1:59 PM  Dressing Type Silver hydrofiber;ABD;Tape dressing;Compression wrap 11/29/2012  1:59 PM  Dressing Changed New 11/29/2012  1:59 PM  Dressing Status Clean;Dry;Intact 11/29/2012  1:59 PM   Hydrotherapy Pulsed lavage therapy - wound location: Rt. Heel Pulsed Lavage with Suction (psi): 4 psi Pulsed Lavage with Suction - Normal Saline Used: 1000 mL Pulsed Lavage Tip: Tip with splash shield Selective Debridement Selective Debridement - Location: Rt. Heel and surrounding callous Selective Debridement - Tools Used: Forceps Selective Debridement - Tissue Removed: slough, dry skin, callous   Physical Therapy Assessment and Plan Wound Therapy - Assess/Plan/Recommendations Wound Therapy - Clinical Statement: Pt tolerates debridement well. Wound is without signs or sx of infection. Granulation has improved. Continued with silver hydrofiber dressing ad wound has responded well to it. Wound Plan: Continue woundcare and compression bandaging as needed.  Problem  List Patient Active Problem List   Diagnosis Date Noted  . WEAKNESS 06/05/2008  . DYSPNEA ON EXERTION 11/15/2007  . CATARACT NOS 02/15/2007  . DIABETES MELLITUS, TYPE II, CONTROLLED, WITH COMPLICATIONS 08/24/2006  . RENAL FAILURE, ACUTE  07/30/2006  . HYPERLIPIDEMIA 04/26/2006  . ANEMIA-NOS 04/26/2006  . HYPERTENSION 04/26/2006  . KIDNEY DISEASE, CHRONIC, STAGE III 04/26/2006  . CEREBROVASCULAR ACCIDENT, HX OF 04/26/2006    Seth Bake, PTA  11/29/2012, 2:34 PM

## 2012-12-01 ENCOUNTER — Ambulatory Visit (HOSPITAL_COMMUNITY)
Admission: RE | Admit: 2012-12-01 | Discharge: 2012-12-01 | Disposition: A | Discharge status: Home / Self Care | Payer: Medicare Other | Source: Ambulatory Visit | Attending: Family Medicine | Admitting: Family Medicine

## 2012-12-01 NOTE — Progress Notes (Signed)
Physical Therapy - Wound Therapy  Treatment   Patient Details  Name: Steven [name] MRN: [id number] Date of Birth: [birth year]  Today's Date: 12/01/2012 Time: 1108-1200 Time Calculation (min): 52 min Visit#: 22 of 26  Re-eval: 11/29/12 Charges:  Deb<20cm  Subjective Subjective Assessment Subjective: Pt still has not received new Juxtafit garment.  Noted with increased swelling in foot and ankle due to lack of gradient compression.  Pain Assessment Pain Assessment Pain Assessment: No/denies pain  Wound Therapy                                                                                     Wound 10/05/12 Other (Comment) Heel Right stage 2 pressure ulcer (Active)  Site / Wound Assessment Pink;Yellow 12/01/2012  6:33 PM  % Wound base Red or Granulating 70% 12/01/2012  6:33 PM  % Wound base Yellow 30% 12/01/2012  6:33 PM  % Wound base Black 0% 11/03/2012 11:19 AM  % Wound base Other (Comment) 0% 11/03/2012 11:19 AM  Peri-wound Assessment Edema 12/01/2012  6:33 PM  Wound Length (cm) 1.5 cm 12/01/2012  6:33 PM  Wound Width (cm) 2.5 cm 12/01/2012  6:33 PM  Wound Depth (cm) 0.8 cm 12/01/2012  6:33 PM  Closure None 12/01/2012  6:33 PM  Drainage Amount Moderate 12/01/2012  6:33 PM  Drainage Description Serosanguineous;No odor 12/01/2012  6:33 PM  Non-staged Wound Description Full thickness 11/08/2012  3:37 PM  Treatment Hydrotherapy (Pulse lavage);Debridement (Selective) 12/01/2012  6:33 PM  Dressing Type Silver hydrofiber;ABD;Tape dressing;Compression wrap 12/01/2012  6:33 PM  Dressing Changed Changed 12/01/2012  6:33 PM  Dressing Status Clean;Dry;Intact 12/01/2012  6:33 PM   Hydrotherapy Pulsed lavage therapy - wound location: Rt. Heel Pulsed Lavage with Suction (psi): 4 psi Pulsed Lavage with Suction - Normal Saline Used: 1000 mL Pulsed Lavage Tip: Tip with splash shield Selective Debridement Selective Debridement - Location: Rt. Heel and surrounding callous Selective Debridement -  Tools Used: Forceps;Scissors Selective Debridement - Tissue Removed: slough, dry skin, callous   Physical Therapy Assessment and Plan Wound Therapy - Assess/Plan/Recommendations Wound Therapy - Clinical Statement: Pt tolerates debridement well. Wound is without signs or sx of infection. Granulation has improved with decreasing depth. Continued with silver hydrofiber dressing ad wound has responded well to it. Wound Plan: Continue woundcare and compression bandaging as needed.       Problem List Patient Active Problem List   Diagnosis Date Noted  . WEAKNESS 06/05/2008  . DYSPNEA ON EXERTION 11/15/2007  . CATARACT NOS 02/15/2007  . DIABETES MELLITUS, TYPE II, CONTROLLED, WITH COMPLICATIONS 08/24/2006  . RENAL FAILURE, ACUTE 07/30/2006  . HYPERLIPIDEMIA 04/26/2006  . ANEMIA-NOS 04/26/2006  . HYPERTENSION 04/26/2006  . KIDNEY DISEASE, CHRONIC, STAGE III 04/26/2006  . CEREBROVASCULAR ACCIDENT, HX OF 04/26/2006     [name], PTA/CLT 12/01/2012, 6:42 PM

## 2012-12-06 ENCOUNTER — Ambulatory Visit (HOSPITAL_COMMUNITY)
Admission: RE | Admit: 2012-12-06 | Discharge: 2012-12-06 | Disposition: A | Discharge status: Home / Self Care | Payer: Medicare Other | Source: Ambulatory Visit | Attending: Family Medicine | Admitting: Family Medicine

## 2012-12-06 NOTE — Progress Notes (Signed)
Physical Therapy - Wound Therapy  Treatment   Patient Details  Name: Steven [name] MRN: [id number] Date of Birth: Feb 09, 1941  Today's Date: 12/06/2012 Time: 4098-1191 Time Calculation (min): 55 min  Visit#: 23 of 26  Re-eval: 11/29/12 Charges:  Deb <20cm  Subjective Subjective Assessment Subjective: Pt still has not received juxtafit garment; brought juxtafit that was given to him (too large) to use the ankle piece.  No pain.  Pain Assessment Pain Assessment Pain Assessment: No/denies pain  Wound Therapy Wound 02/11/12 Contact dermatitis Leg medial aspect  4 different wounds the largest is 7cm x .5 appears backwad L with distal width being 2.5 w no depth.  All wounds covered. (Active)     Wound 06/12/12 Other (Comment)  Right water filled blistering over lower rt leg (Active)     Wound 06/16/12 Abrasion(s) Thigh Right;Lateral Scabbed area to right lateral thigh with scabbed area (Active)     Wound 10/05/12 Other (Comment) Heel Right stage 2 pressure ulcer (Active)  Site / Wound Assessment Pink;Yellow 12/06/2012 12:06 PM  % Wound base Red or Granulating 75% 12/06/2012 12:06 PM  % Wound base Yellow 25% 12/06/2012 12:06 PM  % Wound base Black 0% 11/03/2012 11:19 AM  % Wound base Other (Comment) 0% 11/03/2012 11:19 AM  Peri-wound Assessment Edema 12/06/2012 12:06 PM  Wound Length (cm) 1.5 cm 12/01/2012  6:33 PM  Wound Width (cm) 2.5 cm 12/01/2012  6:33 PM  Wound Depth (cm) 0.8 cm 12/01/2012  6:33 PM  Closure None 12/06/2012 12:06 PM  Drainage Amount Minimal 12/06/2012 12:06 PM  Drainage Description Serosanguineous;No odor 12/06/2012 12:06 PM  Non-staged Wound Description Full thickness 11/08/2012  3:37 PM  Treatment Hydrotherapy (Pulse lavage) 12/06/2012 12:06 PM  Dressing Type Silver hydrofiber;ABD;Tape dressing;Compression wrap 12/06/2012 12:06 PM  Dressing Changed Changed 12/06/2012 12:06 PM  Dressing Status Clean;Dry;Intact 12/06/2012 12:06 PM   Hydrotherapy Pulsed lavage therapy - wound location:  Rt. Heel Pulsed Lavage with Suction (psi): 4 psi Pulsed Lavage with Suction - Normal Saline Used: 1000 mL Pulsed Lavage Tip: Tip with splash shield Selective Debridement Selective Debridement - Location: Rt. Heel and surrounding callous Selective Debridement - Tools Used: Forceps;Scissors Selective Debridement - Tissue Removed: slough, dry skin, callous   Physical Therapy Assessment and Plan Wound Therapy - Assess/Plan/Recommendations Wound Therapy - Clinical Statement: Pt tolerates debridement well. Wound is without signs or sx of infection. Granulation continues to  improve with decreasing depth. Continued with silver hydrofiber dressing.  No odor present in drainage/wound bed.  Debrided edges to promote approximation.  Removed scaly skin from perimeter and LE to improve skin integrity. Wound Plan: Continue woundcare and compression bandaging as needed.       Problem List Patient Active Problem List   Diagnosis Date Noted  . WEAKNESS 06/05/2008  . DYSPNEA ON EXERTION 11/15/2007  . CATARACT NOS 02/15/2007  . DIABETES MELLITUS, TYPE II, CONTROLLED, WITH COMPLICATIONS 08/24/2006  . RENAL FAILURE, ACUTE 07/30/2006  . HYPERLIPIDEMIA 04/26/2006  . ANEMIA-NOS 04/26/2006  . HYPERTENSION 04/26/2006  . KIDNEY DISEASE, CHRONIC, STAGE III 04/26/2006  . CEREBROVASCULAR ACCIDENT, HX OF 04/26/2006     [name], PTA/CLT 12/06/2012, 12:11 PM

## 2012-12-08 ENCOUNTER — Ambulatory Visit (HOSPITAL_COMMUNITY)
Admission: RE | Admit: 2012-12-08 | Discharge: 2012-12-08 | Disposition: A | Discharge status: Home / Self Care | Payer: Medicare Other | Source: Ambulatory Visit | Attending: Family Medicine | Admitting: Family Medicine

## 2012-12-08 NOTE — Progress Notes (Signed)
Physical Therapy - Wound Therapy  Evaluation   Patient Details  Name: Steven [name] MRN: [id number] Date of Birth: 1940-07-17  Today's Date: 12/08/2012 Time: 1015-1102 Time Calculation (min): 47 min  Visit#: 24 of 36  Re-eval: 11/29/12  Subjective Subjective Assessment Subjective: Pt states that he is using his compression garments everyday Patient and Family Stated Goals: decreased pain, wound to heal and to control the swelling of his leg. Date of Onset: 09/21/12 Prior Treatments: self dressing  Pain Assessment Pain Assessment Pain Assessment: No/denies pain  Wound Therapy Wound 02/11/12 Contact dermatitis Leg medial aspect  4 different wounds the largest is 7cm x .5 appears backwad L with distal width being 2.5 w no depth.  All wounds covered. (Active)     Wound 06/12/12 Other (Comment)  Right water filled blistering over lower rt leg (Active)     Wound 06/16/12 Abrasion(s) Thigh Right;Lateral Scabbed area to right lateral thigh with scabbed area (Active)     Wound 10/05/12 Other (Comment) Heel Right stage 2 pressure ulcer (Active)  Site / Wound Assessment Red;Yellow 12/08/2012  1:56 PM  % Wound base Red or Granulating 80% 12/08/2012  1:56 PM  % Wound base Yellow 20% 12/08/2012  1:56 PM  % Wound base Black 0% 12/08/2012  1:56 PM  % Wound base Other (Comment) 0% 12/08/2012  1:56 PM  Peri-wound Assessment Edema 12/08/2012  1:56 PM  Wound Length (cm) 1 cm was 2.2 12/08/2012  1:56 PM  Wound Width (cm) 2.2 cm was 2.7 12/08/2012  1:56 PM  Wound Depth (cm) 1.2 cm was 2.0 12/08/2012  1:56 PM  Undermining (cm) undermining from 4 0'clock to 10 o'clock approximately 1 cm  12/08/2012  1:56 PM  Closure None 12/06/2012 12:06 PM  Drainage Amount Minimal was moderate 12/08/2012  1:56 PM  Drainage Description No odor was odorous 12/08/2012  2:27 PM  Non-staged Wound Description Full thickness 12/08/2012  1:56 PM  Treatment Cleansed;Debridement (Selective);Hydrotherapy (Pulse lavage) 12/08/2012   1:56 PM  Dressing Type Silver hydrofiber;ABD;Tape dressing;Compression wrap 12/08/2012  1:56 PM  Dressing Changed Changed 12/06/2012 12:06 PM  Dressing Status Clean;Dry;Intact 12/08/2012  1:56 PM  (was is from last re-evaluation) Hydrotherapy Pulsed lavage therapy - wound location: Rt heel all of wound Pulsed Lavage with Suction (psi): 4 psi Pulsed Lavage with Suction - Normal Saline Used: 1000 mL Selective Debridement Selective Debridement - Location: slough wound bed of R heel.  Also debrided skin that had Epiboled   Physical Therapy Assessment and Plan Wound Therapy - Assess/Plan/Recommendations Wound Therapy - Frequency: 3X / week Wound Therapy - Current Recommendations: PT Wound Plan:  Pt to be seen three times a week for four weeks or until wound as healed as pt has diabetes, immobiility issues, vascular compromised and L BKA.      Goals Wound Therapy Goals - Improve the function of patient's integumentary system by progressing the wound(s) through the phases of wound healing by: Decrease Necrotic Tissue to: 0 Decrease Necrotic Tissue - Progress: Progressing toward goal Increase Granulation Tissue to: 100 Increase Granulation Tissue - Progress: Progressing toward goal Decrease Length/Width/Depth by (cm): heel wound healed Decrease Length/Width/Depth - Progress: Updated due to goal met Improve Drainage Characteristics: Min Improve Drainage Characteristics - Progress: Met Patient/Family will be able to : verbalize the importance of wearing compression hose Patient/Family Instruction Goal - Progress: Met Additional Wound Therapy Goal: instructed in self lymph techniques. Lymph fluid to be decreased by 50% Additional Wound Therapy Goal - Progress: Met Goals/treatment plan/discharge plan  were made with and agreed upon by patient/family: Yes Time For Goal Achievement:  (4 weeks) Wound Therapy - Potential for Goals: Good  Problem List Patient Active Problem List   Diagnosis Date  Noted  . WEAKNESS 06/05/2008  . DYSPNEA ON EXERTION 11/15/2007  . CATARACT NOS 02/15/2007  . DIABETES MELLITUS, TYPE II, CONTROLLED, WITH COMPLICATIONS 08/24/2006  . RENAL FAILURE, ACUTE 07/30/2006  . HYPERLIPIDEMIA 04/26/2006  . ANEMIA-NOS 04/26/2006  . HYPERTENSION 04/26/2006  . KIDNEY DISEASE, CHRONIC, STAGE III 04/26/2006  . CEREBROVASCULAR ACCIDENT, HX OF 04/26/2006    GP Functional Assessment Tool Used: clinical judgement Functional Limitation: Other PT primary Other PT Primary Current Status (Z6109): At least 20 percent but less than 40 percent impaired, limited or restricted Other PT Primary Goal Status (U0454): At least 1 percent but less than 20 percent impaired, limited or restricted  Jaqualyn Juday,CINDY 12/08/2012, 2:33 PM   Your signature is required to indicate approval of the treatment plan as stated above.  Please sign and return making a copy for your files.  You may hard copy or send electronically.  Please check one: ___1.  Approve of this plan  ___2.  Approve of this plan with the following changes.   ____________________________                             _____________ Physician                                                                      Date

## 2012-12-13 ENCOUNTER — Ambulatory Visit (HOSPITAL_COMMUNITY)
Admission: RE | Admit: 2012-12-13 | Discharge: 2012-12-13 | Disposition: A | Discharge status: Home / Self Care | Payer: Medicare Other | Source: Ambulatory Visit | Attending: Family Medicine | Admitting: Family Medicine

## 2012-12-13 NOTE — Progress Notes (Signed)
Physical Therapy - Wound Therapy  Treatment   Patient Details  Name: Steven [name] MRN: [id number] Date of Birth: Aug 09, 1940  Today's Date: 12/13/2012 Time: 5956-3875 Time Calculation (min): 28 min Charges:  Deb<20cm Visit#: 25 of 36  Re-eval: 11/29/12  Subjective Subjective Assessment Subjective: Pt states he still has not gotten his compression garment.  Reports no pain or difficulties today.   Wound Therapy Wound 02/11/12 Contact dermatitis Leg medial aspect  4 different wounds the largest is 7cm x .5 appears backwad L with distal width being 2.5 w no depth.  All wounds covered. (Active)     Wound 06/12/12 Other (Comment)  Right water filled blistering over lower rt leg (Active)     Wound 06/16/12 Abrasion(s) Thigh Right;Lateral Scabbed area to right lateral thigh with scabbed area (Active)     Wound 10/05/12 Other (Comment) Heel Right stage 2 pressure ulcer (Active)  Site / Wound Assessment Red;Yellow 12/13/2012 11:00 AM  % Wound base Red or Granulating 80% 12/13/2012 11:00 AM  % Wound base Yellow 20% 12/13/2012 11:00 AM  % Wound base Black 0% 12/13/2012 11:00 AM  % Wound base Other (Comment) 0% 12/13/2012 11:00 AM  Peri-wound Assessment Edema 12/13/2012 11:00 AM  Wound Length (cm) 1 cm 12/08/2012  1:56 PM  Wound Width (cm) 2.2 cm 12/08/2012  1:56 PM  Wound Depth (cm) 1.2 cm 12/08/2012  1:56 PM  Undermining (cm) undermining from 4 0'clock to 10 o'clock approximately 1 cm  12/08/2012  1:56 PM  Closure None 12/06/2012 12:06 PM  Drainage Amount Minimal 12/13/2012 11:00 AM  Drainage Description No odor;Serosanguineous 12/13/2012 11:00 AM  Non-staged Wound Description Full thickness 12/13/2012 11:00 AM  Treatment Hydrotherapy (Pulse lavage) 12/13/2012 11:00 AM  Dressing Type Silver hydrofiber;ABD;Tape dressing;Compression wrap 12/13/2012 11:00 AM  Dressing Changed Changed 12/13/2012 11:00 AM  Dressing Status Clean;Dry;Intact 12/13/2012 11:00 AM   Hydrotherapy Pulsed lavage therapy - wound  location: Rt heel all of wound Pulsed Lavage with Suction (psi): 4 psi Pulsed Lavage with Suction - Normal Saline Used: 1000 mL Pulsed Lavage Tip: Tip with splash shield Selective Debridement Selective Debridement - Location: slough wound bed of R heel.  Also debrided skin that had Epiboled Selective Debridement - Tools Used: Forceps;Scissors Selective Debridement - Tissue Removed: slough, dry skin, callous   Physical Therapy Assessment and Plan Assessment:  Rt heel wound continues to heal with decreasing depth and improving integrity of surrounding skin.  Pt. Remains compliant with compression garments for [name].  Pt expressed interest in continuing PT for gait after heel wound is gone.   Plan:  Continue current woundcare.  Goals Wound Therapy Goals - Improve the function of patient's integumentary system by progressing the wound(s) through the phases of wound healing by: Decrease Necrotic Tissue to: 0 Increase Granulation Tissue to: 100 Decrease Length/Width/Depth by (cm): heel wound healed Improve Drainage Characteristics: Min Patient/Family will be able to : verbalize the importance of wearing compression hose Additional Wound Therapy Goal: instructed in self lymph techniques. Lymph fluid to be decreased by 50% Goals/treatment plan/discharge plan were made with and agreed upon by patient/family: Yes Time For Goal Achievement:  (4 weeks) Wound Therapy - Potential for Goals: Good  Problem List Patient Active Problem List   Diagnosis Date Noted  . WEAKNESS 06/05/2008  . DYSPNEA ON EXERTION 11/15/2007  . CATARACT NOS 02/15/2007  . DIABETES MELLITUS, TYPE II, CONTROLLED, WITH COMPLICATIONS 08/24/2006  . RENAL FAILURE, ACUTE 07/30/2006  . HYPERLIPIDEMIA 04/26/2006  . ANEMIA-NOS 04/26/2006  . HYPERTENSION  04/26/2006  . KIDNEY DISEASE, CHRONIC, STAGE III 04/26/2006  . CEREBROVASCULAR ACCIDENT, HX OF 04/26/2006     [name], PTA/CLT 12/13/2012, 12:04 PM

## 2012-12-15 ENCOUNTER — Ambulatory Visit (HOSPITAL_COMMUNITY)
Admission: RE | Admit: 2012-12-15 | Discharge: 2012-12-15 | Disposition: A | Discharge status: Home / Self Care | Payer: Medicare Other | Source: Ambulatory Visit | Attending: Family Medicine | Admitting: Family Medicine

## 2012-12-15 NOTE — Progress Notes (Signed)
Physical Therapy - Wound Therapy  Treatment   Patient Details  Name: Steven [name] MRN: [id number] Date of Birth: [birth year]  Today's Date: 12/15/2012 Time: 1020-1055 Time Calculation (min): 35 min Charges: Selective debridement (= or < 20 cm)   Visit#: 26 of 36  Re-eval: 01/05/13  Subjective Subjective Assessment Subjective: Pt has not yet received new compression garment.  Pain Assessment Pain Assessment Pain Assessment: No/denies pain  Wound Therapy Wound 02/11/12 Contact dermatitis Leg medial aspect  4 different wounds the largest is 7cm x .5 appears backwad L with distal width being 2.5 w no depth.  All wounds covered. (Active)     Wound 06/12/12 Other (Comment)  Right water filled blistering over lower rt leg (Active)     Wound 06/16/12 Abrasion(s) Thigh Right;Lateral Scabbed area to right lateral thigh with scabbed area (Active)     Wound 10/05/12 Other (Comment) Heel Right stage 2 pressure ulcer (Active)  Site / Wound Assessment Red;Yellow 12/15/2012 12:07 PM  % Wound base Red or Granulating 80% 12/15/2012 12:07 PM  % Wound base Yellow 20% 12/15/2012 12:07 PM  % Wound base Black 0% 12/15/2012 12:07 PM  % Wound base Other (Comment) 0% 12/15/2012 12:07 PM  Peri-wound Assessment Edema 12/15/2012 12:07 PM  Wound Length (cm) 1 cm 12/08/2012  1:56 PM  Wound Width (cm) 2.2 cm 12/08/2012  1:56 PM  Wound Depth (cm) 1.2 cm 12/08/2012  1:56 PM  Undermining (cm) undermining from 4 0'clock to 10 o'clock approximately 1 cm  12/08/2012  1:56 PM  Closure None 12/06/2012 12:06 PM  Drainage Amount Minimal 12/15/2012 12:07 PM  Drainage Description No odor;Serosanguineous 12/15/2012 12:07 PM  Non-staged Wound Description Full thickness 12/15/2012 12:07 PM  Treatment Cleansed;Debridement (Selective);Hydrotherapy (Pulse lavage) 12/15/2012 12:07 PM  Dressing Type Silver hydrofiber;Tape dressing;Compression wrap 12/15/2012 12:07 PM  Dressing Changed Changed 12/13/2012 11:00 AM  Dressing Status  Clean;Dry;Intact 12/15/2012 12:07 PM   Hydrotherapy Pulsed lavage therapy - wound location: Rt heel all of wound Pulsed Lavage with Suction (psi): 4 psi Pulsed Lavage with Suction - Normal Saline Used: 1000 mL Pulsed Lavage Tip: Tip with splash shield Selective Debridement Selective Debridement - Location: slough wound bed of R heel.  Selective Debridement - Tools Used: Forceps;Scissors Selective Debridement - Tissue Removed: slough, dry skin, callous   Physical Therapy Assessment and Plan Wound Therapy - Assess/Plan/Recommendations Wound Therapy - Clinical Statement: Pt tolerates debridement well. Wound is without signs or sx of infection. Wound appears to approximating well. Maceration at periwound noted. Vaseline applied to this area to protect skin integrity.  Wound Plan: Continue wound care per PT POC.   Problem List Patient Active Problem List   Diagnosis Date Noted  . WEAKNESS 06/05/2008  . DYSPNEA ON EXERTION 11/15/2007  . CATARACT NOS 02/15/2007  . DIABETES MELLITUS, TYPE II, CONTROLLED, WITH COMPLICATIONS 08/24/2006  . RENAL FAILURE, ACUTE 07/30/2006  . HYPERLIPIDEMIA 04/26/2006  . ANEMIA-NOS 04/26/2006  . HYPERTENSION 04/26/2006  . KIDNEY DISEASE, CHRONIC, STAGE III 04/26/2006  . CEREBROVASCULAR ACCIDENT, HX OF 04/26/2006    Seth Bake, PTA  12/15/2012, 12:16 PM

## 2012-12-20 ENCOUNTER — Ambulatory Visit (HOSPITAL_COMMUNITY)
Admission: RE | Admit: 2012-12-20 | Discharge: 2012-12-20 | Disposition: A | Discharge status: Home / Self Care | Payer: Medicare Other | Source: Ambulatory Visit | Attending: Family Medicine | Admitting: Family Medicine

## 2012-12-20 NOTE — Progress Notes (Signed)
Physical Therapy - Wound Therapy  Treatment   Patient Details  Name: Steven [name] MRN: [id number] Date of Birth: 1940/12/06  Today's Date: 12/20/2012 Time: 6213-0865 Time Calculation (min): 35 min Charges: Selective debridement (= or < 20 cm)   Visit#: 27 of 36  Re-eval: 01/05/13  Subjective Subjective Assessment Subjective: Pt states that smaller Juxtafit wrap came in but it was still too big. A smaller one is being ordered.  Pain Assessment Pain Assessment Pain Assessment: No/denies pain  Wound Therapy Wound 02/11/12 Contact dermatitis Leg medial aspect  4 different wounds the largest is 7cm x .5 appears backwad L with distal width being 2.5 w no depth.  All wounds covered. (Active)     Wound 06/12/12 Other (Comment)  Right water filled blistering over lower rt leg (Active)     Wound 06/16/12 Abrasion(s) Thigh Right;Lateral Scabbed area to right lateral thigh with scabbed area (Active)     Wound 10/05/12 Other (Comment) Heel Right stage 2 pressure ulcer (Active)  Site / Wound Assessment Red;Yellow 12/20/2012 12:02 PM  % Wound base Red or Granulating 80% 12/20/2012 12:02 PM  % Wound base Yellow 20% 12/20/2012 12:02 PM  % Wound base Black 0% 12/20/2012 12:02 PM  % Wound base Other (Comment) 0% 12/20/2012 12:02 PM  Peri-wound Assessment Edema 12/20/2012 12:02 PM  Wound Length (cm) 1 cm 12/08/2012  1:56 PM  Wound Width (cm) 2.2 cm 12/08/2012  1:56 PM  Wound Depth (cm) 1.2 cm 12/08/2012  1:56 PM  Undermining (cm) undermining from 4 0'clock to 10 o'clock approximately 1 cm  12/08/2012  1:56 PM  Closure None 12/06/2012 12:06 PM  Drainage Amount Minimal 12/20/2012 12:02 PM  Drainage Description No odor;Serosanguineous 12/20/2012 12:02 PM  Non-staged Wound Description Full thickness 12/20/2012 12:02 PM  Treatment Cleansed;Debridement (Selective);Hydrotherapy (Pulse lavage) 12/20/2012 12:02 PM  Dressing Type Silver hydrofiber;Tape dressing;Compression wrap 12/20/2012 12:02 PM  Dressing  Changed Changed 12/20/2012 12:02 PM  Dressing Status Clean;Dry;Intact 12/20/2012 12:02 PM   Hydrotherapy Pulsed lavage therapy - wound location: Rt heel all of wound Pulsed Lavage with Suction (psi): 4 psi Pulsed Lavage with Suction - Normal Saline Used: 1000 mL Pulsed Lavage Tip: Tip with splash shield Selective Debridement Selective Debridement - Location: slough wound bed of R heel.  Selective Debridement - Tools Used: Forceps;Scissors Selective Debridement - Tissue Removed: slough, dry skin, callous   Physical Therapy Assessment and Plan Wound Therapy - Assess/Plan/Recommendations Wound Therapy - Clinical Statement: Pt tolerates debridement well. Wound is without signs or sx of infection. Wound appears to approximating well. Irritated area noted at right medial shin and lateral let just distal to knee. Xeroform applied to both of these areas to protect skin integrity.  Wound Plan: Continue wound care per PT POC.   Problem List Patient Active Problem List   Diagnosis Date Noted  . WEAKNESS 06/05/2008  . DYSPNEA ON EXERTION 11/15/2007  . CATARACT NOS 02/15/2007  . DIABETES MELLITUS, TYPE II, CONTROLLED, WITH COMPLICATIONS 08/24/2006  . RENAL FAILURE, ACUTE 07/30/2006  . HYPERLIPIDEMIA 04/26/2006  . ANEMIA-NOS 04/26/2006  . HYPERTENSION 04/26/2006  . KIDNEY DISEASE, CHRONIC, STAGE III 04/26/2006  . CEREBROVASCULAR ACCIDENT, HX OF 04/26/2006   Seth Bake, PTA 12/20/2012, 12:04 PM

## 2012-12-22 ENCOUNTER — Ambulatory Visit (HOSPITAL_COMMUNITY): Payer: Medicare Other | Admitting: Physical Therapy

## 2012-12-22 ENCOUNTER — Ambulatory Visit (HOSPITAL_COMMUNITY): Payer: Medicare Other | Admitting: *Deleted

## 2012-12-23 ENCOUNTER — Ambulatory Visit (HOSPITAL_COMMUNITY)
Admission: RE | Admit: 2012-12-23 | Discharge: 2012-12-23 | Disposition: A | Discharge status: Home / Self Care | Payer: Medicare Other | Source: Ambulatory Visit | Attending: Family Medicine | Admitting: Family Medicine

## 2012-12-23 NOTE — Progress Notes (Signed)
Physical Therapy - Wound Therapy  Treatment   Patient Details  Name: Steven [name] MRN: [id number] Date of Birth: 1941/01/06  Today's Date: 12/23/2012 Time: 4098-1191 Time Calculation (min): 45 min  Visit#: 28 of 36  Re-eval: 01/05/13 Charges:  Deb<20cm  Subjective Subjective Assessment Subjective: Pt reports compliance with compression garments.  Still has not recieved proper fitting juxtafit garment  Pain Assessment Pain Assessment Pain Assessment: No/denies pain  Wound Therapy Wound 02/11/12 Contact dermatitis Leg medial aspect  4 different wounds the largest is 7cm x .5 appears backwad L with distal width being 2.5 w no depth.  All wounds covered. (Active)     Wound 06/12/12 Other (Comment)  Right water filled blistering over lower rt leg (Active)     Wound 06/16/12 Abrasion(s) Thigh Right;Lateral Scabbed area to right lateral thigh with scabbed area (Active)     Wound 10/05/12 Other (Comment) Heel Right stage 2 pressure ulcer (Active)  Site / Wound Assessment Red;Yellow 12/23/2012  1:44 PM  % Wound base Red or Granulating 80% 12/23/2012  1:44 PM  % Wound base Yellow 20% 12/23/2012  1:44 PM  % Wound base Black 0% 12/23/2012  1:44 PM  % Wound base Other (Comment) 0% 12/23/2012  1:44 PM  Peri-wound Assessment Edema 12/23/2012  1:44 PM  Wound Length (cm) 1 cm 12/08/2012  1:56 PM  Wound Width (cm) 2.2 cm 12/08/2012  1:56 PM  Wound Depth (cm) 1.2 cm 12/08/2012  1:56 PM  Undermining (cm) undermining from 4 0'clock to 10 o'clock approximately 1 cm  12/08/2012  1:56 PM  Closure None 12/06/2012 12:06 PM  Drainage Amount Minimal 12/23/2012  1:44 PM  Drainage Description No odor;Serosanguineous 12/23/2012  1:44 PM  Non-staged Wound Description Full thickness 12/23/2012  1:44 PM  Treatment Hydrotherapy (Pulse lavage) 12/23/2012  1:44 PM  Dressing Type Silver hydrofiber;Tape dressing;Compression wrap 12/23/2012  1:44 PM  Dressing Changed Changed 12/23/2012  1:44 PM  Dressing Status  Clean;Dry;Intact 12/23/2012  1:44 PM   Hydrotherapy Pulsed lavage therapy - wound location: Rt heel all of wound Pulsed Lavage with Suction (psi): 4 psi Pulsed Lavage with Suction - Normal Saline Used: 1000 mL Pulsed Lavage Tip: Tip with splash shield Selective Debridement Selective Debridement - Location: slough wound bed of R heel.  Selective Debridement - Tools Used: Forceps;Scissors Selective Debridement - Tissue Removed: slough, dry skin, callous   Physical Therapy Assessment and Plan Wound Therapy - Assess/Plan/Recommendations Wound Therapy - Clinical Statement: Pt tolerates debridement well. Wound is without signs or sx of infection. Wound appears to approximating well. Area above right medial ankle still raw, no debridement required.  Area more proximal now healed. Xeroform applied to medial areas to protect skin integrity.  Performed vinegar bath using soaked gauze to [name] to improve skin integrity (increases PH and kills fungal).  Lotion applied after vinegar bath.  Wound Plan: Continue wound care per PT POC.        [name], PTA/CLT 12/23/2012, 1:47 PM

## 2012-12-27 ENCOUNTER — Ambulatory Visit (HOSPITAL_COMMUNITY)
Admission: RE | Admit: 2012-12-27 | Discharge: 2012-12-27 | Disposition: A | Discharge status: Home / Self Care | Payer: Medicare Other | Source: Ambulatory Visit | Attending: Family Medicine | Admitting: Family Medicine

## 2012-12-27 NOTE — Progress Notes (Signed)
Physical Therapy - Wound Therapy  Treatment   Patient Details  Name: Steven [name] MRN: [id number] Date of Birth: [birth year]  Today's Date: 12/27/2012 Time: 1000-1030 Time Calculation (min): 30 min Visit#: 29 of 36  Re-eval: 01/05/13 Charges:  Deb<20cm  Subjective Subjective Assessment Subjective: Pt was nearly 65' late for appt. due to car trouble.  Pt reports no pain or discomfort, however states he would like to wash his garments as they are soiled.   Attempted to contact Tan at [facility], however unable to speak with her.  Pain Assessment Pain Assessment Pain Assessment: No/denies pain  Wound Therapy Wound 02/11/12 Contact dermatitis Leg medial aspect  4 different wounds the largest is 7cm x .5 appears backwad L with distal width being 2.5 w no depth.  All wounds covered. (Active)     Wound 06/12/12 Other (Comment)  Right water filled blistering over lower rt leg (Active)     Wound 06/16/12 Abrasion(s) Thigh Right;Lateral Scabbed area to right lateral thigh with scabbed area (Active)     Wound 10/05/12 Other (Comment) Heel Right stage 2 pressure ulcer (Active)  Site / Wound Assessment Red;Yellow 12/27/2012 10:35 AM  % Wound base Red or Granulating 85% 12/27/2012 10:35 AM  % Wound base Yellow 15% 12/27/2012 10:35 AM  % Wound base Black 0% 12/27/2012 10:35 AM  % Wound base Other (Comment) 0% 12/27/2012 10:35 AM  Peri-wound Assessment Edema 12/27/2012 10:35 AM  Wound Length (cm) 1 cm 12/08/2012  1:56 PM  Wound Width (cm) 2.2 cm 12/08/2012  1:56 PM  Wound Depth (cm) 1.2 cm 12/08/2012  1:56 PM  Undermining (cm) undermining from 4 0'clock to 10 o'clock approximately 1 cm  12/08/2012  1:56 PM  Closure None 12/06/2012 12:06 PM  Drainage Amount Minimal 12/27/2012 10:35 AM  Drainage Description No odor;Serosanguineous 12/27/2012 10:35 AM  Non-staged Wound Description Full thickness 12/27/2012 10:35 AM  Treatment Hydrotherapy (Pulse lavage);Debridement (Selective) 12/27/2012 10:35 AM   Dressing Type Silver hydrofiber;Compression wrap 12/27/2012 10:35 AM  Dressing Changed Changed 12/27/2012 10:35 AM  Dressing Status Clean;Dry;Intact 12/27/2012 10:35 AM   Hydrotherapy Pulsed lavage therapy - wound location: Rt heel all of wound Pulsed Lavage with Suction (psi): 4 psi Pulsed Lavage with Suction - Normal Saline Used: 1000 mL Pulsed Lavage Tip: Tip with splash shield Selective Debridement Selective Debridement - Location: slough wound bed of R heel.  Selective Debridement - Tools Used: Forceps;Scissors Selective Debridement - Tissue Removed: slough, dry skin, callous   Physical Therapy Assessment and Plan Wound Therapy - Assess/Plan/Recommendations Wound Therapy - Clinical Statement: Slight odor from heel today, however due to stagnant dressing from 5 days ago.  Small openings postier and lateral [name], however 100% granulated.  covered areas with xeroform.  Heel wound still dressed with silver hydrofiber.  Used Coban 2 compression system to wrap [name] as pt to take garments home to wash.  Attemepted to contact Tan at [facility] regarding delay in receiving juxtafit garment.   Will continue to attempt contact as nearly 2 months and has not received proper fitting garment. Wound Plan: Continue wound care per PT POC.        Problem List Patient Active Problem List   Diagnosis Date Noted  . WEAKNESS 06/05/2008  . DYSPNEA ON EXERTION 11/15/2007  . CATARACT NOS 02/15/2007  . DIABETES MELLITUS, TYPE II, CONTROLLED, WITH COMPLICATIONS 08/24/2006  . RENAL FAILURE, ACUTE 07/30/2006  . HYPERLIPIDEMIA 04/26/2006  . ANEMIA-NOS 04/26/2006  . HYPERTENSION 04/26/2006  . KIDNEY DISEASE, CHRONIC, STAGE  III 04/26/2006  . CEREBROVASCULAR ACCIDENT, HX OF 04/26/2006      [name], PTA/CLT 12/27/2012, 10:49 AM

## 2012-12-29 ENCOUNTER — Ambulatory Visit (HOSPITAL_COMMUNITY)
Admission: RE | Admit: 2012-12-29 | Discharge: 2012-12-29 | Disposition: A | Discharge status: Home / Self Care | Payer: Medicare Other | Source: Ambulatory Visit | Attending: Family Medicine | Admitting: Family Medicine

## 2012-12-29 NOTE — Progress Notes (Addendum)
Physical Therapy - Wound Therapy  Treatment   Patient Details  Name: Steven [name] MRN: [id number] Date of Birth: 1941/01/03  Today's Date: 12/29/2012 Time: 4098-1191 Time Calculation (min): 40 min Charges:  Deb <20cm Visit#: 30 of 36  Re-eval: 01/05/13  Subjective Subjective Assessment Subjective: Pt was nearly 61' late for appt. due to car trouble.  Pt reports no pain or discomfort, however states he would like to wash his garments as they are soiled.   Attempted to contact Steven [name] at [facility], however unable to speak with her.  Pain Assessment Pain Assessment Pain Assessment: No/denies pain  Wound Therapy Wound 02/11/12 Contact dermatitis Leg medial aspect  4 different wounds the largest is 7cm x .5 appears backwad L with distal width being 2.5 w no depth.  All wounds covered. (Active)     Wound 06/12/12 Other (Comment)  Right water filled blistering over lower rt leg (Active)     Wound 06/16/12 Abrasion(s) Thigh Right;Lateral Scabbed area to right lateral thigh with scabbed area (Active)     Wound 10/05/12 Other (Comment) Heel Right stage 2 pressure ulcer (Active)  Site / Wound Assessment Red;Yellow 12/29/2012 10:55 AM  % Wound base Red or Granulating 85% 12/29/2012 10:55 AM  % Wound base Yellow 15% 12/29/2012 10:55 AM  % Wound base Black 0% 12/29/2012 10:55 AM  % Wound base Other (Comment) 0% 12/29/2012 10:55 AM  Peri-wound Assessment Edema 12/29/2012 10:55 AM  Wound Length (cm) 1 cm 12/08/2012  1:56 PM  Wound Width (cm) 2.2 cm 12/08/2012  1:56 PM  Wound Depth (cm) 1.2 cm 12/08/2012  1:56 PM  Undermining (cm) undermining from 4 0'clock to 10 o'clock approximately 1 cm  12/08/2012  1:56 PM  Closure None 12/06/2012 12:06 PM  Drainage Amount Minimal 12/29/2012 10:55 AM  Drainage Description No odor;Serosanguineous 12/29/2012 10:55 AM  Non-staged Wound Description Full thickness 12/29/2012 10:55 AM  Treatment Hydrotherapy (Pulse lavage) 12/29/2012 10:55 AM  Dressing Type Silver  hydrofiber;Compression wrap 12/29/2012 10:55 AM  Dressing Changed Changed 12/29/2012 10:55 AM  Dressing Status Clean;Dry;Intact 12/29/2012 10:55 AM   Hydrotherapy Pulsed lavage therapy - wound location: Rt heel all of wound Pulsed Lavage with Suction (psi): 4 psi Pulsed Lavage with Suction - Normal Saline Used: 1000 mL Pulsed Lavage Tip: Tip with splash shield Selective Debridement Selective Debridement - Location: slough wound bed of R heel.  Selective Debridement - Tools Used: Forceps;Scissors Selective Debridement - Tissue Removed: slough, dry skin, callous   Physical Therapy Assessment and Plan Wound Therapy - Assess/Plan/Recommendations Pt Assessment:  [name] with continued decompression using the coban 2, smaller wounds, one lateral ankle and other posterior ankle are 100% granulated.  Irrigated without need for debridement and dressed with xeroform.  Juxtalite garment put into place following woundcare.  Heel wound continues to fill in.  To continue to try and contact Steven [name] at [facility] Wound Plan: Continue wound care per PT POC.       Problem List Patient Active Problem List   Diagnosis Date Noted  . WEAKNESS 06/05/2008  . DYSPNEA ON EXERTION 11/15/2007  . CATARACT NOS 02/15/2007  . DIABETES MELLITUS, TYPE II, CONTROLLED, WITH COMPLICATIONS 08/24/2006  . RENAL FAILURE, ACUTE 07/30/2006  . HYPERLIPIDEMIA 04/26/2006  . ANEMIA-NOS 04/26/2006  . HYPERTENSION 04/26/2006  . KIDNEY DISEASE, CHRONIC, STAGE III 04/26/2006  . CEREBROVASCULAR ACCIDENT, HX OF 04/26/2006    GP  Functional Assessment Tool Used: clinical judgement  Functional Limitation: Other PT primary  Other PT Primary Current Status ([id number]):  At least 40 percent but less than 60 percent impaired, limited or restricted  Other PT Primary Goal Status (U9811): At least 20 percent but less than 40 percent impaired, limited or restricted    [name], PTA/CLT 12/29/2012, 10:57 AM

## 2013-01-03 ENCOUNTER — Ambulatory Visit (HOSPITAL_COMMUNITY)
Admission: RE | Admit: 2013-01-03 | Discharge: 2013-01-03 | Disposition: A | Discharge status: Home / Self Care | Payer: Medicare Other | Source: Ambulatory Visit | Attending: Family Medicine | Admitting: Family Medicine

## 2013-01-03 DIAGNOSIS — S81809A Unspecified open wound, unspecified lower leg, initial encounter: Secondary | ICD-10-CM | POA: Insufficient documentation

## 2013-01-03 DIAGNOSIS — IMO0001 Reserved for inherently not codable concepts without codable children: Secondary | ICD-10-CM | POA: Insufficient documentation

## 2013-01-03 DIAGNOSIS — M7989 Other specified soft tissue disorders: Secondary | ICD-10-CM | POA: Insufficient documentation

## 2013-01-03 DIAGNOSIS — S81009A Unspecified open wound, unspecified knee, initial encounter: Secondary | ICD-10-CM | POA: Insufficient documentation

## 2013-01-03 NOTE — Progress Notes (Signed)
Physical Therapy - Wound Therapy  Treatment   Patient Details  Name: Steven [name] MRN: [id number] Date of Birth: 06-Dec-1940  Today's Date: 01/03/2013 Time: 4403-4742 Time Calculation (min): 35 min Visit#: 31 of 36  Re-eval: 01/05/13 Charges:  Deb <20cm  Subjective Subjective Assessment Subjective: Pt still without garment.  Contacted Tan at [employer] and states she has garment just have not contacted patient.  urged to do so ASAP  Pain Assessment Pain Assessment Pain Assessment: No/denies pain  Wound Therapy Wound 02/11/12 Contact dermatitis Leg medial aspect  4 different wounds the largest is 7cm x .5 appears backwad L with distal width being 2.5 w no depth.  All wounds covered. (Active)     Wound 06/12/12 Other (Comment)  Right water filled blistering over lower rt leg (Active)     Wound 06/16/12 Abrasion(s) Thigh Right;Lateral Scabbed area to right lateral thigh with scabbed area (Active)     Wound 10/05/12 Other (Comment) Heel Right stage 2 pressure ulcer (Active)  Site / Wound Assessment Red;Yellow 01/03/2013  9:35 AM  % Wound base Red or Granulating 85% 01/03/2013  9:35 AM  % Wound base Yellow 15% 01/03/2013  9:35 AM  % Wound base Black 0% 01/03/2013  9:35 AM  % Wound base Other (Comment) 0% 01/03/2013  9:35 AM  Peri-wound Assessment Edema 01/03/2013  9:35 AM  Wound Length (cm) 1 cm 12/08/2012  1:56 PM  Wound Width (cm) 2.2 cm 12/08/2012  1:56 PM  Wound Depth (cm) 1.2 cm 12/08/2012  1:56 PM  Undermining (cm) undermining from 4 0'clock to 10 o'clock approximately 1 cm  12/08/2012  1:56 PM  Closure None 12/06/2012 12:06 PM  Drainage Amount Minimal 01/03/2013  9:35 AM  Drainage Description No odor;Serosanguineous 01/03/2013  9:35 AM  Non-staged Wound Description Full thickness 01/03/2013  9:35 AM  Treatment Hydrotherapy (Pulse lavage) 01/03/2013  9:35 AM  Dressing Type Silver hydrofiber;Compression wrap 01/03/2013  9:35 AM  Dressing Changed Changed 01/03/2013  9:35 AM  Dressing Status  Clean;Dry;Intact 01/03/2013  9:35 AM   Hydrotherapy Pulsed lavage therapy - wound location: Rt heel all of wound Pulsed Lavage with Suction (psi): 4 psi Pulsed Lavage with Suction - Normal Saline Used: 1000 mL Pulsed Lavage Tip: Tip with splash shield Selective Debridement Selective Debridement - Location: slough wound bed of R heel.  Selective Debridement - Tools Used: Forceps;Scissors Selective Debridement - Tissue Removed: slough, dry skin, callous   Physical Therapy Assessment and Plan Wound Therapy - Assess/Plan/Recommendations Wound Therapy - Clinical Statement: Heel continues to become more shallow with increasing granulation and approximation of wound borders.  Still with slight odor, however possibly from stagnant dressing/drainage.  Smaller wounds on posterior and lateral ankle continue to decrease in size and are approximating well.  Continued with irrigation and dressing with xeroform. Wound Plan: Continue wound care per PT POC.       Problem List Patient Active Problem List   Diagnosis Date Noted  . WEAKNESS 06/05/2008  . DYSPNEA ON EXERTION 11/15/2007  . CATARACT NOS 02/15/2007  . DIABETES MELLITUS, TYPE II, CONTROLLED, WITH COMPLICATIONS 08/24/2006  . RENAL FAILURE, ACUTE 07/30/2006  . HYPERLIPIDEMIA 04/26/2006  . ANEMIA-NOS 04/26/2006  . HYPERTENSION 04/26/2006  . KIDNEY DISEASE, CHRONIC, STAGE III 04/26/2006  . CEREBROVASCULAR ACCIDENT, HX OF 04/26/2006     [name], PTA/CLT 01/03/2013, 10:08 AM

## 2013-01-05 ENCOUNTER — Ambulatory Visit (HOSPITAL_COMMUNITY)
Admission: RE | Admit: 2013-01-05 | Discharge: 2013-01-05 | Disposition: A | Discharge status: Home / Self Care | Payer: Medicare Other | Source: Ambulatory Visit | Attending: *Deleted | Admitting: *Deleted

## 2013-01-05 NOTE — Progress Notes (Signed)
Physical Therapy - Wound Therapy  Treatment   Patient Details  Name: Steven [name] MRN: [id number] Date of Birth: 1941/04/18  Today's Date: 01/05/2013 Time: 4098-1191 Time Calculation (min): 30 min Visit#: 32 of 36  Re-eval: 01/05/13 Charges:  Deb<20cm  Subjective Subjective Assessment Subjective: Pt states Tan from [facility] contacted him this morning and is scheduled to get his garment today.   Wound Therapy Wound 02/11/12 Contact dermatitis Leg medial aspect  4 different wounds the largest is 7cm x .5 appears backwad L with distal width being 2.5 w no depth.  All wounds covered. (Active)     Wound 06/12/12 Other (Comment)  Right water filled blistering over lower rt leg (Active)     Wound 06/16/12 Abrasion(s) Thigh Right;Lateral Scabbed area to right lateral thigh with scabbed area (Active)     Wound 10/05/12 Other (Comment) Heel Right stage 2 pressure ulcer (Active)  Site / Wound Assessment Red;Yellow 01/05/2013 11:33 AM  % Wound base Red or Granulating 90% 01/05/2013 11:33 AM  % Wound base Yellow 10% 01/05/2013 11:33 AM  % Wound base Black 0% 01/05/2013 11:33 AM  % Wound base Other (Comment) 0% 01/05/2013 11:33 AM  Peri-wound Assessment Edema 01/05/2013 11:33 AM  Wound Length (cm) 1 cm 12/08/2012  1:56 PM  Wound Width (cm) 2.2 cm 12/08/2012  1:56 PM  Wound Depth (cm) 1.2 cm 12/08/2012  1:56 PM  Undermining (cm) undermining from 4 0'clock to 10 o'clock approximately 1 cm  12/08/2012  1:56 PM  Closure None 12/06/2012 12:06 PM  Drainage Amount Minimal 01/05/2013 11:33 AM  Drainage Description No odor;Serosanguineous 01/05/2013 11:33 AM  Non-staged Wound Description Partial thickness 01/05/2013 11:33 AM  Treatment Hydrotherapy (Pulse lavage) 01/05/2013 11:33 AM  Dressing Type Xeroform with Compression wrap 01/05/2013 11:33 AM  Dressing Changed Changed 01/05/2013 11:33 AM  Dressing Status Clean;Dry;Intact 01/05/2013 11:33 AM   Hydrotherapy Pulsed lavage therapy - wound location: Rt heel all of  wound Pulsed Lavage with Suction (psi): 4 psi Pulsed Lavage with Suction - Normal Saline Used: 1000 mL Pulsed Lavage Tip: Tip with splash shield Selective Debridement Selective Debridement - Location: slough wound bed of R heel.  Selective Debridement - Tools Used: Forceps;Scissors Selective Debridement - Tissue Removed: slough, dry skin, callous   Physical Therapy Assessment and Plan Wound Therapy - Assess/Plan/Recommendations Wound Therapy - Clinical Statement: Continued shallowness and increasing granulation in Rt heel.  Changed dressing to xeroform; anticipate d/c of pulse lavage next week if continues to progress.   Wound Plan: Continue wound care per PT POC.        Problem List Patient Active Problem List   Diagnosis Date Noted  . WEAKNESS 06/05/2008  . DYSPNEA ON EXERTION 11/15/2007  . CATARACT NOS 02/15/2007  . DIABETES MELLITUS, TYPE II, CONTROLLED, WITH COMPLICATIONS 08/24/2006  . RENAL FAILURE, ACUTE 07/30/2006  . HYPERLIPIDEMIA 04/26/2006  . ANEMIA-NOS 04/26/2006  . HYPERTENSION 04/26/2006  . KIDNEY DISEASE, CHRONIC, STAGE III 04/26/2006  . CEREBROVASCULAR ACCIDENT, HX OF 04/26/2006     [name], PTA/CLT 01/05/2013, 11:38 AM

## 2013-01-10 ENCOUNTER — Ambulatory Visit (HOSPITAL_COMMUNITY)
Admission: RE | Admit: 2013-01-10 | Discharge: 2013-01-10 | Disposition: A | Discharge status: Home / Self Care | Payer: Medicare Other | Source: Ambulatory Visit | Attending: Family Medicine | Admitting: Family Medicine

## 2013-01-10 NOTE — Progress Notes (Signed)
Physical Therapy - Wound Therapy  Treatment   Patient Details  Name: Steven [name] MRN: [id number] Date of Birth: May 28, 1941  Today's Date: 01/10/2013 Time: 4098-1191 Time Calculation (min): 35 min Visit#: 33 of 36  Re-eval: 01/05/13 Charges:  Deb <20cm  Subjective Subjective Assessment Subjective: Pt states he has not heard from Tan again since last thursday and still does not have his garment.  States he has no pain today.  Pain Assessment Pain Assessment Pain Assessment: No/denies pain  Wound Therapy Wound 02/11/12 Contact dermatitis Leg medial aspect  4 different wounds the largest is 7cm x .5 appears backwad L with distal width being 2.5 w no depth.  All wounds covered. (Active)     Wound 06/12/12 Other (Comment)  Right water filled blistering over lower rt leg (Active)     Wound 06/16/12 Abrasion(s) Thigh Right;Lateral Scabbed area to right lateral thigh with scabbed area (Active)     Wound 10/05/12 Other (Comment) Heel Right stage 2 pressure ulcer (Active)  Site / Wound Assessment Red;Yellow 01/10/2013 11:56 AM  % Wound base Red or Granulating 90% 01/10/2013 11:56 AM  % Wound base Yellow 10% 01/10/2013 11:56 AM  % Wound base Black 0% 01/10/2013 11:56 AM  % Wound base Other (Comment) 0% 01/10/2013 11:56 AM  Peri-wound Assessment Edema 01/10/2013 11:56 AM  Wound Length (cm) 1 cm 12/08/2012  1:56 PM  Wound Width (cm) 2.2 cm 12/08/2012  1:56 PM  Wound Depth (cm) 1.2 cm 12/08/2012  1:56 PM  Undermining (cm) undermining from 4 0'clock to 10 o'clock approximately 1 cm  12/08/2012  1:56 PM  Closure None 12/06/2012 12:06 PM  Drainage Amount Minimal 01/05/2013 11:33 AM  Drainage Description Serosanguineous;No odor 01/10/2013 11:56 AM  Non-staged Wound Description Partial thickness 01/05/2013 11:33 AM  Treatment Hydrotherapy (Pulse lavage) 01/10/2013 11:56 AM  Dressing Type Impregnated gauze (bismuth);Compression wrap 01/10/2013 11:56 AM  Dressing Changed New 01/10/2013 11:56 AM  Dressing  Status Clean;Dry;Intact 01/10/2013 11:56 AM   Hydrotherapy Pulsed lavage therapy - wound location: Rt heel wound Pulsed Lavage with Suction (psi): 4 psi Pulsed Lavage with Suction - Normal Saline Used: 1000 mL Pulsed Lavage Tip: Tip with splash shield Selective Debridement Selective Debridement - Location: slough wound bed of R heel.  Selective Debridement - Tools Used: Forceps Selective Debridement - Tissue Removed: slough, dry skin, callous around perimeter   Physical Therapy Assessment and Plan Wound Therapy - Assess/Plan/Recommendations Wound Therapy - Clinical Statement: Nearly flush with surrounding skin now with increasing approximation of wound borders.  Responding well to xeroform.  Smaller wounds lateral and posterior ankle are now healed.  Pulse lavage is no longer needed due to little wound depth. Wound Plan: Continue wound care per PT POC. Discharge pulse lavage.  Order sent to Dr. Sudie Bailey to begin gait training and LE strengthening.      Problem List Patient Active Problem List   Diagnosis Date Noted  . WEAKNESS 06/05/2008  . DYSPNEA ON EXERTION 11/15/2007  . CATARACT NOS 02/15/2007  . DIABETES MELLITUS, TYPE II, CONTROLLED, WITH COMPLICATIONS 08/24/2006  . RENAL FAILURE, ACUTE 07/30/2006  . HYPERLIPIDEMIA 04/26/2006  . ANEMIA-NOS 04/26/2006  . HYPERTENSION 04/26/2006  . KIDNEY DISEASE, CHRONIC, STAGE III 04/26/2006  . CEREBROVASCULAR ACCIDENT, HX OF 04/26/2006     [name], PTA/CLT 01/10/2013, 12:01 PM

## 2013-01-12 ENCOUNTER — Ambulatory Visit (HOSPITAL_COMMUNITY): Payer: Medicare Other | Admitting: Physical Therapy

## 2013-01-17 ENCOUNTER — Ambulatory Visit (HOSPITAL_COMMUNITY)
Admission: RE | Admit: 2013-01-17 | Discharge: 2013-01-17 | Disposition: A | Discharge status: Home / Self Care | Payer: Medicare Other | Source: Ambulatory Visit | Attending: Family Medicine | Admitting: Family Medicine

## 2013-01-17 NOTE — Progress Notes (Signed)
Physical Therapy - Wound Therapy Re-evaluation  Re-evaluation   Patient Details  Name: Steven [name] MRN: [id number] Date of Birth: [birth year]  Today's Date: 01/17/2013 Time: 4098-1191 Time Calculation (min): 30 min Charges: Selective debridement (= or < 20 cm)   Visit#: 34 of 42  Re-eval: 01/05/13  Subjective Subjective Assessment Subjective: Pt states he has not heard anything else about receiving his new garment.   Pain Assessment Pain Assessment Pain Assessment: No/denies pain  Wound Therapy Wound 02/11/12 Contact dermatitis Leg medial aspect  4 different wounds the largest is 7cm x .5 appears backwad L with distal width being 2.5 w no depth.  All wounds covered. (Active)     Wound 06/12/12 Other (Comment)  Right water filled blistering over lower rt leg (Active)     Wound 06/16/12 Abrasion(s) Thigh Right;Lateral Scabbed area to right lateral thigh with scabbed area (Active)     Wound 10/05/12 Other (Comment) Heel Right stage 2 pressure ulcer (Active)  Site / Wound Assessment Red;Yellow 01/17/2013 11:57 AM  % Wound base Red or Granulating 90% was 80% 01/17/2013 11:57 AM  % Wound base Yellow 10% was 20% 01/17/2013 11:57 AM  % Wound base Black 0% 01/17/2013 11:57 AM  % Wound base Other (Comment) 0% 01/17/2013 11:57 AM  Peri-wound Assessment Edema 01/17/2013 11:57 AM  Wound Length (cm) 0.8 cm was 1.0 01/17/2013 11:57 AM  Wound Width (cm) 2 cm was 2.2 01/17/2013 11:57 AM  Wound Depth (cm) 0.6 cm was 1 cm 01/17/2013 11:57 AM  Undermining (cm) undermining from 4 0'clock to 10 o'clock approximately 1 cm  12/08/2012  1:56 PM  Closure None 12/06/2012 12:06 PM  Drainage Amount Minimal 01/05/2013 11:33 AM  Drainage Description Serosanguineous;No odor 01/17/2013 11:57 AM  Non-staged Wound Description Partial thickness 01/05/2013 11:33 AM  Treatment Cleansed;Debridement (Selective) 01/17/2013 11:57 AM  Dressing Type Impregnated gauze (bismuth);Compression wrap 01/17/2013 11:57 AM  Dressing  Changed New 01/17/2013 11:57 AM  Dressing Status Clean;Dry;Intact 01/17/2013 11:57 AM   Selective Debridement Selective Debridement - Location: wound bed of R heel.  Selective Debridement - Tools Used: Forceps Selective Debridement - Tissue Removed: slough and dry skin   Physical Therapy Assessment and Plan Wound Therapy - Assess/Plan/Recommendations Wound Therapy - Clinical Statement: Wound is healing well. Wound has decreased .2 cm in width and length since last measurement. Depth has decreased .6 cm. Pt tolerates debridement well. Wound appears to be responding well to xeroform. Pulse lavage not completed as it is no longer needed.  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    Goals Wound Therapy Goals - Improve the function of patient's integumentary system by progressing the wound(s) through the phases of wound healing by: Decrease Necrotic Tissue to: 0 Decrease Necrotic Tissue - Progress: Progressing toward goal Increase Granulation Tissue to: 100 Increase Granulation Tissue - Progress: Progressing toward goal Decrease Length/Width/Depth by (cm): heel wound healed Decrease Length/Width/Depth - Progress: Progressing toward goal Improve Drainage Characteristics: Min Improve Drainage Characteristics - Progress: Met Patient/Family will be able to : verbalize the importance of wearing compression hose Patient/Family Instruction Goal - Progress: Met Additional Wound Therapy Goal: instructed in self lymph techniques. Lymph fluid to be decreased by 50% Additional Wound Therapy  Goal -  Progress: Met Goals/treatment plan/discharge plan were made with and agreed upon by patient/family: Yes Time For Goal Achievement:  (4 weeks) Wound Therapy - Potential for Goals: Good  Problem List Patient Active Problem List   Diagnosis Date Noted  . WEAKNESS 06/05/2008  . DYSPNEA ON EXERTION 11/15/2007  . CATARACT NOS 02/15/2007  . DIABETES MELLITUS, TYPE II, CONTROLLED, WITH COMPLICATIONS 08/24/2006  . RENAL FAILURE, ACUTE 07/30/2006  . HYPERLIPIDEMIA 04/26/2006  . ANEMIA-NOS 04/26/2006  . HYPERTENSION 04/26/2006  . KIDNEY DISEASE, CHRONIC, STAGE III 04/26/2006  . CEREBROVASCULAR ACCIDENT, HX OF 04/26/2006    GP Functional Assessment Tool Used: Clinical judgement Functional Limitation: Other PT primary Other PT Primary Current Status (Z6109): At least 1 percent but less than 20 percent impaired, limited or restricted Other PT Primary Goal Status (U0454): 0 percent impaired, limited or restricted (Goal updated to [facility] from CI)  Seth Bake, PTA  01/17/2013, 12:12 PM.   Your signature is required to indicate approval of the treatment plan as stated above.  Please sign and return making a copy for your files.  You may hard copy or send electronically.  Please check one: ___1.  Approve of this plan  ___2.  Approve of this plan with the following changes.   ____________________________                             _____________ Physician                                                                      Date

## 2013-01-19 ENCOUNTER — Ambulatory Visit (HOSPITAL_COMMUNITY)
Admission: RE | Admit: 2013-01-19 | Discharge: 2013-01-19 | Disposition: A | Discharge status: Home / Self Care | Payer: Medicare Other | Source: Ambulatory Visit | Attending: Family Medicine | Admitting: Family Medicine

## 2013-01-19 NOTE — Progress Notes (Signed)
Physical Therapy - Wound Therapy  Treatment   Patient Details  Name: Steven [name] MRN: [id number] Date of Birth: 05-20-1941  Today's Date: 01/19/2013 Time: 4098-1191 Time Calculation (min): 25 min Charge: selective debridement <20 cm  Visit#: 35 of 42  Re-eval: 02/14/13  Subjective Subjective Assessment Subjective: Pt stated, "I have between a good and wonderful day.", no pain   Pain Assessment Pain Assessment Pain Assessment: No/denies pain  Wound Therapy  01/19/13 1141  Subjective Assessment  Subjective Pt stated, "I have between a good and wonderful day.", no pain   Wound 10/05/12 Other (Comment) Heel Right stage 2 pressure ulcer  Date First Assessed/Time First Assessed: 10/05/12 0945   Wound Type: Other (Comment)  Location: Heel  Location Orientation: Right  Wound Description (Comments): stage 2 pressure ulcer  Site / Wound Assessment Red;Yellow  % Wound base Red or Granulating 90%  % Wound base Yellow 10%  % Wound base Black 0%  Peri-wound Assessment Edema  Drainage Description Serosanguineous;No odor  Treatment Cleansed;Debridement (Selective)  Dressing Type Impregnated gauze (bismuth);Compression wrap  Dressing Changed New  Dressing Status Clean;Dry;Intact  Selective Debridement  Selective Debridement - Location wound bed of R heel.   Selective Debridement - Tools Used Scalpel;Forceps  Selective Debridement - Tissue Removed slough and dry skin  Wound Therapy - Assess/Plan/Recommendations  Wound Therapy - Clinical Statement Wound continues to improve in granualtion tissue.  Debridement with scalpel removed calloused periwound to promote healing, continues with xeroform, abd pad and juxafit for compression/edema control  Wound Plan Continue with current POC.    Selective Debridement Selective Debridement - Location: wound bed of R heel.  Selective Debridement - Tools Used: Scalpel;Forceps Selective Debridement - Tissue Removed: slough and dry skin    Physical Therapy Assessment and Plan Wound Therapy - Assess/Plan/Recommendations Wound Therapy - Clinical Statement: Wound continues to improve in granualtion tissue.  Debridement with scalpel removed calloused periwound to promote healing, continues with xeroform, abd pad and juxafit for compression/edema control Wound Plan: Continue with current POC.      Goals    Problem List Patient Active Problem List   Diagnosis Date Noted  . WEAKNESS 06/05/2008  . DYSPNEA ON EXERTION 11/15/2007  . CATARACT NOS 02/15/2007  . DIABETES MELLITUS, TYPE II, CONTROLLED, WITH COMPLICATIONS 08/24/2006  . RENAL FAILURE, ACUTE 07/30/2006  . HYPERLIPIDEMIA 04/26/2006  . ANEMIA-NOS 04/26/2006  . HYPERTENSION 04/26/2006  . KIDNEY DISEASE, CHRONIC, STAGE III 04/26/2006  . CEREBROVASCULAR ACCIDENT, HX OF 04/26/2006    GP    Juel Burrow 01/19/2013, 11:52 AM

## 2013-01-24 ENCOUNTER — Ambulatory Visit (HOSPITAL_COMMUNITY)
Admission: RE | Admit: 2013-01-24 | Discharge: 2013-01-24 | Disposition: A | Discharge status: Home / Self Care | Payer: Medicare Other | Source: Ambulatory Visit | Attending: Family Medicine | Admitting: Family Medicine

## 2013-01-24 NOTE — Progress Notes (Signed)
Physical Therapy - Wound Therapy  Treatment   Patient Details  Name: Steven [name] MRN: [id number] Date of Birth: [birth year]  Today's Date: 01/24/2013 Time: 1025-1105 Time Calculation (min): 40 min Visit#: 36 of 42  Re-eval: 02/14/13 Charges:  Deb <20cm, manual 15'  Subjective Subjective Assessment Subjective: Pt stated, "I have between a good and wonderful day.", no pain    Wound Therapy Wound 02/11/12 Contact dermatitis Leg medial aspect  4 different wounds the largest is 7cm x .5 appears backwad L with distal width being 2.5 w no depth.  All wounds covered. (Active)     Wound 06/12/12 Other (Comment)  Right water filled blistering over lower rt leg (Active)     Wound 06/16/12 Abrasion(s) Thigh Right;Lateral Scabbed area to right lateral thigh with scabbed area (Active)     Wound 10/05/12 Other (Comment) Heel Right stage 2 pressure ulcer (Active)  Site / Wound Assessment Red;Yellow 01/24/2013 12:00 PM  % Wound base Red or Granulating 90% 01/24/2013 12:00 PM  % Wound base Yellow 10% 01/24/2013 12:00 PM  % Wound base Black 0% 01/24/2013 12:00 PM  % Wound base Other (Comment) 0% 01/17/2013 11:57 AM  Peri-wound Assessment Edema 01/24/2013 12:00 PM  Wound Length (cm) 0.8 cm 01/17/2013 11:57 AM  Wound Width (cm) 2 cm 01/17/2013 11:57 AM  Wound Depth (cm) 0.6 cm 01/17/2013 11:57 AM  Undermining (cm) undermining from 4 0'clock to 10 o'clock approximately 1 cm  12/08/2012  1:56 PM  Closure None 12/06/2012 12:06 PM  Drainage Amount Minimal 01/05/2013 11:33 AM  Drainage Description Serosanguineous;No odor 01/24/2013 12:00 PM  Non-staged Wound Description Partial thickness 01/05/2013 11:33 AM  Treatment Cleansed;Debridement (Selective) 01/24/2013 12:00 PM  Dressing Type Impregnated gauze (bismuth);Compression wrap 01/24/2013 12:00 PM  Dressing Changed Changed 01/24/2013 12:00 PM  Dressing Status Clean;Dry;Intact 01/24/2013 12:00 PM   Selective Debridement Selective Debridement - Location: wound bed  of R heel and lateral LE  Selective Debridement - Tools Used: Scalpel;Forceps Selective Debridement - Tissue Removed: slough and dry skin   Physical Therapy Assessment and Plan Wound Therapy - Assess/Plan/Recommendations Wound Therapy - Clinical Statement: PT with huge shallow wound on lateral aspect of [name] measuring 8cmX5cmX0cm., 100% granulated.  Entire [name] with increased edema due to pt having wrap off due to being soiled.  Contacted [employer] and spoke to Tan regarding failure to get new garment to patient.  Expressed concern of pt degressing due to not having proper garments.  Tan reported she had had the garment for weeks, however unable to get with patient due to being so busy at work.  Tan reported she would get garment to patient today.  Wrapped [name] with short stretch bandages and foam to achieve decompression.  Dressed lateral LE wound with xeroform as well as heel wound.  Wound Plan: Continue with current POC.       Problem List Patient Active Problem List   Diagnosis Date Noted  . WEAKNESS 06/05/2008  . DYSPNEA ON EXERTION 11/15/2007  . CATARACT NOS 02/15/2007  . DIABETES MELLITUS, TYPE II, CONTROLLED, WITH COMPLICATIONS 08/24/2006  . RENAL FAILURE, ACUTE 07/30/2006  . HYPERLIPIDEMIA 04/26/2006  . ANEMIA-NOS 04/26/2006  . HYPERTENSION 04/26/2006  . KIDNEY DISEASE, CHRONIC, STAGE III 04/26/2006  . CEREBROVASCULAR ACCIDENT, HX OF 04/26/2006     [name], PTA/CLT 01/24/2013, 12:29 PM

## 2013-01-26 ENCOUNTER — Ambulatory Visit (HOSPITAL_COMMUNITY)
Admission: RE | Admit: 2013-01-26 | Discharge: 2013-01-26 | Disposition: A | Discharge status: Home / Self Care | Payer: Medicare Other | Source: Ambulatory Visit | Attending: Family Medicine | Admitting: Family Medicine

## 2013-01-26 NOTE — Progress Notes (Signed)
Physical Therapy - Wound Therapy  Treatment   Patient Details  Name: Steven [name] MRN: [id number] Date of Birth: February 20, 1941  Today's Date: 01/26/2013 Time: 1020-1100 Time Calculation (min): 40 min Visit#: 37 of 42  Re-eval: 02/14/13 Charges:  Deb<20cm  Subjective Subjective Assessment Subjective: Tan from [facility] pharmacy brought 2 sizes of juxtafit (small and medium).  Sized to fit for medium today.  Pt states his leg feels much better after wrapped with short stretch last visit.   Wound Therapy Wound 02/11/12 Contact dermatitis Leg medial aspect  4 different wounds the largest is 7cm x .5 appears backwad L with distal width being 2.5 w no depth.  All wounds covered. (Active)     Wound 06/12/12 Other (Comment)  Right water filled blistering over lower rt leg (Active)     Wound 06/16/12 Abrasion(s) Thigh Right;Lateral Scabbed area to right lateral thigh with scabbed area (Active)     Wound 10/05/12 Other (Comment) Heel Right stage 2 pressure ulcer (Active)  Site / Wound Assessment Red;Yellow 01/26/2013 11:00 AM  % Wound base Red or Granulating 90% 01/26/2013 11:00 AM  % Wound base Yellow 10% 01/26/2013 11:00 AM  % Wound base Black 0% 01/26/2013 11:00 AM  % Wound base Other (Comment) 0% 01/17/2013 11:57 AM  Peri-wound Assessment Edema 01/26/2013 11:00 AM  Wound Length (cm) 0.8 cm 01/17/2013 11:57 AM  Wound Width (cm) 2 cm 01/17/2013 11:57 AM  Wound Depth (cm) 0.6 cm 01/17/2013 11:57 AM  Undermining (cm) undermining from 4 0'clock to 10 o'clock approximately 1 cm  12/08/2012  1:56 PM  Closure None 12/06/2012 12:06 PM  Drainage Amount Minimal 01/05/2013 11:33 AM  Drainage Description Serosanguineous;No odor 01/26/2013 11:00 AM  Non-staged Wound Description Partial thickness 01/05/2013 11:33 AM  Treatment Cleansed;Debridement (Selective) 01/26/2013 11:00 AM  Dressing Type Impregnated gauze (bismuth);Compression wrap 01/26/2013 11:00 AM  Dressing Changed Changed 01/26/2013 11:00 AM  Dressing  Status Clean;Dry;Intact 01/26/2013 11:00 AM   Selective Debridement Selective Debridement - Location: wound bed of R heel and lateral LE  Selective Debridement - Tools Used: Scalpel;Forceps Selective Debridement - Tissue Removed: slough and dry skin   Physical Therapy Assessment and Plan Wound Therapy - Assess/Plan/Recommendations Wound Therapy - Clinical Statement: Wound on lateral aspect of [name] with moderate drainage, however with slightly reduced size today, 100% granulated.  Entire [name] with decreased edema after compression bandaging with short stretch and foam last visit.  Measured [name] and fitted with appropriate size juxtafit.  Sent other back with pt to return to [facility].   Changed dressing on lateral LE wound to silver alginate (aquacel) to help with absorption and wicking away of drainage.  Wound Plan: Continue with current POC.       Problem List Patient Active Problem List   Diagnosis Date Noted  . WEAKNESS 06/05/2008  . DYSPNEA ON EXERTION 11/15/2007  . CATARACT NOS 02/15/2007  . DIABETES MELLITUS, TYPE II, CONTROLLED, WITH COMPLICATIONS 08/24/2006  . RENAL FAILURE, ACUTE 07/30/2006  . HYPERLIPIDEMIA 04/26/2006  . ANEMIA-NOS 04/26/2006  . HYPERTENSION 04/26/2006  . KIDNEY DISEASE, CHRONIC, STAGE III 04/26/2006  . CEREBROVASCULAR ACCIDENT, HX OF 04/26/2006     [name], PTA/CLT 01/26/2013, 11:28 AM

## 2013-01-31 ENCOUNTER — Ambulatory Visit (HOSPITAL_COMMUNITY)
Admission: RE | Admit: 2013-01-31 | Discharge: 2013-01-31 | Disposition: A | Discharge status: Home / Self Care | Payer: Medicare Other | Source: Ambulatory Visit | Attending: Family Medicine | Admitting: Family Medicine

## 2013-01-31 DIAGNOSIS — IMO0001 Reserved for inherently not codable concepts without codable children: Secondary | ICD-10-CM | POA: Insufficient documentation

## 2013-01-31 DIAGNOSIS — S81009A Unspecified open wound, unspecified knee, initial encounter: Secondary | ICD-10-CM | POA: Insufficient documentation

## 2013-01-31 DIAGNOSIS — M7989 Other specified soft tissue disorders: Secondary | ICD-10-CM | POA: Insufficient documentation

## 2013-01-31 NOTE — Progress Notes (Signed)
Physical Therapy - Wound Therapy  Treatment   Patient Details  Name: Azavier Creson MRN: [id number] Date of Birth: 07-25-1940  Today's Date: 01/31/2013 Time: 4098-1191 Time Calculation (min): 34 min Visit#: 38 of 42  Re-eval: 02/14/13  Subjective Subjective Assessment Subjective: PT states leg feels much better with new garments giving more compression.  Wound Therapy Wound 02/11/12 Contact dermatitis Leg medial aspect  4 different wounds the largest is 7cm x .5 appears backwad L with distal width being 2.5 w no depth.  All wounds covered. (Active)     Wound 06/12/12 Other (Comment)  Right water filled blistering over lower rt leg (Active)     Wound 06/16/12 Abrasion(s) Thigh Right;Lateral Scabbed area to right lateral thigh with scabbed area (Active)     Wound 10/05/12 Other (Comment) Heel Right stage 2 pressure ulcer (Active)  Site / Wound Assessment Red;Yellow 01/31/2013  3:00 PM  % Wound base Red or Granulating 90% 01/31/2013  3:00 PM  % Wound base Yellow 10% 01/31/2013  3:00 PM  % Wound base Black 0% 01/31/2013  3:00 PM  % Wound base Other (Comment) 0% 01/17/2013 11:57 AM  Peri-wound Assessment Edema 01/31/2013  3:00 PM  Wound Length (cm) 0.8 cm 01/17/2013 11:57 AM  Wound Width (cm) 2 cm 01/17/2013 11:57 AM  Wound Depth (cm) 0.6 cm 01/17/2013 11:57 AM  Undermining (cm) undermining from 4 0'clock to 10 o'clock approximately 1 cm  12/08/2012  1:56 PM  Closure None 12/06/2012 12:06 PM  Drainage Amount Minimal 01/05/2013 11:33 AM  Drainage Description Serosanguineous;No odor 01/31/2013  3:00 PM  Non-staged Wound Description Partial thickness 01/05/2013 11:33 AM  Treatment Cleansed;Debridement (Selective) 01/31/2013  3:00 PM  Dressing Type Impregnated gauze (bismuth);Compression wrap 01/31/2013  3:00 PM  Dressing Changed Changed 01/31/2013  3:00 PM  Dressing Status Clean;Dry;Intact 01/31/2013  3:00 PM   Selective Debridement Selective Debridement - Location: wound bed of R heel and lateral LE   Selective Debridement - Tools Used: Scalpel;Forceps Selective Debridement - Tissue Removed: slough and dry skin   Physical Therapy Assessment and Plan Wound Therapy - Assess/Plan/Recommendations Wound Therapy - Clinical Statement: wound on lateral LE much improved this visit; continued  100% granulated.  Good compression achieved with juxtafit garment.  Heel wound continues to fill in with less drainage.  Dressed lateral LE wound with xeroform as well as heel wound.  Wound Plan: Continue with current POC.       Problem List Patient Active Problem List   Diagnosis Date Noted  . WEAKNESS 06/05/2008  . DYSPNEA ON EXERTION 11/15/2007  . CATARACT NOS 02/15/2007  . DIABETES MELLITUS, TYPE II, CONTROLLED, WITH COMPLICATIONS 08/24/2006  . RENAL FAILURE, ACUTE 07/30/2006  . HYPERLIPIDEMIA 04/26/2006  . ANEMIA-NOS 04/26/2006  . HYPERTENSION 04/26/2006  . KIDNEY DISEASE, CHRONIC, STAGE III 04/26/2006  . CEREBROVASCULAR ACCIDENT, HX OF 04/26/2006     [name], PTA/CLT 01/31/2013, 3:44 PM

## 2013-02-02 ENCOUNTER — Telehealth (HOSPITAL_COMMUNITY): Payer: Self-pay

## 2013-02-02 ENCOUNTER — Ambulatory Visit (HOSPITAL_COMMUNITY): Payer: Medicare Other | Admitting: Physical Therapy

## 2013-02-07 ENCOUNTER — Ambulatory Visit (HOSPITAL_COMMUNITY)
Admission: RE | Admit: 2013-02-07 | Discharge: 2013-02-07 | Disposition: A | Discharge status: Home / Self Care | Payer: Medicare Other | Source: Ambulatory Visit | Attending: Family Medicine | Admitting: Family Medicine

## 2013-02-07 NOTE — Progress Notes (Signed)
Physical Therapy - Wound Therapy  Treatment   Patient Details  Name: Steven [name] MRN: [id number] Date of Birth: 1940-12-28  Today's Date: 02/07/2013 Time: 1033-1102 Time Calculation (min): 29 min Visit#: 39 of 42  Re-eval: 02/14/13  Subjective Subjective Assessment Subjective: Pt reports continued improvment   Wound Therapy Wound 02/11/12 Contact dermatitis Leg medial aspect  4 different wounds the largest is 7cm x .5 appears backwad L with distal width being 2.5 w no depth.  All wounds covered. (Active)     Wound 06/12/12 Other (Comment)  Right water filled blistering over lower rt leg (Active)     Wound 06/16/12 Abrasion(s) Thigh Right;Lateral Scabbed area to right lateral thigh with scabbed area (Active)     Wound 10/05/12 Other (Comment) Heel Right stage 2 pressure ulcer (Active)  Site / Wound Assessment Red;Yellow 02/07/2013 12:00 PM  % Wound base Red or Granulating 95% 02/07/2013 12:00 PM  % Wound base Yellow 5% 02/07/2013 12:00 PM  % Wound base Black 0% 02/07/2013 12:00 PM  % Wound base Other (Comment) 0% 01/17/2013 11:57 AM  Peri-wound Assessment Edema 02/07/2013 12:00 PM  Wound Length (cm) 0.8 cm 01/17/2013 11:57 AM  Wound Width (cm) 2 cm 01/17/2013 11:57 AM  Wound Depth (cm) 0.6 cm 01/17/2013 11:57 AM  Undermining (cm) undermining from 4 0'clock to 10 o'clock approximately 1 cm  12/08/2012  1:56 PM  Closure None 12/06/2012 12:06 PM  Drainage Amount Minimal 01/05/2013 11:33 AM  Drainage Description Serosanguineous;No odor 02/07/2013 12:00 PM  Non-staged Wound Description Partial thickness 01/05/2013 11:33 AM  Treatment Cleansed;Debridement (Selective) 02/07/2013 12:00 PM  Dressing Type Impregnated gauze (bismuth);Compression wrap 02/07/2013 12:00 PM  Dressing Changed Changed 02/07/2013 12:00 PM  Dressing Status Clean;Dry;Intact 02/07/2013 12:00 PM   Selective Debridement Selective Debridement - Location: wound bed of R heel and lateral LE  Selective Debridement - Tools Used:  Forceps Selective Debridement - Tissue Removed: slough and dry skin   Physical Therapy Assessment and Plan Wound Therapy - Assess/Plan/Recommendations Wound Therapy - Clinical Statement: Wound on lateral aspect of [name] nearly healed with overall decreased size 4X2X0 cm (was measuring 8cmX5cmX0cm), 100% granulated. [name] without edema due to containment of garments.  Heel wound with increased granulation and decreased size.  Continued dressing with xeroform and use of juxtafit.  Anticipate D/C in next couple of weeks. Wound Plan: Continue with current POC.  Re-evaluate X 2 more visits.  Returns to MD next Wednesday 9/17.       Problem List Patient Active Problem List   Diagnosis Date Noted  . WEAKNESS 06/05/2008  . DYSPNEA ON EXERTION 11/15/2007  . CATARACT NOS 02/15/2007  . DIABETES MELLITUS, TYPE II, CONTROLLED, WITH COMPLICATIONS 08/24/2006  . RENAL FAILURE, ACUTE 07/30/2006  . HYPERLIPIDEMIA 04/26/2006  . ANEMIA-NOS 04/26/2006  . HYPERTENSION 04/26/2006  . KIDNEY DISEASE, CHRONIC, STAGE III 04/26/2006  . CEREBROVASCULAR ACCIDENT, HX OF 04/26/2006     [name], PTA/CLT 02/07/2013, 12:03 PM

## 2013-02-09 ENCOUNTER — Ambulatory Visit (HOSPITAL_COMMUNITY)
Admission: RE | Admit: 2013-02-09 | Discharge: 2013-02-09 | Disposition: A | Discharge status: Home / Self Care | Payer: Medicare Other | Source: Ambulatory Visit | Attending: Family Medicine | Admitting: Family Medicine

## 2013-02-09 NOTE — Progress Notes (Addendum)
Physical Therapy - Wound Therapy  Treatment   Patient Details  Name: Steven [name] MRN: [id number] Date of Birth: 01/06/1941  Today's Date: 02/09/2013 Time: 1035-1100 Time Calculation (min): 25 min Charges: Dressing change x 1  Visit#: 40 of 42  Re-eval: 02/14/13  Subjective Subjective Assessment Subjective: Pt is pain free and without complaint.  Pain Assessment Pain Assessment Pain Assessment: No/denies pain  Wound Therapy Wound 02/11/12 Contact dermatitis Leg medial aspect  4 different wounds the largest is 7cm x .5 appears backwad L with distal width being 2.5 w no depth.  All wounds covered. (Active)     Wound 06/12/12 Other (Comment)  Right water filled blistering over lower rt leg (Active)     Wound 06/16/12 Abrasion(s) Thigh Right;Lateral Scabbed area to right lateral thigh with scabbed area (Active)     Wound 10/05/12 Other (Comment) Heel Right stage 2 pressure ulcer (Active)  Site / Wound Assessment Red;Yellow 02/09/2013 11:43 AM  % Wound base Red or Granulating 95% 02/09/2013 11:43 AM  % Wound base Yellow 5% 02/09/2013 11:43 AM  % Wound base Black 0% 02/09/2013 11:43 AM  % Wound base Other (Comment) 0% 01/17/2013 11:57 AM  Peri-wound Assessment Edema 02/09/2013 11:43 AM  Wound Length (cm) 0.8 cm 01/17/2013 11:57 AM  Wound Width (cm) 2 cm 01/17/2013 11:57 AM  Wound Depth (cm) 0.6 cm 01/17/2013 11:57 AM  Undermining (cm) undermining from 4 0'clock to 10 o'clock approximately 1 cm  12/08/2012  1:56 PM  Closure None 12/06/2012 12:06 PM  Drainage Amount Minimal 01/05/2013 11:33 AM  Drainage Description Serosanguineous;No odor 02/09/2013 11:43 AM  Non-staged Wound Description Partial thickness 01/05/2013 11:33 AM  Treatment Cleansed 02/09/2013 11:43 AM  Dressing Type Impregnated gauze (bismuth);Compression wrap 02/09/2013 11:43 AM  Dressing Changed Changed 02/09/2013 11:43 AM  Dressing Status Clean;Dry;Intact 02/09/2013 11:43 AM   Selective Debridement Selective Debridement -  Location: None needed   Physical Therapy Assessment and Plan Wound Therapy - Assess/Plan/Recommendations Wound Therapy - Clinical Statement: Wounds are progressing well. No selective debridement needed after cleansing. Both wounds are almost healed. Continued with xeroform and compression bandage. Pt is without complaint throughout session.  Wound Plan: Continue with current POC.  Problem List Patient Active Problem List   Diagnosis Date Noted  . WEAKNESS 06/05/2008  . DYSPNEA ON EXERTION 11/15/2007  . CATARACT NOS 02/15/2007  . DIABETES MELLITUS, TYPE II, CONTROLLED, WITH COMPLICATIONS 08/24/2006  . RENAL FAILURE, ACUTE 07/30/2006  . HYPERLIPIDEMIA 04/26/2006  . ANEMIA-NOS 04/26/2006  . HYPERTENSION 04/26/2006  . KIDNEY DISEASE, CHRONIC, STAGE III 04/26/2006  . CEREBROVASCULAR ACCIDENT, HX OF 04/26/2006   Functional Assessment Tool Used: clinical judgement  Functional Limitation: Other PT primary  Other PT Primary Current Status (W0981): At least 40 percent but less than 60 percent impaired, limited or restricted  Other PT Primary Goal Status (X9147): At least 20 percent but less than 40 percent impaired, limited or restricted    Seth Bake, PTA  02/09/2013, 11:47 AM

## 2013-02-14 ENCOUNTER — Ambulatory Visit (HOSPITAL_COMMUNITY)
Admission: RE | Admit: 2013-02-14 | Discharge: 2013-02-14 | Disposition: A | Discharge status: Home / Self Care | Payer: Medicare Other | Source: Ambulatory Visit | Attending: Family Medicine | Admitting: Family Medicine

## 2013-02-14 NOTE — Progress Notes (Signed)
Physical Therapy - Wound Therapy Re-evaluation  Re-evaluation   Patient Details  Name: Steven [name] MRN: [id number] Date of Birth: [birth year]  Today's Date: 02/14/2013 Time: 1030-1100 Time Calculation (min): 30 min Visit#: 41 of 45  Re-eval: 03/14/13 Charges:  Deb <20cm  Subjective Subjective Assessment Subjective: Pt remains complaint with wearing juxtafit compression garments.  No complaints or pain reported.   Wound Therapy Wound 02/11/12 Contact dermatitis Leg medial aspect  4 different wounds the largest is 7cm x .5 appears backwad L with distal width being 2.5 w no depth.  All wounds covered. (Active)     Wound 06/12/12 Other (Comment)  Right water filled blistering over lower rt leg (Active)     Wound 06/16/12 Abrasion(s) Thigh Right;Lateral Scabbed area to right lateral thigh with scabbed area (Active)     Wound 10/05/12 Other (Comment) Heel Right stage 2 pressure ulcer (Active)  Site / Wound Assessment Red;Yellow 02/14/2013 11:00 AM  % Wound base Red or Granulating 95% 02/14/2013 11:00 AM  % Wound base Yellow 5% 02/14/2013 11:00 AM  % Wound base Black 0% 02/14/2013 11:00 AM  % Wound base Other (Comment) 0% 01/17/2013 11:57 AM  Peri-wound Assessment Edema 02/14/2013 11:00 AM  Wound Length (cm) 0.5 cm (was 0.8 cm) 02/14/2013 11:00 AM  Wound Width (cm) 0.8 cm (was 2 cm) 02/14/2013 11:00 AM  Wound Depth (cm) 0.3 cm (was 0.6 cm) 02/14/2013 11:00 AM  Undermining (cm) undermining from 4 0'clock to 10 o'clock approximately 1 cm  12/08/2012  1:56 PM  Closure None 12/06/2012 12:06 PM  Drainage Amount Minimal 01/05/2013 11:33 AM  Drainage Description Serosanguineous;No odor 02/14/2013 11:00 AM  Non-staged Wound Description Partial thickness 01/05/2013 11:33 AM  Treatment Cleansed;Debridement (Selective) 02/14/2013 11:00 AM  Dressing Type Impregnated gauze (bismuth);Compression wrap juxtafit 02/14/2013 11:00 AM  Dressing Changed Changed 02/14/2013 11:00 AM  Dressing Status Clean;Dry;Intact  02/14/2013 11:00 AM   Selective Debridement Selective Debridement - Location: perimeter of wound to calloused areas to promote approximation Selective Debridement - Tools Used: Forceps Selective Debridement - Tissue Removed: callous and slough perimeter of wound   Physical Therapy Assessment and Plan Wound Therapy - Assess/Plan/Recommendations Wound Therapy - Clinical Statement: Continued reduction of wound size and depth Rt heel.  Rt lateral LE wound is now healed without further need of intervention.  Rt heel is nearly healed with 95% granulation.  Anticipate heel will be fully healed in 2-4 weeks.   Factors Delaying/Impairing Wound Healing: Immobility;Multiple medical problems;Vascular compromise;Diabetes Mellitus Hydrotherapy Plan: Debridement;Dressing change Wound Therapy - Frequency: Other (comment) (2 X week) Wound Plan: Continue with current POC.      Goals Wound Therapy Goals - Improve the function of patient's integumentary system by progressing the wound(s) through the phases of wound healing by: Decrease Necrotic Tissue to: 0 Decrease Necrotic Tissue - Progress: Progressing toward goal Increase Granulation Tissue to: 100 Increase Granulation Tissue - Progress: Progressing toward goal Decrease Length/Width/Depth by (cm): heel wound healed Decrease Length/Width/Depth - Progress: Progressing toward goal Improve Drainage Characteristics: Min Patient/Family will be able to : verbalize the importance of wearing compression hose Patient/Family Instruction Goal - Progress: Met Additional Wound Therapy Goal: instructed in self lymph techniques. Lymph fluid to be decreased by 50% Additional Wound Therapy Goal - Progress: Met Goals/treatment plan/discharge plan were made with and agreed upon by patient/family: Yes Time For Goal Achievement:  (4 weeks) Wound Therapy - Potential for Goals: Good  Problem List Patient Active Problem List   Diagnosis Date Noted  . WEAKNESS  06/05/2008   . DYSPNEA ON EXERTION 11/15/2007  . CATARACT NOS 02/15/2007  . DIABETES MELLITUS, TYPE II, CONTROLLED, WITH COMPLICATIONS 08/24/2006  . RENAL FAILURE, ACUTE 07/30/2006  . HYPERLIPIDEMIA 04/26/2006  . ANEMIA-NOS 04/26/2006  . HYPERTENSION 04/26/2006  . KIDNEY DISEASE, CHRONIC, STAGE III 04/26/2006  . CEREBROVASCULAR ACCIDENT, HX OF 04/26/2006     [name], PTA/CLT 02/14/2013, 12:19 PM

## 2013-02-16 ENCOUNTER — Ambulatory Visit (HOSPITAL_COMMUNITY)
Admission: RE | Admit: 2013-02-16 | Discharge: 2013-02-16 | Disposition: A | Discharge status: Home / Self Care | Payer: Medicare Other | Source: Ambulatory Visit | Attending: Family Medicine | Admitting: Family Medicine

## 2013-02-16 NOTE — Progress Notes (Signed)
Physical Therapy - Wound Therapy  Treatment   Patient Details  Name: Steven [name] MRN: [id number] Date of Birth: Oct 01, 1940  Today's Date: 02/16/2013 Time: 1020-1050 Time Calculation (min): 30 min  Visit#: 42 of 45  Re-eval: 03/14/13  Subjective Subjective Assessment Subjective: Pt reports check up went well at Dr. Osborne Casco office yesterday.  Order received to begin gait training.  Appt scheduled for next week.   Wound Therapy Wound 02/11/12 Contact dermatitis Leg medial aspect  4 different wounds the largest is 7cm x .5 appears backwad L with distal width being 2.5 w no depth.  All wounds covered. (Active)     Wound 06/12/12 Other (Comment)  Right water filled blistering over lower rt leg (Active)     Wound 06/16/12 Abrasion(s) Thigh Right;Lateral Scabbed area to right lateral thigh with scabbed area (Active)     Wound 10/05/12 Other (Comment) Heel Right stage 2 pressure ulcer (Active)  Site / Wound Assessment Red;Yellow 02/16/2013 11:00 AM  % Wound base Red or Granulating 95% 02/16/2013 11:00 AM  % Wound base Yellow 5% 02/16/2013 11:00 AM  % Wound base Black 0% 02/16/2013 11:00 AM  % Wound base Other (Comment) 0% 01/17/2013 11:57 AM  Peri-wound Assessment Edema 02/16/2013 11:00 AM  Wound Length (cm) 0.5 cm 02/14/2013 11:00 AM  Wound Width (cm) 0.8 cm 02/14/2013 11:00 AM  Wound Depth (cm) 0.3 cm 02/14/2013 11:00 AM  Undermining (cm) undermining from 4 0'clock to 10 o'clock approximately 1 cm  12/08/2012  1:56 PM  Closure None 12/06/2012 12:06 PM  Drainage Amount Minimal 01/05/2013 11:33 AM  Drainage Description Serosanguineous;No odor 02/16/2013 11:00 AM  Non-staged Wound Description Partial thickness 01/05/2013 11:33 AM  Treatment Cleansed;Debridement (Selective) 02/16/2013 11:00 AM  Dressing Type Impregnated gauze (bismuth);Compression wrap 02/16/2013 11:00 AM  Dressing Changed Changed 02/16/2013 11:00 AM  Dressing Status Clean;Dry;Intact 02/16/2013 11:00 AM   Selective  Debridement Selective Debridement - Location: perimeter of wound to calloused areas to promote approximation Selective Debridement - Tools Used: Forceps Selective Debridement - Tissue Removed: callous and slough perimeter of wound   Physical Therapy Assessment and Plan Wound Therapy - Assess/Plan/Recommendations Wound Therapy - Clinical Statement: Continued improvement in Rt heel wound.  Pt scheduled for gait evaluation next week. Factors Delaying/Impairing Wound Healing: Immobility;Multiple medical problems;Vascular compromise;Diabetes Mellitus Hydrotherapy Plan: Debridement;Dressing change Wound Therapy - Frequency: Other (comment) (2 X week) Wound Plan: Continue with current POC.       Problem List Patient Active Problem List   Diagnosis Date Noted  . WEAKNESS 06/05/2008  . DYSPNEA ON EXERTION 11/15/2007  . CATARACT NOS 02/15/2007  . DIABETES MELLITUS, TYPE II, CONTROLLED, WITH COMPLICATIONS 08/24/2006  . RENAL FAILURE, ACUTE 07/30/2006  . HYPERLIPIDEMIA 04/26/2006  . ANEMIA-NOS 04/26/2006  . HYPERTENSION 04/26/2006  . KIDNEY DISEASE, CHRONIC, STAGE III 04/26/2006  . CEREBROVASCULAR ACCIDENT, HX OF 04/26/2006     [name], PTA/CLT 02/16/2013, 11:58 AM

## 2013-02-21 ENCOUNTER — Ambulatory Visit (HOSPITAL_COMMUNITY)
Admission: RE | Admit: 2013-02-21 | Discharge: 2013-02-21 | Disposition: A | Discharge status: Home / Self Care | Payer: Medicare Other | Source: Ambulatory Visit | Attending: Family Medicine | Admitting: Family Medicine

## 2013-02-21 NOTE — Progress Notes (Addendum)
Physical Therapy - Wound Therapy  Treatment   Patient Details  Name: Steven [name] MRN: [id number] Date of Birth: 12/14/1940  Today's Date: 02/21/2013 Time: 4098-1191 Time Calculation (min): 25 min Charges: Self care x 23'  Visit#: 43 of 45  Re-eval: 03/14/13  Subjective Subjective Assessment Subjective: Pt is pain free and without complaint.   Pain Assessment Pain Assessment Pain Assessment: No/denies pain  Wound Therapy Wound 02/11/12 Contact dermatitis Leg medial aspect  4 different wounds the largest is 7cm x .5 appears backwad L with distal width being 2.5 w no depth.  All wounds covered. (Active)     Wound 06/12/12 Other (Comment)  Right water filled blistering over lower rt leg (Active)     Wound 06/16/12 Abrasion(s) Thigh Right;Lateral Scabbed area to right lateral thigh with scabbed area (Active)     Wound 10/05/12 Other (Comment) Heel Right stage 2 pressure ulcer (Active)  Site / Wound Assessment Red;Yellow 02/21/2013 11:56 AM  % Wound base Red or Granulating 100% 02/21/2013 11:56 AM  % Wound base Yellow 0% 02/21/2013 11:56 AM  % Wound base Black 0% 02/21/2013 11:56 AM  % Wound base Other (Comment) 0% 01/17/2013 11:57 AM  Peri-wound Assessment Edema 02/21/2013 11:56 AM  Wound Length (cm) 0.5 cm 02/14/2013 11:00 AM  Wound Width (cm) 0.8 cm 02/14/2013 11:00 AM  Wound Depth (cm) 0.3 cm 02/14/2013 11:00 AM  Undermining (cm) undermining from 4 0'clock to 10 o'clock approximately 1 cm  12/08/2012  1:56 PM  Closure None 12/06/2012 12:06 PM  Drainage Amount Minimal 01/05/2013 11:33 AM  Drainage Description Serosanguineous;No odor 02/21/2013 11:56 AM  Non-staged Wound Description Partial thickness 01/05/2013 11:33 AM  Treatment Cleansed 02/21/2013 11:56 AM  Dressing Type Impregnated gauze (bismuth);Compression wrap 02/21/2013 11:56 AM  Dressing Changed Changed 02/21/2013 11:56 AM  Dressing Status Clean;Dry;Intact 02/21/2013 11:56 AM   Selective Debridement Selective Debridement -  Location: None needed, cleansed only   Physical Therapy Assessment and Plan Wound Therapy - Assess/Plan/Recommendations Wound Therapy - Clinical Statement: Wound continues to progress well. No debridement necessary. Wound cleansed and dressing changed. Juxtafit wrap applied over dressing. Wound Plan: Continue wound care per PT POC. Complete evaluation for strength and gait next session.   [id number] CI [id number] CI Problem List Patient Active Problem List   Diagnosis Date Noted  . WEAKNESS 06/05/2008  . DYSPNEA ON EXERTION 11/15/2007  . CATARACT NOS 02/15/2007  . DIABETES MELLITUS, TYPE II, CONTROLLED, WITH COMPLICATIONS 08/24/2006  . RENAL FAILURE, ACUTE 07/30/2006  . HYPERLIPIDEMIA 04/26/2006  . ANEMIA-NOS 04/26/2006  . HYPERTENSION 04/26/2006  . KIDNEY DISEASE, CHRONIC, STAGE III 04/26/2006  . CEREBROVASCULAR ACCIDENT, HX OF 04/26/2006    Seth Bake, PTA  02/21/2013, 12:02 PM

## 2013-02-23 ENCOUNTER — Ambulatory Visit (HOSPITAL_COMMUNITY)
Admission: RE | Admit: 2013-02-23 | Discharge: 2013-02-23 | Disposition: A | Discharge status: Home / Self Care | Payer: Medicare Other | Source: Ambulatory Visit | Attending: Family Medicine | Admitting: Family Medicine

## 2013-02-23 DIAGNOSIS — R5381 Other malaise: Secondary | ICD-10-CM | POA: Diagnosis present

## 2013-02-23 DIAGNOSIS — R2689 Other abnormalities of gait and mobility: Secondary | ICD-10-CM | POA: Insufficient documentation

## 2013-02-23 NOTE — Evaluation (Signed)
Physical Therapy Evaluation  Patient Details  Name: Steven [name] MRN: [id number] Date of Birth: 07-03-1940  Today's Date: 02/23/2013 Time:  [phone] PT Time Calculation (min): 45 min Charge eval             Visit#: 1 of 8  Re-eval: 03/25/13 Assessment Diagnosis: difficulty walking Prior Therapy: for wound care   Authorization: medicare    Authorization Visit#: 1 of 8   Past Medical History:  Past Medical History  Diagnosis Date  . Amputated below knee   . Coronary artery disease   . Diabetes mellitus   . Hypertension   . Hypercholesteremia    Past Surgical History:  Past Surgical History  Procedure Laterality Date  . Amputation      Subjective Symptoms/Limitations Symptoms: Pt is a 72 yo male who lives I.  He has had progressive difficuly with ambulation for some time now but recently had a significant woung on his [name],(the pt has a BKA on his LT) and has needed to be limited in his weightbearing for several months which has significantly weakened him to the point where just transfering is difficult for him.  He is now being referred to PT to improve his safety with ambulation.   Pertinent History: HTN, DM, Lt BKA, CVA,  How long can you sit comfortably?: no problem How long can you stand comfortably?: less than  How long can you walk comfortably?: Pt is only able to take a few steps at this point. Pain Assessment Currently in Pain?: No/denies  Precautions/Restrictions  Precautions Precautions: Fall  Balance Screening Balance Screen Has the patient fallen in the past 6 months: No  Cognition/Observation Cognition Overall Cognitive Status: Within Functional Limits for tasks assessed Observation/Other Assessments Observations: pt bent at 35 degree flextion due to hip flexion contracture.      Assessment RLE Strength Right Hip Flexion: 3-/5 Right Hip Extension: 2-/5 Right Hip ABduction: 2-/5 Right Knee Flexion: 2/5 Right Knee Extension:  (5-/5) Right  Ankle Dorsiflexion: 3-/5 Right Ankle Plantar Flexion: 2-/5 LLE Strength Left Hip Flexion: 2+/5 Left Hip Extension: 2-/5 Left Hip ABduction: 2/5 Left Knee Flexion: 2/5 Left Knee Extension: 5/5  Exercise/Treatments Mobility/Balance  Bed Mobility Supine to Sit: 5: Supervision Transfers Stand to Sit: 1: +2 Total assist Stand to Sit: Patient Percentage: 20% Stand to Sit Details: from normal chair height Stand Pivot Transfers: 1: +2 Total assist Stand Pivot Transfer Details (indicate cue type and reason): to get sit to stand after this it is supervision. Transfer via Lift Equipment:  (Pt is able to come sit to stand with seat at 22") Ambulation/Gait Ambulation/Gait: Yes Ambulation/Gait Assistance: 6: Modified independent (Device/Increase time) Ambulation Distance (Feet): 80 Feet Assistive device: Rolling walker Static Standing Balance Static Standing - Balance Support: No upper extremity supported Static Standing - Level of Assistance: 5: Stand by assistance Static Standing - Comment/# of Minutes: 0 (10 seconds; able to stand for two minutes with UE supported )    Seated Other Seated Knee Exercises: Rt ankle dorsiflexion/plantarflexion x 10 Other Seated Knee Exercises: sit to stand x 5 Supine Straight Leg Raises: Both;10 reps Other Supine Knee Exercises: hip abduction x 10 B  Other Supine Knee Exercises: heelslides x 10 B     Physical Therapy Assessment and Plan PT Assessment and Plan Pt will benefit from skilled therapeutic intervention in order to improve on the following deficits: Decreased activity tolerance;Decreased strength;Difficulty walking;Decreased range of motion;Decreased balance Rehab Potential: Good PT Frequency: Min 2X/week PT Duration: 4 weeks  PT Treatment/Interventions: Gait training;Therapeutic exercise;Balance training PT Plan: PT will benefit from exercises that incorporate balance and strength ie sit to stand , standing marching. standing hip exercises.   Particular stressing glut and abductor strengthening.l    Goals Home Exercise Program Pt/caregiver will Perform Home Exercise Program: For increased strengthening PT Short Term Goals Time to Complete Short Term Goals: 2 weeks PT Short Term Goal 1: Pt to be able to come sit to stand from a standard chair height with mod.-max  assist  of one PT Short Term Goal 2: Pt able to transfer with mod assist of one. PT Short Term Goal 3: Pt to be able to stand without UE support x one minute. PT Long Term Goals Time to Complete Long Term Goals: 4 weeks PT Long Term Goal 1: I in advance HEP PT Long Term Goal 2: Pt able to come sit to stand from a standard chair height with min-mod assist of one Long Term Goal 3: Pt able to stand without UE support x 2 minutes Long Term Goal 4: Pt to be able to stand upright with 20 degree hip flexion  PT Long Term Goal 5: Pt to be able to walk with walker for 200 feet  Problem List Patient Active Problem List   Diagnosis Date Noted  . Poor balance 02/23/2013  . WEAKNESS 06/05/2008  . DYSPNEA ON EXERTION 11/15/2007  . CATARACT NOS 02/15/2007  . DIABETES MELLITUS, TYPE II, CONTROLLED, WITH COMPLICATIONS 08/24/2006  . RENAL FAILURE, ACUTE 07/30/2006  . HYPERLIPIDEMIA 04/26/2006  . ANEMIA-NOS 04/26/2006  . HYPERTENSION 04/26/2006  . KIDNEY DISEASE, CHRONIC, STAGE III 04/26/2006  . CEREBROVASCULAR ACCIDENT, HX OF 04/26/2006    PT - End of Session Equipment Utilized During Treatment: Gait belt  GP Functional Assessment Tool Used: Clinical judgement Functional Limitation: Changing and maintaining body position Changing and Maintaining Body Position Current Status (Z6109): At least 80 percent but less than 100 percent impaired, limited or restricted Changing and Maintaining Body Position Goal Status (U0454): At least 60 percent but less than 80 percent impaired, limited or restricted  RUSSELL,CINDY 02/23/2013, 9:29 AM  Physician Documentation Your signature  is required to indicate approval of the treatment plan as stated above.  Please sign and either send electronically or make a copy of this report for your files and return this physician signed original.   Please mark one 1.__approve of plan  2. ___approve of plan with the following conditions.   ______________________________                                                          _____________________ Physician Signature                                                                                                             Date

## 2013-02-28 ENCOUNTER — Ambulatory Visit (HOSPITAL_COMMUNITY)
Admission: RE | Admit: 2013-02-28 | Discharge: 2013-02-28 | Disposition: A | Discharge status: Home / Self Care | Payer: Medicare Other | Source: Ambulatory Visit | Attending: Family Medicine | Admitting: Family Medicine

## 2013-02-28 NOTE — Progress Notes (Signed)
Physical Therapy Treatment Patient Details  Name: Steven [name] MRN: [id number] Date of Birth: 1940-06-21  Today's Date: 02/28/2013 Time: 1040-1150 PT Time Calculation (min): 70 min  Visit#: 2 of 8  Re-eval: 03/25/13 Authorization: medicare  Authorization Visit#: 2 of 8  Charges:  1040-1100 (20') self care, gait 1102-1128 (26'), therex 1130-1148 (18')  Subjective: Symptoms/Limitations Symptoms: Pt states he is doing his exercises given by therapist.  Pt is compliant with compression stockings and has no c/o pain today. Pain Assessment Currently in Pain?: No/denies   Exercise/Treatments Standing Gait Training: ambulation with RW 90' X 1, 100'X1 Other Standing Knee Exercises: hip abduction, extension bilaterally 10 reps each Seated Other Seated Knee Exercises: Rt ankle dorsiflexion/plantarflexion x 10 Other Seated Knee Exercises: sit to stand x 5 from 22" surface with B UE's     Physical Therapy Assessment and Plan PT Assessment and Plan Clinical Impression Statement: Rt heel wound overall healed, however inspected and placed a 2X2 over just in case still draining a little.  Wound inspected and dressed by Seth Bake, PTA.  Added standing exercises today.  Pt requires constant verbal and tactile cues to decrease forward flexion and isolate correct muscles.  Able to increase ambulation distance today.  Encouraged pt to walk iinto and iout of therapy appointements. PT Plan: PT will benefit from exercises that incorporate balance and strength.  Particular stressing glute and abductor strengthening.     Problem List Patient Active Problem List   Diagnosis Date Noted  . Poor balance 02/23/2013  . WEAKNESS 06/05/2008  . DYSPNEA ON EXERTION 11/15/2007  . CATARACT NOS 02/15/2007  . DIABETES MELLITUS, TYPE II, CONTROLLED, WITH COMPLICATIONS 08/24/2006  . RENAL FAILURE, ACUTE 07/30/2006  . HYPERLIPIDEMIA 04/26/2006  . ANEMIA-NOS 04/26/2006  . HYPERTENSION 04/26/2006  .  KIDNEY DISEASE, CHRONIC, STAGE III 04/26/2006  . CEREBROVASCULAR ACCIDENT, HX OF 04/26/2006     [name], PTA/CLT 02/28/2013, 12:07 PM

## 2013-03-02 ENCOUNTER — Ambulatory Visit (HOSPITAL_COMMUNITY)
Admission: RE | Admit: 2013-03-02 | Discharge: 2013-03-02 | Disposition: A | Discharge status: Home / Self Care | Payer: Medicare Other | Source: Ambulatory Visit | Attending: Family Medicine | Admitting: Family Medicine

## 2013-03-02 DIAGNOSIS — IMO0001 Reserved for inherently not codable concepts without codable children: Secondary | ICD-10-CM | POA: Insufficient documentation

## 2013-03-02 DIAGNOSIS — S81009A Unspecified open wound, unspecified knee, initial encounter: Secondary | ICD-10-CM | POA: Insufficient documentation

## 2013-03-02 DIAGNOSIS — M7989 Other specified soft tissue disorders: Secondary | ICD-10-CM | POA: Insufficient documentation

## 2013-03-02 NOTE — Progress Notes (Signed)
Physical Therapy Treatment Patient Details  Name: Steven [name] MRN: [id number] Date of Birth: 1941-02-14  Today's Date: 03/02/2013 Time: 1000-1055 PT Time Calculation (min): 55 min  Visit#: 3 of 8  Re-eval: 03/25/13 Authorization: medicare  Authorization Visit#: 3 of 8  Charges:  Gait 1000-1025 (25'), therex  1029-1055 (26')   Subjective: Symptoms/Limitations Symptoms: Pt reports mat exercises are difficult for him to complete at home.  Woundcare now being done 1X week. Pain Assessment Currently in Pain?: No/denies   Exercise/Treatments Standing Gait Training: ambulation with RW 90' X 1, 100'X1 Other Standing Knee Exercises: hip abduction, extension hold for now due to weakness and substitution Seated Other Seated Knee Exercises: Rt ankle dorsiflexion/plantarflexion x 10 Other Seated Knee Exercises: sit to stand x 10 from 21" surface with B UE's Supine Straight Leg Raises: Both;10 reps Other Supine Knee Exercises: hip abduction x 10 B  Other Supine Knee Exercises: heelslides x 10 B  Sidelying Clams: 10 reps Rt only Other Sidelying Knee Exercises: hamstring curls with board [name] 10 reps     Physical Therapy Assessment and Plan PT Assessment and Plan Clinical Impression Statement: Right heel wound not addressed today; decreased to 1X week.  Pt late for appt.  Focused session on increasing LE strength and improving gait quality.  Pt req cues to stand taller with gait, stay within walker BOS and increase step length/height.  Resumed mat exercises to increase LE strength.  Pt with difficulty with standing exercises due to extreme weakness and  unable to isolate correct mm.    Attempted to begin prone LE exercises, however too weak at this point.  Pt unable to perform hip abduction in SL position due to weakness.  encouraged pt to continue working on these exercises at home. PT Plan: PT will benefit from exercises that incorporate balance and strength ie sit to stand ,and  progression to standing hip exercises when able..  Particular stressing glute and abductor strengthening.l     Problem List Patient Active Problem List   Diagnosis Date Noted  . Poor balance 02/23/2013  . WEAKNESS 06/05/2008  . DYSPNEA ON EXERTION 11/15/2007  . CATARACT NOS 02/15/2007  . DIABETES MELLITUS, TYPE II, CONTROLLED, WITH COMPLICATIONS 08/24/2006  . RENAL FAILURE, ACUTE 07/30/2006  . HYPERLIPIDEMIA 04/26/2006  . ANEMIA-NOS 04/26/2006  . HYPERTENSION 04/26/2006  . KIDNEY DISEASE, CHRONIC, STAGE III 04/26/2006  . CEREBROVASCULAR ACCIDENT, HX OF 04/26/2006     [name], PTA/CLT  03/02/2013, 12:21 PM

## 2013-03-07 ENCOUNTER — Ambulatory Visit (HOSPITAL_COMMUNITY): Payer: Medicare Other | Admitting: *Deleted

## 2013-03-09 ENCOUNTER — Ambulatory Visit (HOSPITAL_COMMUNITY)
Admission: RE | Admit: 2013-03-09 | Discharge: 2013-03-09 | Disposition: A | Discharge status: Home / Self Care | Payer: Medicare Other | Source: Ambulatory Visit | Attending: Family Medicine | Admitting: Family Medicine

## 2013-03-09 NOTE — Progress Notes (Signed)
Physical Therapy Treatment Patient Details  Name: Altariq Goodall MRN: [id number] Date of Birth: [birth year]  Today's Date: 03/09/2013 Time: 0950-1055 PT Time Calculation (min): 65 min Charges: Selective debridement (= or < 20 cm) (4098-1191) Gait x 10' (1018-1028) Therex x 25' (1030-1055)   Visit#: 4 of 8  Re-eval: 03/25/13  Authorization: medicare  Authorization Visit#: 4 of 8   Subjective: Symptoms/Limitations Symptoms: Pt states that he is walking more at home. Pain Assessment Currently in Pain?: No/denies   03/09/13 1206  Wound  Date First Assessed/Time First Assessed: 10/05/12 0945   Wound Type: Other (Comment)  Location: Heel  Location Orientation: Right  Wound Description (Comments): stage 2 pressure ulcer  Site / Wound Assessment Red;Yellow  % Wound base Red or Granulating 100%  % Wound base Yellow 0%  % Wound base Black 0%  Peri-wound Assessment Edema  Drainage Description Serosanguineous;No odor  Treatment Cleansed;Debridement (Selective)  Dressing Type Impregnated gauze (bismuth);Compression wrap  Dressing Changed Changed  Dressing Status Clean;Dry;Intact  Selective Debridement  Selective Debridement - Location Callous removed on right heel  Selective Debridement - Tools Used Forceps;Scissors   Exercise/Treatments Standing Gait Training: Gait training with RW x 124' Seated Other Seated Knee Exercises: sit to stand x 10 from 21" surface with B UE's Supine Other Supine Knee Exercises: hip abduction x 10 left  Other Supine Knee Exercises: heelslides x 10 left  Sidelying Hip ABduction: 10 reps;Limitations  Physical Therapy Assessment and Plan PT Assessment and Plan Clinical Impression Statement: Callous removed from right heel. Wound is not yet healed but is 100% granulation. Pt able ambulates with rolling walker for 124' with CGA and rolling walker. Pt requires multimodal cueing to improve posture with gait training. PT is able to roll to right side lying  from supine with minimal difficulty but requires moderate assistance to go to left side lying from supine. PT Plan: Continue to progress LE strengthening and gait mechanics per PT POC.      Problem List Patient Active Problem List   Diagnosis Date Noted  . Poor balance 02/23/2013  . WEAKNESS 06/05/2008  . DYSPNEA ON EXERTION 11/15/2007  . CATARACT NOS 02/15/2007  . DIABETES MELLITUS, TYPE II, CONTROLLED, WITH COMPLICATIONS 08/24/2006  . RENAL FAILURE, ACUTE 07/30/2006  . HYPERLIPIDEMIA 04/26/2006  . ANEMIA-NOS 04/26/2006  . HYPERTENSION 04/26/2006  . KIDNEY DISEASE, CHRONIC, STAGE III 04/26/2006  . CEREBROVASCULAR ACCIDENT, HX OF 04/26/2006    PT - End of Session Equipment Utilized During Treatment: Gait belt Activity Tolerance: Patient tolerated treatment well General Behavior During Therapy: [facility] for tasks assessed/performed  Seth Bake, PTA  03/09/2013, 12:11 PM

## 2013-03-14 ENCOUNTER — Ambulatory Visit (HOSPITAL_COMMUNITY)
Admission: RE | Admit: 2013-03-14 | Discharge: 2013-03-14 | Disposition: A | Discharge status: Home / Self Care | Payer: Medicare Other | Source: Ambulatory Visit | Attending: Family Medicine | Admitting: Family Medicine

## 2013-03-14 NOTE — Progress Notes (Signed)
Physical Therapy Treatment Patient Details  Name: Becky Berberian MRN: [id number] Date of Birth: 1940/06/05  Today's Date: 03/14/2013 Time: 1105-1200 PT Time Calculation (min): 55 min Visit#: 5 of 8  Re-eval: 03/25/13 Authorization: medicare  Authorization Visit#: 5 of 8  Charges:  therex 38', gait 8' (before therex)  Subjective: Symptoms/Limitations Symptoms: Pt states he is doing well and continues to walk at home, do what exercises he can. Pain Assessment Currently in Pain?: No/denies   Exercise/Treatments Stretches Passive Hamstring Stretch: 2 reps;30 seconds;Limitations Passive Hamstring Stretch Limitations: Rt  Standing Gait Training: Gait training with RW x 100' for warm up/tolerance Seated Other Seated Knee Exercises: sit to stand x 10 from 21" surface with B UE's Supine Straight Leg Raises: Both;10 reps Other Supine Knee Exercises: hip abduction x 10 left  Other Supine Knee Exercises: heelslides x 10 left  Sidelying Hip ABduction: 10 reps;Limitations Clams: 10 reps Rt only Other Sidelying Knee Exercises: hamstring curls with board [name] 10 reps     Physical Therapy Assessment and Plan PT Assessment and Plan Clinical Impression Statement: Focused session again on increasing LE strength while improving posture and increasing flexibility of trunk.  Overall improving strength in Bilateral hamstrings, requiring less assistance to perform contraction and improving ROM in Rt knee.  continues to require max assist with hip abduction in SL position.  PT Plan: Continue to progress LE strengthening and gait mechanics per PT POC. Woundcare 1X week (Thursdays).     Problem List Patient Active Problem List   Diagnosis Date Noted  . Poor balance 02/23/2013  . WEAKNESS 06/05/2008  . DYSPNEA ON EXERTION 11/15/2007  . CATARACT NOS 02/15/2007  . DIABETES MELLITUS, TYPE II, CONTROLLED, WITH COMPLICATIONS 08/24/2006  . RENAL FAILURE, ACUTE 07/30/2006  . HYPERLIPIDEMIA  04/26/2006  . ANEMIA-NOS 04/26/2006  . HYPERTENSION 04/26/2006  . KIDNEY DISEASE, CHRONIC, STAGE III 04/26/2006  . CEREBROVASCULAR ACCIDENT, HX OF 04/26/2006    PT - End of Session Equipment Utilized During Treatment: Gait belt Activity Tolerance: Patient tolerated treatment well General Behavior During Therapy: [facility] for tasks assessed/performed  GP Functional Assessment Tool Used: Clinical judgement  [name], PTA/CLT 03/14/2013, 12:33 PM

## 2013-03-16 ENCOUNTER — Inpatient Hospital Stay (HOSPITAL_COMMUNITY): Admission: RE | Admit: 2013-03-16 | Payer: Medicare Other | Source: Ambulatory Visit | Admitting: Physical Therapy

## 2013-03-16 ENCOUNTER — Telehealth (HOSPITAL_COMMUNITY): Payer: Self-pay

## 2013-03-21 ENCOUNTER — Ambulatory Visit (HOSPITAL_COMMUNITY)
Admission: RE | Admit: 2013-03-21 | Discharge: 2013-03-21 | Disposition: A | Discharge status: Home / Self Care | Payer: Medicare Other | Source: Ambulatory Visit | Attending: Family Medicine | Admitting: Family Medicine

## 2013-03-21 NOTE — Progress Notes (Signed)
Physical Therapy Treatment Patient Details  Name: Steven [name] MRN: [id number] Date of Birth: 1940/09/05  Today's Date: 03/21/2013 Time: 0945-1100 PT Time Calculation (min): 75 min  Visit#: 6 of 8  Re-eval: 03/25/13 Authorization: medicare  Authorization Visit#: 6 of 8  Charges:  Deb <20cm [phone] (25'), therex 1015-1100 (45')  Subjective: Pt states he had stomach issues last visit and was unable to keep his appointment.   Exercise/Treatments Stretches Passive Hamstring Stretch: 2 reps;30 seconds;Limitations Passive Hamstring Stretch Limitations: Rt  Aerobic Stationary Bike: nustep 8', hills #2, level 2  Standing Gait Training: Gait training with RW x 75' for warm up/tolerance Seated Other Seated Knee Exercises: sit to stand x 5 from 21" surface with B UE's without walker, LUE HHA Supine Straight Leg Raises: Both;2 sets;10 reps Other Supine Knee Exercises: hip abduction 2 sets x 10 bilaterally  Other Supine Knee Exercises: heelslides 2 sets x 10 left      03/09/13 1206   Wound   Date First Assessed/Time First Assessed: 10/05/12 0945 Wound Type: Other (Comment) Location: Heel Location Orientation: Right Wound Description (Comments): stage 2 pressure ulcer   Site / Wound Assessment  Red;Yellow   % Wound base Red or Granulating  100%   % Wound base Yellow  0%   % Wound base Black  0%   Peri-wound Assessment  Edema   Drainage Description  Serosanguineous;No odor   Treatment  Cleansed;Debridement (Selective)   Dressing Type  Impregnated gauze (bismuth);Compression wrap   Dressing Changed  Changed   Dressing Status  Clean;Dry;Intact   Selective Debridement   Selective Debridement - Location  Callous removed on right heel   Selective Debridement - Tools Used  Forceps;Scissors         Physical Therapy Assessment and Plan PT Assessment and Plan Clinical Impression Statement: Wound still not closed with minimal drainage Rt heel.  Debrided callous from perimeter to  promote approximation of wound.  Continued dressing with xeroform due to dryness. Increased to 2 sets of mat activitty today.  Pt unable to complete all mat exercises due to having to urinate.   Began nustep at end of session today for activity tolerance and increasing Rt knee ROM. PT Plan: Continue to progress LE strengthening and gait mechanics per PT POC. Add bridging next visit with Woundcare 1X week (Tues this week due to missing last thursday's appt.).  Re-eval next visit (due 10/25)    Goals    Problem List Patient Active Problem List   Diagnosis Date Noted  . Poor balance 02/23/2013  . WEAKNESS 06/05/2008  . DYSPNEA ON EXERTION 11/15/2007  . CATARACT NOS 02/15/2007  . DIABETES MELLITUS, TYPE II, CONTROLLED, WITH COMPLICATIONS 08/24/2006  . RENAL FAILURE, ACUTE 07/30/2006  . HYPERLIPIDEMIA 04/26/2006  . ANEMIA-NOS 04/26/2006  . HYPERTENSION 04/26/2006  . KIDNEY DISEASE, CHRONIC, STAGE III 04/26/2006  . CEREBROVASCULAR ACCIDENT, HX OF 04/26/2006    PT - End of Session Equipment Utilized During Treatment: Gait belt Activity Tolerance: Patient tolerated treatment well General Behavior During Therapy: [facility] for tasks assessed/performed  GP Functional Assessment Tool Used: Clinical judgement  [name], PTA/CLT 03/21/2013, 11:57 AM

## 2013-03-23 ENCOUNTER — Ambulatory Visit (HOSPITAL_COMMUNITY)
Admission: RE | Admit: 2013-03-23 | Discharge: 2013-03-23 | Disposition: A | Discharge status: Home / Self Care | Payer: Medicare Other | Source: Ambulatory Visit | Attending: Family Medicine | Admitting: Family Medicine

## 2013-03-23 NOTE — Progress Notes (Signed)
Physical Therapy Re-evaluation (bilateral LE weakness and Rt Heel wound)  Patient Details  Name: Steven [name] MRN: [id number] Date of Birth: 1941/06/01  Today's Date: 03/23/2013 Time: 3244-0102 PT Time Calculation (min): 65 min       Visit#: 7 of 16  Re-eval: 04/20/13 Diagnosis: difficulty walking and Rt heel wound Authorization: medicare    Authorization Visit#: 7 of 16   Subjective Symptoms/Limitations Symptoms: Pt states he intends to make an appointment with his prosthetist regarding his prosthesis and footwear.  States he is overall doing good with improving strength and activity tolerance.   Objective: Observations: pt bent at 25 degrees flexion (was 35 degrees) due to hip flexion contracture.    RLE AROM (degrees) Right Knee Flexion: 60 Right Ankle Dorsiflexion: -12  RLE PROM (degrees) Right Knee Flexion: 84 Right Ankle Dorsiflexion: 0  RLE Strength Right Hip Flexion: 3+/5 (was 3-/5) Right Hip Extension: 2-/5 (was 2-/5) Right Hip ABduction: 2/5 (was 2-/5) Right Knee Flexion: 2/5 (was 2/5) Right Knee Extension: 5/5 (was 4+/5) Right Ankle Dorsiflexion: 3-/5 (was 3-/5) Right Ankle Plantar Flexion: 3/5 (was 2-/5)  LLE Strength Left Hip Flexion: 4/5 (was 2+/5) Left Hip Extension: 2-/5 (was 2-/5) Left Hip ABduction: 2+/5 (was 2/5) Left Knee Flexion: 2/5 (was 2/5) Left Knee Extension: 5/5 (was 5/5)  Exercise/Treatments Mobility/Balance  Transfers Stand to Sit: 4: Min guard Ambulation/Gait Ambulation/Gait: Yes Ambulation/Gait Assistance: 7: Independent Ambulation Distance (Feet): 150 Feet Assistive device: Rolling walker    Therapeutic Exercises: Aerobic Stationary Bike: nustep 8', hills #2, level 2  Standing Gait Training: Gait training with RW x 100X2' for warm up/tolerance Supine Bridges: 10 reps;Limitations Bridges Limitations: hold [name] into flexion Sidelying Hip ABduction: 10 reps;Limitations Hip ABduction Limitations: [name] only     Wound Therapy Wound 10/05/12 Other (Comment) Heel Right stage 2 pressure ulcer (Active)  Site / Wound Assessment Red;Yellow 03/23/2013 10:00 AM  % Wound base Red or Granulating 100% 03/23/2013 10:00 AM  % Wound base Yellow 0% 03/23/2013 10:00 AM  % Wound base Black 0% 03/23/2013 10:00 AM  % Wound base Other (Comment) 0% 01/17/2013 11:57 AM  Peri-wound Assessment Edema 03/23/2013 10:00 AM  Wound Length (cm) 0.3 cm (was 0.5 cm) 03/23/2013 10:00 AM  Wound Width (cm) 0.5 cm (was 0.8 cm) 03/23/2013 10:00 AM  Wound Depth (cm) 0.2 cm (was 0.3 cm) 03/23/2013 10:00 AM  Undermining (cm) None 12/08/2012  1:56 PM  Closure None 12/06/2012 12:06 PM  Drainage Amount Minimal 01/05/2013 11:33 AM  Drainage Description Serosanguineous;No odor 03/23/2013 10:00 AM  Non-staged Wound Description Partial thickness 01/05/2013 11:33 AM  Treatment Cleansed;Debridement (Selective) 03/21/2013 10:00 AM  Dressing Type Compression wrap;Impregnated gauze (bismuth) 03/21/2013 10:00 AM  Dressing Changed Changed 03/21/2013 10:00 AM  Dressing Status Clean;Dry;Intact 03/21/2013 10:00 AM       Physical Therapy Assessment and Plan PT Assessment and Plan Clinical Impression Statement: e-evalulation completed today for wound and LE strengthening.  Rt heel Wound with overall decrease in size and 100% granulated.  Continues to have callous formation perimeter of wound which requires skilled debridement.  Woundcare is now decreased to 1X week with patient compliance wearing compression garments on [name].  Bilateral LE strength has increased with exception of hamstrings and hip extensors.   Hip flexor contracture improved by 10 degrees, however with noted tightness in Rt knee flexion  and ankle dorsiflexion.  Pt would benefit from continued skilled therapy 2X week for 4 more weeks to improve [name] ROM and Bilateral LE strength.  Pt will continue to require 1X week woundcare for Rt heel.  PT Frequency: Min 2X/week PT Duration: 4 weeks PT  Plan: Continue to progress LE strengthening (focus on posterior LE mm) and gait mechanics 2X week.  Begin AROM/PROM for Rt knee and ankle.  continue woundcare 1X week.     Wound Therapy Goals Improve the function of patient's integumentary system by progressing the wound(s) through the phases of wound healing by: 1. Decrease Necrotic Tissue to: 0 - Progress: Progressing toward goal 2. Increase Granulation Tissue to: 100 - Progress: Progressing toward goal 3.  Decrease Length/Width/Depth by (cm): heel wound healed- Progress: Progressing toward goal 4.  Improve Drainage Characteristics: Min - Progress: Met 5.  Patient/Family will be able to : verbalize the importance of wearing compression hose - Progress: Met 6.  Lymph fluid to be decreased by 50%- Progress: Met Goals/treatment plan/discharge plan were made with and agreed upon by patient/family: Yes Time For Goal Achievement:  (4 weeks) Wound Therapy - Potential for Goals: Good  Mobility Goals Home Exercise Program Pt/caregiver will Perform Home Exercise Program: For increased strengthening PT Goal: Perform Home Exercise Program - Progress: Progressing toward goal  PT Short Term Goals Time to Complete Short Term Goals: 2 weeks PT Short Term Goal 1: Pt to be able to come sit to stand from a standard chair height with mod.-max  assist  of one - Progress: Met PT Short Term Goal 2: Pt able to transfer with mod assist of one.- Progress: Met PT Short Term Goal 3: Pt to be able to stand without UE support x one minute.- Progress: Progressing toward goal (25 seconds max)  PT Long Term Goals Time to Complete Long Term Goals: 4 weeks PT Long Term Goal 1: I in advance HEP- Progress: Progressing toward goal PT Long Term Goal 2: Pt able to come sit to stand from a standard chair height with min-mod assist of one- Progress: Met Long Term Goal 3: Pt able to stand without UE support x 2 minutes-Progress: Progressing toward goal Long Term Goal 4: Pt to  be able to stand upright with 20 degree hip flexion -Progress: Progressing toward goal PT Long Term Goal 5: Pt to be able to walk with walker for 200 feet- Progressing toward goal  Problem List Patient Active Problem List   Diagnosis Date Noted  . Poor balance 02/23/2013  . WEAKNESS 06/05/2008  . DYSPNEA ON EXERTION 11/15/2007  . CATARACT NOS 02/15/2007  . DIABETES MELLITUS, TYPE II, CONTROLLED, WITH COMPLICATIONS 08/24/2006  . RENAL FAILURE, ACUTE 07/30/2006  . HYPERLIPIDEMIA 04/26/2006  . ANEMIA-NOS 04/26/2006  . HYPERTENSION 04/26/2006  . KIDNEY DISEASE, CHRONIC, STAGE III 04/26/2006  . CEREBROVASCULAR ACCIDENT, HX OF 04/26/2006      GP Functional Assessment Tool Used: Clinical judgement Functional Limitation: Changing and maintaining body position Changing and Maintaining Body Position Current Status (Z6109): At least 60 percent but less than 80 percent impaired, limited or restricted Changing and Maintaining Body Position Goal Status (U0454): At least 60 percent but less than 80 percent impaired, limited or restricted  [name], PTA/CLT 03/23/2013, 11:11 AM

## 2013-03-28 ENCOUNTER — Ambulatory Visit (HOSPITAL_COMMUNITY): Payer: Medicare Other | Admitting: *Deleted

## 2013-03-30 ENCOUNTER — Ambulatory Visit (HOSPITAL_COMMUNITY)
Admission: RE | Admit: 2013-03-30 | Discharge: 2013-03-30 | Disposition: A | Discharge status: Home / Self Care | Payer: Medicare Other | Source: Ambulatory Visit | Attending: Family Medicine | Admitting: Family Medicine

## 2013-03-30 NOTE — Progress Notes (Signed)
Physical Therapy Treatment Patient Details  Name: Holton Sidman MRN: [id number] Date of Birth: [birth year]  Today's Date: 03/30/2013 Time: 0940-1105 PT Time Calculation (min): 85 min Charges:  Self-care x 40'(0940-1020) Gait x 10'(1022-1032) Therex x 23'(1035-1058)   Visit#: 8 of 16  Re-eval: 04/20/13  Authorization: medicare  Authorization Visit#: 8 of 16   Subjective: Symptoms/Limitations Symptoms: Pt states that he has been doing squats at the [employer]. Pain Assessment Currently in Pain?: No/denies   03/30/13 1206  Wound  Date First Assessed/Time First Assessed: 10/05/12 0945   Wound Type: Other (Comment)  Location: Heel  Location Orientation: Right  Wound Description (Comments): stage 2 pressure ulcer  Site / Wound Assessment Red;Yellow  % Wound base Red or Granulating 100%  % Wound base Yellow 0%  % Wound base Black 0%  Peri-wound Assessment Edema  Drainage Description Serosanguineous;No odor  Dressing Type Compression wrap;Impregnated gauze (bismuth)  Selective Debridement  Selective Debridement - Location None needed   Exercise/Treatments Aerobic Stationary Bike: nustep 10', hills #3, level 2  Standing Gait Training: Gait training with RW in dept with cueing to improve posture, hip/knee flexion, and heel strike. Seated Other Seated Knee Exercises: sit to stand x 5 from 21" surface with B UE's without walker, LUE HHA  Physical Therapy Assessment and Plan PT Assessment and Plan Clinical Impression Statement: Heel wound is progressing well. Wound appears healed but tissue is friable. Wound was dressed with xeroform and ABD pad. Right lower leg was cleansed and exfoliated with 4x4 and Carra Klenz. Tissue under dry skin also appears friable. Xeroform was applied to these areas and leg was wrapped with kerlex. Juxtatifit applied over dressings. Completed wit to stand this session focusing on avoiding using hands on walker to assume erect posture. Pt requires  frequent vc's to avoid moving legs with UEs due to weakness. Ended session on NuStep to improve LE strength and activity tolerance. PT Plan: Continue to progress LE strengthening and gait mechanics per PT POC. Wound care 1x per week.     Problem List Patient Active Problem List   Diagnosis Date Noted  . Poor balance 02/23/2013  . WEAKNESS 06/05/2008  . DYSPNEA ON EXERTION 11/15/2007  . CATARACT NOS 02/15/2007  . DIABETES MELLITUS, TYPE II, CONTROLLED, WITH COMPLICATIONS 08/24/2006  . RENAL FAILURE, ACUTE 07/30/2006  . HYPERLIPIDEMIA 04/26/2006  . ANEMIA-NOS 04/26/2006  . HYPERTENSION 04/26/2006  . KIDNEY DISEASE, CHRONIC, STAGE III 04/26/2006  . CEREBROVASCULAR ACCIDENT, HX OF 04/26/2006    PT - End of Session Equipment Utilized During Treatment: Gait belt Activity Tolerance: Patient tolerated treatment well General Behavior During Therapy: [facility] for tasks assessed/performed  Seth Bake, PTA  03/30/2013, 12:09 PM

## 2013-04-04 ENCOUNTER — Ambulatory Visit (HOSPITAL_COMMUNITY)
Admission: RE | Admit: 2013-04-04 | Discharge: 2013-04-04 | Disposition: A | Discharge status: Home / Self Care | Payer: Medicare Other | Source: Ambulatory Visit | Attending: Family Medicine | Admitting: Family Medicine

## 2013-04-04 DIAGNOSIS — IMO0001 Reserved for inherently not codable concepts without codable children: Secondary | ICD-10-CM | POA: Insufficient documentation

## 2013-04-04 DIAGNOSIS — M7989 Other specified soft tissue disorders: Secondary | ICD-10-CM | POA: Insufficient documentation

## 2013-04-04 DIAGNOSIS — S81009A Unspecified open wound, unspecified knee, initial encounter: Secondary | ICD-10-CM | POA: Insufficient documentation

## 2013-04-04 NOTE — Progress Notes (Signed)
Physical Therapy Treatment Patient Details  Name: Advith Martine MRN: [id number] Date of Birth: [birth year]  Today's Date: 04/04/2013 Time: 1030-1130 PT Time Calculation (min): 60 min  Visit#: 9 of 16  Re-eval: 04/20/13 Authorization: medicare  Authorization Visit#: 9 of 16  Charges:  therex 1030-1105 (35'), gait 1107-1124 (15')  Subjective: Symptoms/Limitations Symptoms: Pt states he has not seen the prosthesist yet but plans to go as soon as his daughter can take him.   Pain Assessment Currently in Pain?: No/denies   Exercise/Treatments Aerobic Stationary Bike: nustep 10', hills #3, level 3  Standing Gait Training: gait 300 feet in 15 minutes Seated Other Seated Knee Exercises: L UE HHA standing 2 minutes without AD Other Seated Knee Exercises: sit to stand x 5 from 21" surface with B UE's without walker, LUE HHA Supine Bridges: 2 sets;10 reps;Limitations Bridges Limitations: hold [name] into flexion Sidelying Hip ABduction: 2 sets;10 reps Hip ABduction Limitations: [name] only Clams: 10 reps Rt only Other Sidelying Knee Exercises: Passive quad stretch to [name] 2X45"     Physical Therapy Assessment and Plan PT Assessment and Plan Clinical Impression Statement: Focused session on increasing ambulation/activity tolerance and ability to stand with decreased UE assistance.  Pt with increased stability and strength to move LE's without UE assistance.  Added passive Rt quad stretch in Lt side lying position due to extremely tight quadricep mm.  Pt with overall increasing activity tolerance. PT Plan: Continue to progress LE strengthening and gait mechanics per PT POC. Wound care 1x per week (Thursday)     Problem List Patient Active Problem List   Diagnosis Date Noted  . Poor balance 02/23/2013  . WEAKNESS 06/05/2008  . DYSPNEA ON EXERTION 11/15/2007  . CATARACT NOS 02/15/2007  . DIABETES MELLITUS, TYPE II, CONTROLLED, WITH COMPLICATIONS 08/24/2006  . RENAL FAILURE,  ACUTE 07/30/2006  . HYPERLIPIDEMIA 04/26/2006  . ANEMIA-NOS 04/26/2006  . HYPERTENSION 04/26/2006  . KIDNEY DISEASE, CHRONIC, STAGE III 04/26/2006  . CEREBROVASCULAR ACCIDENT, HX OF 04/26/2006    General Behavior During Therapy: [facility] for tasks assessed/performed   [name], PTA/CLT 04/04/2013, 11:58 AM

## 2013-04-06 ENCOUNTER — Ambulatory Visit (HOSPITAL_COMMUNITY)
Admission: RE | Admit: 2013-04-06 | Discharge: 2013-04-06 | Disposition: A | Discharge status: Home / Self Care | Payer: Medicare Other | Source: Ambulatory Visit | Attending: Family Medicine | Admitting: Family Medicine

## 2013-04-06 NOTE — Progress Notes (Signed)
Physical Therapy Treatment Patient Details  Name: Steven [name] MRN: [id number] Date of Birth: 1940-12-21  Today's Date: 04/06/2013 Time: 4098-1191 PT Time Calculation (min): 57 min  Visit#: 10 of 16  Re-eval: 04/20/13 Authorization: medicare  Authorization Visit#: 10 of 16  Charges:  Deb <20cm 1118-1140, gait 1140-1155 15', therex 1200-1210 10'  Subjective: Symptoms/Limitations Symptoms: Pt states he is doing well.  States he exericses in the morning and walks everyday. Pain Assessment Currently in Pain?: No/denies   Exercise/Treatments Aerobic Stationary Bike: nustep 10', hills #3, level 3  Standing Gait Training: gait 225 feet in 12 minutes   Wound Therapy Wound 10/05/12 Other (Comment) Heel Right stage 2 pressure ulcer (Active)  Site / Wound Assessment Red;Yellow 04/06/2013 12:00 PM  % Wound base Red or Granulating 100% 04/06/2013 12:00 PM  % Wound base Yellow 0% 04/06/2013 12:00 PM  % Wound base Black 0% 04/06/2013 12:00 PM  % Wound base Other (Comment) 0% 01/17/2013 11:57 AM  Peri-wound Assessment Edema 04/06/2013 12:00 PM  Wound Length (cm) 0.3 cm 03/23/2013 10:00 AM  Wound Width (cm) 0.5 cm 03/23/2013 10:00 AM  Wound Depth (cm) 0.2 cm 03/23/2013 10:00 AM  Undermining (cm) undermining from 4 0'clock to 10 o'clock approximately 1 cm  12/08/2012  1:56 PM  Closure None 12/06/2012 12:06 PM  Drainage Amount Minimal 01/05/2013 11:33 AM  Drainage Description Serosanguineous;No odor 04/06/2013 12:00 PM  Non-staged Wound Description Partial thickness 01/05/2013 11:33 AM  Treatment Cleansed;Debridement (Selective) 04/06/2013 12:00 PM  Dressing Type Compression wrap;Impregnated gauze (bismuth) 04/06/2013 12:00 PM  Dressing Changed Changed 04/06/2013 12:00 PM  Dressing Status Clean;Dry;Intact 04/06/2013 12:00 PM   Selective Debridement Selective Debridement - Location: heel Selective Debridement - Tools Used: Forceps Selective Debridement - Tissue Removed: callous/perimeter of wound     Physical Therapy Assessment and Plan PT Assessment and Plan PT Assessment:  [name] dry/scaly.  Wound on heel continues to decrease in sized, however callous at perimeter preventing full approximation.  Debrided away at callous.  Cleansed entire [name] and applied lotion prior to bandaging/dressing change.  Unable to complete all exercises today due to time constraints.  PT Plan: Continue to progress LE strengthening and gait mechanics per PT POC. Wound care 1x per week (Thursday)     Problem List Patient Active Problem List   Diagnosis Date Noted  . Poor balance 02/23/2013  . WEAKNESS 06/05/2008  . DYSPNEA ON EXERTION 11/15/2007  . CATARACT NOS 02/15/2007  . DIABETES MELLITUS, TYPE II, CONTROLLED, WITH COMPLICATIONS 08/24/2006  . RENAL FAILURE, ACUTE 07/30/2006  . HYPERLIPIDEMIA 04/26/2006  . ANEMIA-NOS 04/26/2006  . HYPERTENSION 04/26/2006  . KIDNEY DISEASE, CHRONIC, STAGE III 04/26/2006  . CEREBROVASCULAR ACCIDENT, HX OF 04/26/2006    General Behavior During Therapy: [facility] for tasks assessed/performed    [name], PTA/CLT 04/06/2013, 12:25 PM

## 2013-04-11 ENCOUNTER — Telehealth (HOSPITAL_COMMUNITY): Payer: Self-pay

## 2013-04-11 ENCOUNTER — Ambulatory Visit (HOSPITAL_COMMUNITY): Payer: Medicare Other | Admitting: *Deleted

## 2013-04-13 ENCOUNTER — Ambulatory Visit (HOSPITAL_COMMUNITY)
Admission: RE | Admit: 2013-04-13 | Discharge: 2013-04-13 | Disposition: A | Discharge status: Home / Self Care | Payer: Medicare Other | Source: Ambulatory Visit | Attending: Family Medicine | Admitting: Family Medicine

## 2013-04-13 NOTE — Progress Notes (Signed)
Physical Therapy Treatment Patient Details  Name: Steven [name] MRN: [id number] Date of Birth: [birth year]  Today's Date: 04/13/2013 Time: 1010-1048 PT Time Calculation (min): 38 min  Visit#: 11 of 16  Re-eval: 04/20/13 Authorization: medicare  Authorization Visit#: 11 of 16  Charges:  Gait 1010-1025 (15'), therex 1030-1045 (15')  Subjective: Symptoms/Limitations Symptoms: Pt reports he forgot his wraps for his LE's today but will bring them next visit for wound care.  Pt states he's not feeling his best today. Pain Assessment Currently in Pain?: No/denies   Exercise/Treatments Aerobic Stationary Bike: nustep 15', hills #2, level 2  Standing Gait Training: gait 225 feet in 15 minutes     Physical Therapy Assessment and Plan PT Assessment and Plan Clinical Impression Statement: Pt able to complete ambulation and gait today then reported he was extremely fatigued requesting not to complete mat activities today.  Pt reported he would do them at home later this evening.  Unable to complete wound care this session but will complete next visit.  PT Plan: Continue to progress LE strengthening and gait mechanics per PT POC. Wound care 1x per week (Tuesday).     Problem List Patient Active Problem List   Diagnosis Date Noted  . Poor balance 02/23/2013  . WEAKNESS 06/05/2008  . DYSPNEA ON EXERTION 11/15/2007  . CATARACT NOS 02/15/2007  . DIABETES MELLITUS, TYPE II, CONTROLLED, WITH COMPLICATIONS 08/24/2006  . RENAL FAILURE, ACUTE 07/30/2006  . HYPERLIPIDEMIA 04/26/2006  . ANEMIA-NOS 04/26/2006  . HYPERTENSION 04/26/2006  . KIDNEY DISEASE, CHRONIC, STAGE III 04/26/2006  . CEREBROVASCULAR ACCIDENT, HX OF 04/26/2006    General Behavior During Therapy: [facility] for tasks assessed/performed  GP Functional Assessment Tool Used: Clinical judgement  [name], PTA/CLT 04/13/2013, 12:00 PM

## 2013-04-18 ENCOUNTER — Ambulatory Visit (HOSPITAL_COMMUNITY)
Admission: RE | Admit: 2013-04-18 | Discharge: 2013-04-18 | Disposition: A | Discharge status: Home / Self Care | Payer: Medicare Other | Source: Ambulatory Visit | Attending: Family Medicine | Admitting: Family Medicine

## 2013-04-18 NOTE — Progress Notes (Signed)
Physical Therapy - Wound Therapy  Treatment   Patient Details  Name: Steven [name] MRN: [id number] Date of Birth: Jul 22, 1940  Today's Date: 04/18/2013 Time: 8119-1478 Time Calculation (min): 47 min Visit#: 12 of 16  Re-eval: 04/20/13 Charges:  Reece Levy < 20cm  Subjective No pain or difficulties reported.  Pt ambulated back into wound room today using walker   Wound Therapy    Wound 10/05/12 Other (Comment) Heel Right stage 2 pressure ulcer (Active)  Site / Wound Assessment Red;Yellow 04/18/2013 11:00 AM  % Wound base Red or Granulating 100% 04/18/2013 11:00 AM  % Wound base Yellow 0% 04/18/2013 11:00 AM  % Wound base Black 0% 04/18/2013 11:00 AM  % Wound base Other (Comment) 0% 01/17/2013 11:57 AM  Peri-wound Assessment Edema 04/18/2013 11:00 AM  Wound Length (cm) 0.3 cm 03/23/2013 10:00 AM  Wound Width (cm) 0.5 cm 03/23/2013 10:00 AM  Wound Depth (cm) 0.2 cm 03/23/2013 10:00 AM  Undermining (cm) None 11/18/ 2014  1:56 PM  Closure approximating 12/06/2012 12:06 PM  Drainage Amount scant 01/05/2013 11:33 AM  Drainage Description Serosanguineous;No odor 04/18/2013 11:00 AM  Non-staged Wound Description Partial thickness 01/05/2013 11:33 AM  Treatment Cleansed;Debridement (Selective) 04/18/2013 11:00 AM  Dressing Type Compression wrap;Impregnated gauze (bismuth) 04/18/2013 11:00 AM  Dressing Changed Changed 04/18/2013 11:00 AM  Dressing Status Clean;Dry;Intact 04/18/2013 11:00 AM   Selective Debridement Selective Debridement - Location: heel Selective Debridement - Tools Used: Forceps;Scissors Selective Debridement - Tissue Removed: callous/perimeter of wound   Physical Therapy Assessment and Plan  PT Assessment:  Wound appears to be fully closed, however continues to be covered by thick callous.  Debrided callous away from periwound to allow full approximation.  May need to continue debriding callous to insure fully healed beneath.  Entire LE cleansed and applied vaseline gauze from  ankle to knee due to extreme dryness.  Lotion applied to foot prior to application of juxtafit.   PT Plan:  Continue to debride callous and insure full closure of wound beneath.  Anticipate discharge soon.   Problem List Patient Active Problem List   Diagnosis Date Noted  . Poor balance 02/23/2013  . WEAKNESS 06/05/2008  . DYSPNEA ON EXERTION 11/15/2007  . CATARACT NOS 02/15/2007  . DIABETES MELLITUS, TYPE II, CONTROLLED, WITH COMPLICATIONS 08/24/2006  . RENAL FAILURE, ACUTE 07/30/2006  . HYPERLIPIDEMIA 04/26/2006  . ANEMIA-NOS 04/26/2006  . HYPERTENSION 04/26/2006  . KIDNEY DISEASE, CHRONIC, STAGE III 04/26/2006  . CEREBROVASCULAR ACCIDENT, HX OF 04/26/2006     [name], PTA/CLT 04/18/2013, 11:27 AM

## 2013-04-20 ENCOUNTER — Ambulatory Visit (HOSPITAL_COMMUNITY)
Admission: RE | Admit: 2013-04-20 | Discharge: 2013-04-20 | Disposition: A | Discharge status: Home / Self Care | Payer: Medicare Other | Source: Ambulatory Visit | Attending: Family Medicine | Admitting: Family Medicine

## 2013-04-20 NOTE — Evaluation (Addendum)
Physical Therapy Re-evaluation  Patient Details  Name: Steven [name] MRN: [id number] Date of Birth: June 27, 1940  Today's Date: 04/20/2013 Time: 1191-4782 PT Time Calculation (min): 76 min Charges: Gait x 12'(0940-0952) MMT/ROMM x 9(5621-3086) Self care x 35' (1015-1050)              Visit#: 13 of 16  Re-eval: 04/20/13  Authorization: medicare    Authorization Visit#: 13 of 16   Past Medical History:  Past Medical History  Diagnosis Date  . Amputated below knee   . Coronary artery disease   . Diabetes mellitus   . Hypertension   . Hypercholesteremia    Past Surgical History:  Past Surgical History  Procedure Laterality Date  . Amputation      Subjective Symptoms/Limitations Symptoms: Pt states that he is walking more at home. Pain Assessment Currently in Pain?: No/denies  Assessment RLE AROM (degrees) Right Knee Extension: 0 (was 0 03/23/13) Right Knee Flexion: 80 (was 60 03/23/13) Right Ankle Dorsiflexion: -5 (was -12 03/23/13) RLE PROM (degrees) Right Knee Flexion: 90 (was 84 03/23/13) Right Ankle Dorsiflexion: 0 (was 0 03/23/13) RLE Strength Right Hip Flexion: 3+/5 (was 3+/5 03/23/13) Right Hip Extension: 2-/5 (was 2-/5 03/23/13) Right Hip ABduction: 2/5 (was 2/5 03/23/13) Right Knee Flexion: 3/5 (was 2/5 03/23/13) Right Knee Extension: 5/5 (was 5/5 03/23/13) Right Ankle Dorsiflexion: 3+/5 (was 3-/5 03/23/13) Right Ankle Plantar Flexion: 3/5 (was 3/5 03/23/13) LLE Strength Left Hip Flexion: 4/5 (was 4/5 03/23/13) Left Hip Extension: 2-/5 (was 2-/5 03/23/13) Left Hip ABduction: 2+/5 (was 2+/5 03/23/13) Left Knee Flexion: 3+/5 (was 2/5 03/23/13) Left Knee Extension: 5/5 (was 5/5 03/23/13)  Exercise/Treatments Mobility/Balance  Transfers Stand to Sit: 6: Modified independent (Device/Increase time) Ambulation/Gait Ambulation/Gait: Yes Ambulation/Gait Assistance: 7: Independent Ambulation Distance (Feet): 250 Feet (was 150 03/23/13) Assistive device:  Rolling walker   Physical Therapy Assessment and Plan PT Assessment and Plan Clinical Impression Statement: Pt displays improvements in strength, stability and gait. Heel wound is now completely healed. No drainage present. Pt would benefit from continuing skilled Pt to continue to improve functional strength and independence. PT Plan: Recommend to D/C wound care and continue skilled PT to improve strength and functional independence. Focus on improving sit to stand, bridging, prone hip extension as well as donning and doffing of compression wrap.   See 2x week for the next 2 weeks. Goals Home Exercise Program Pt/caregiver will Perform Home Exercise Program: For increased strengthening PT Short Term Goals Time to Complete Short Term Goals: 2 weeks PT Short Term Goal 1: Pt to be able to come sit to stand from a standard chair height with mod.-max  assist  of one PT Short Term Goal 1 - Progress: Met PT Short Term Goal 2: Pt able to transfer with mod assist of one. PT Short Term Goal 2 - Progress: Met PT Short Term Goal 3: Pt to be able to stand without UE support x one minute. PT Short Term Goal 3 - Progress: Met PT Long Term Goals Time to Complete Long Term Goals: 4 weeks PT Long Term Goal 1: I in advance HEP PT Long Term Goal 1 - Progress: Met PT Long Term Goal 2: Pt able to come sit to stand from a standard chair height with min-mod assist of one PT Long Term Goal 2 - Progress: Met Long Term Goal 3: Pt able to stand without UE support x 2 minutes Long Term Goal 3 Progress: Progressing toward goal Long Term Goal 4: Pt to be able to  stand upright with 20 degree hip flexion  (30 degrees currently) Long Term Goal 4 Progress: Progressing toward goal PT Long Term Goal 5: Pt to be able to walk with walker for 200 feet Long Term Goal 5 Progress: Met  Problem List Patient Active Problem List   Diagnosis Date Noted  . Poor balance 02/23/2013  . WEAKNESS 06/05/2008  . DYSPNEA ON EXERTION  11/15/2007  . CATARACT NOS 02/15/2007  . DIABETES MELLITUS, TYPE II, CONTROLLED, WITH COMPLICATIONS 08/24/2006  . RENAL FAILURE, ACUTE 07/30/2006  . HYPERLIPIDEMIA 04/26/2006  . ANEMIA-NOS 04/26/2006  . HYPERTENSION 04/26/2006  . KIDNEY DISEASE, CHRONIC, STAGE III 04/26/2006  . CEREBROVASCULAR ACCIDENT, HX OF 04/26/2006    PT - End of Session Activity Tolerance: Patient tolerated treatment well General Behavior During Therapy: [facility] for tasks assessed/performed  Seth Bake, PTA  04/20/2013, 11:51 AM  Physician Documentation Your signature is required to indicate approval of the treatment plan as stated above.  Please sign and either send electronically or make a copy of this report for your files and return this physician signed original.   Please mark one 1.__approve of plan  2. ___approve of plan with the following conditions.   ______________________________                                                          _____________________ Physician Signature                                                                                                             Date

## 2013-04-24 ENCOUNTER — Ambulatory Visit (HOSPITAL_COMMUNITY): Payer: Medicare Other | Admitting: *Deleted

## 2013-04-26 ENCOUNTER — Ambulatory Visit (HOSPITAL_COMMUNITY): Payer: Medicare Other | Admitting: *Deleted

## 2013-05-01 ENCOUNTER — Ambulatory Visit (HOSPITAL_COMMUNITY)
Admission: RE | Admit: 2013-05-01 | Discharge: 2013-05-01 | Disposition: A | Discharge status: Home / Self Care | Payer: Medicare Other | Source: Ambulatory Visit | Attending: Family Medicine | Admitting: Family Medicine

## 2013-05-01 DIAGNOSIS — S81009A Unspecified open wound, unspecified knee, initial encounter: Secondary | ICD-10-CM | POA: Insufficient documentation

## 2013-05-01 DIAGNOSIS — R2689 Other abnormalities of gait and mobility: Secondary | ICD-10-CM

## 2013-05-01 DIAGNOSIS — M7989 Other specified soft tissue disorders: Secondary | ICD-10-CM | POA: Insufficient documentation

## 2013-05-01 DIAGNOSIS — IMO0001 Reserved for inherently not codable concepts without codable children: Secondary | ICD-10-CM | POA: Insufficient documentation

## 2013-05-01 NOTE — Progress Notes (Signed)
Physical Therapy Treatment Patient Details  Name: Steven [name] MRN: [id number] Date of Birth: April 30, 1941  Today's Date: 05/01/2013 Time: 4098-1191 PT Time Calculation (min): 60 min Charge:  There activity 9945-1005; gt 1005-1021; self care [phone] Visit#: 14 of 17  Re-eval: 05/15/13    Authorization: medicare  Authorization Time Period:    Authorization Visit#: 14 of 17   Subjective: Symptoms/Limitations Symptoms: Pt states that he is walking more at home.     Exercise/Treatments   Standing Gait Training: gt training therapist facilitated keeping as tall as possible ,(therefore much slower) 40 ft x 2 Other Standing Knee Exercises: standing posture tolerance x 10 holding x 10-15 seconds each  Other Standing Knee Exercises: slow marching holding LE up x 3 seconds Seated Other Seated Knee Exercises: sit to stand x 5 from 21" surface with B UE's without walker, LUE HHAx 10 Supine Other Supine Knee Exercises: Long sit self care self lotioning  as well as attempting to don compression wrap     Physical Therapy Assessment and Plan PT Assessment and Plan Clinical Impression Statement: Pt able to come sit to stand with SBA now. Gt and standing activities therapist facilitated to improve pt posture when ambulating to reduce risk of falling.  Pt educated and worked on self donning of compression wrap which is impeded by OA of UE weakness of UE, lack of mobility of LE. Pt will benefit from skilled therapeutic intervention in order to improve on the following deficits: Decreased activity tolerance;Decreased strength;Difficulty walking;Decreased range of motion;Decreased balance PT Plan: continue to stress glut strength, posture and self lotioning of LE self donning of compression wrap    Goals    Problem List Patient Active Problem List   Diagnosis Date Noted  . Poor balance 02/23/2013  . WEAKNESS 06/05/2008  . DYSPNEA ON EXERTION 11/15/2007  . CATARACT NOS 02/15/2007  .  DIABETES MELLITUS, TYPE II, CONTROLLED, WITH COMPLICATIONS 08/24/2006  . RENAL FAILURE, ACUTE 07/30/2006  . HYPERLIPIDEMIA 04/26/2006  . ANEMIA-NOS 04/26/2006  . HYPERTENSION 04/26/2006  . KIDNEY DISEASE, CHRONIC, STAGE III 04/26/2006  . CEREBROVASCULAR ACCIDENT, HX OF 04/26/2006    PT - End of Session Activity Tolerance: Patient tolerated treatment well  GP    RUSSELL,CINDY 05/01/2013, 11:05 AM

## 2013-05-03 ENCOUNTER — Ambulatory Visit (HOSPITAL_COMMUNITY): Payer: Medicare Other | Admitting: Physical Therapy

## 2013-05-04 NOTE — Addendum Note (Signed)
Encounter addended by: [name], PTA on: 05/04/2013 12:29 PM<BR>     Documentation filed: Flowsheet VN, Clinical Notes

## 2013-05-04 NOTE — Addendum Note (Signed)
Encounter addended by: Harlon Ditty, PTA on: 05/04/2013  6:06 PM<BR>     Documentation filed: Clinical Notes

## 2013-05-09 ENCOUNTER — Ambulatory Visit (HOSPITAL_COMMUNITY)
Admission: RE | Admit: 2013-05-09 | Discharge: 2013-05-09 | Disposition: A | Discharge status: Home / Self Care | Payer: Medicare Other | Source: Ambulatory Visit | Attending: Family Medicine | Admitting: Family Medicine

## 2013-05-09 NOTE — Progress Notes (Signed)
Physical Therapy Treatment Patient Details  Name: Steven [name] MRN: [id number] Date of Birth: 10-26-1940  Today's Date: 05/09/2013 Time: 7829-5621 PT Time Calculation (min): 40 min Charges: Theract x 35'  Visit#: 15 of 17  Re-eval: 05/15/13  Authorization: Medicare  Authorization Visit#: 15 of 17   Subjective: Symptoms/Limitations Symptoms: Pt states that he has been laying on his stomach at home twice a day. Pain Assessment Currently in Pain?: No/denies  Exercise/Treatments Standing Other Standing Knee Exercises: Standing with correct posture to improve strength and standing tolerance Seated Other Seated Knee Exercises: Sit to stand avoiding using walker to stand errect to improve functional strength  Physical Therapy Assessment and Plan PT Assessment and Plan Clinical Impression Statement: Tx focus on improving functional straight and balance with sit to stand. Sit to stand completed 5 times then pt was encouraged to stand tall without using walker. Pt requires mod assist from therapist to reach standing position without walker. Pt tends to push straight up and rather than forward when coming from sit to stand. Pt did not bring other compression wrap therefor it was not changed this session. Instructed pt to bring next session. Pt will benefit from skilled therapeutic intervention in order to improve on the following deficits: Decreased activity tolerance;Decreased strength;Difficulty walking;Decreased range of motion;Decreased balance PT Plan: Continue to stress glute strength, posture and self lotioning of LE self donning of compression wrap.    Problem List Patient Active Problem List   Diagnosis Date Noted  . Poor balance 02/23/2013  . WEAKNESS 06/05/2008  . DYSPNEA ON EXERTION 11/15/2007  . CATARACT NOS 02/15/2007  . DIABETES MELLITUS, TYPE II, CONTROLLED, WITH COMPLICATIONS 08/24/2006  . RENAL FAILURE, ACUTE 07/30/2006  . HYPERLIPIDEMIA 04/26/2006  . ANEMIA-NOS  04/26/2006  . HYPERTENSION 04/26/2006  . KIDNEY DISEASE, CHRONIC, STAGE III 04/26/2006  . CEREBROVASCULAR ACCIDENT, HX OF 04/26/2006    PT - End of Session Activity Tolerance: Patient tolerated treatment well  Seth Bake, PTA 05/09/2013, 3:46 PM

## 2013-05-11 ENCOUNTER — Ambulatory Visit (HOSPITAL_COMMUNITY)
Admission: RE | Admit: 2013-05-11 | Discharge: 2013-05-11 | Disposition: A | Discharge status: Home / Self Care | Payer: Medicare Other | Source: Ambulatory Visit | Attending: Family Medicine | Admitting: Family Medicine

## 2013-05-11 NOTE — Progress Notes (Signed)
Physical Therapy Treatment Patient Details  Name: Steven [name] MRN: [id number] Date of Birth: 04-07-1941  Today's Date: 05/11/2013 Time: 4098-1191 PT Time Calculation (min): 40 min Charges: Theract x 35'  Visit#: 16 of 17  Re-eval: 05/15/13  Authorization: Medicare  Authorization Visit#: 16 of 17   Subjective: Symptoms/Limitations Symptoms: Pt reports continued HEP compliance. Pain Assessment Currently in Pain?: No/denies   Exercise/Treatments Standing Other Standing Knee Exercises: Standing with correct posture to improve strength and standing tolerance Seated Other Seated Knee Exercises: Sit to stand avoiding using walker to stand errect to improve functional strength  Physical Therapy Assessment and Plan PT Assessment and Plan Clinical Impression Statement: Treatments focus continues to be on improving functional strength and balance. Pt requires multimodal cueing to improve form with sit to stand. Pt displays improved posture after tactile facilitation. Pt able to stand max of 30" without HHA. Pt will benefit from skilled therapeutic intervention in order to improve on the following deficits: Decreased activity tolerance;Decreased strength;Difficulty walking;Decreased range of motion;Decreased balance PT Plan: Continue to stress glute strength, posture and self lotioning of LE self donning of compression wrap.    Problem List Patient Active Problem List   Diagnosis Date Noted  . Poor balance 02/23/2013  . WEAKNESS 06/05/2008  . DYSPNEA ON EXERTION 11/15/2007  . CATARACT NOS 02/15/2007  . DIABETES MELLITUS, TYPE II, CONTROLLED, WITH COMPLICATIONS 08/24/2006  . RENAL FAILURE, ACUTE 07/30/2006  . HYPERLIPIDEMIA 04/26/2006  . ANEMIA-NOS 04/26/2006  . HYPERTENSION 04/26/2006  . KIDNEY DISEASE, CHRONIC, STAGE III 04/26/2006  . CEREBROVASCULAR ACCIDENT, HX OF 04/26/2006    PT - End of Session Activity Tolerance: Patient tolerated treatment well   Seth Bake,  PTA  05/11/2013, 11:51 AM

## 2013-05-16 ENCOUNTER — Ambulatory Visit (HOSPITAL_COMMUNITY)
Admission: RE | Admit: 2013-05-16 | Discharge: 2013-05-16 | Disposition: A | Discharge status: Home / Self Care | Payer: Medicare Other | Source: Ambulatory Visit | Attending: Family Medicine | Admitting: Family Medicine

## 2013-05-16 DIAGNOSIS — R2689 Other abnormalities of gait and mobility: Secondary | ICD-10-CM

## 2013-05-16 NOTE — Progress Notes (Signed)
Physical Therapy Treatment Patient Details  Name: Steven [name] MRN: [id number] Date of Birth: 1940/12/18  Today's Date: 05/16/2013 Time: 0940-1010 PT Time Calculation (min): 30 min Charge:  Self care 9450-1010 Visit#: 17 of 18  Re-eval: 05/17/13 Assessment Diagnosis: difficulty walking and Rt heel wound Prior Therapy: for wound care   Authorization: Medicare  Authorization Visit#: 17 of 18   Subjective: Symptoms/Limitations Symptoms: Pt states that he can not stay.  States his ride needs to leave but she is the one who is going to assist him donning and doffing his compression wrap so they came for a quick visit so that she coud learn how to don and doff compression wrap. Pertinent History: HTN, DM, Lt BKA, CVA,  How long can you sit comfortably?: no problem  Precautions/Restrictions  Precautions Precautions: Fall  Exercise/Treatments Pt neighbor explained the importance of washing LE and putting lotion on LE prior to donning compression garment.  Pt and neighbor educated on donning then demonstrated to therapist correct donning of compression garment.  Physical Therapy Assessment and Plan PT Assessment and Plan Clinical Impression Statement: Pt again had no time for gt or exercises.  Pt neighbor was able to be educated in donning and doffing compression wrap with no questions. Pt neighbor,(individual who drives pt to therapy) had to leave therefore there was not time for reevaluation. Pt will benefit from skilled therapeutic intervention in order to improve on the following deficits: Decreased activity tolerance;Decreased strength;Difficulty walking;Decreased range of motion;Decreased balance PT Plan: Totally discontinue wound therapy; reassess gt, balance and strength next session with anticipation of discharge from therapy.       Problem List Patient Active Problem List   Diagnosis Date Noted  . Poor balance 02/23/2013  . WEAKNESS 06/05/2008  . DYSPNEA ON EXERTION  11/15/2007  . CATARACT NOS 02/15/2007  . DIABETES MELLITUS, TYPE II, CONTROLLED, WITH COMPLICATIONS 08/24/2006  . RENAL FAILURE, ACUTE 07/30/2006  . HYPERLIPIDEMIA 04/26/2006  . ANEMIA-NOS 04/26/2006  . HYPERTENSION 04/26/2006  . KIDNEY DISEASE, CHRONIC, STAGE III 04/26/2006  . CEREBROVASCULAR ACCIDENT, HX OF 04/26/2006       GP    Toriano Aikey,CINDY 05/16/2013, 10:22 AM

## 2013-05-18 ENCOUNTER — Ambulatory Visit (HOSPITAL_COMMUNITY)
Admission: RE | Admit: 2013-05-18 | Discharge: 2013-05-18 | Disposition: A | Discharge status: Home / Self Care | Payer: Medicare Other | Source: Ambulatory Visit | Attending: Physical Therapy | Admitting: Physical Therapy

## 2013-05-18 DIAGNOSIS — R2689 Other abnormalities of gait and mobility: Secondary | ICD-10-CM

## 2013-05-18 NOTE — Evaluation (Signed)
Physical Therapy Evaluation  Patient Details  Name: Anhad Sheeley MRN: [id number] Date of Birth: 12-Jan-1941  Today's Date: 05/18/2013 Time: 0930-1013 PT Time Calculation (min): 43 min Charge MM test 4098-119; there ex [phone]             Visit#: 18 of 18  Re-eval: 05/17/13 Assessment Diagnosis: difficulty walking and Rt heel wound Prior Therapy: for wound care   Authorization: Medicare    Authorization Visit#: 18 of 18   Past Medical History:  Past Medical History  Diagnosis Date  . Amputated below knee   . Coronary artery disease   . Diabetes mellitus   . Hypertension   . Hypercholesteremia    Past Surgical History:  Past Surgical History  Procedure Laterality Date  . Amputation      Subjective Symptoms/Limitations Symptoms: Pt is completing his exercises on a daily basis Pertinent History: HTN, DM, Lt BKA, CVA,  How long can you sit comfortably?: no problem How long can you stand comfortably?: Pt is able to stand long enough to complete his ADLs  How long can you walk comfortably?: Pt is able to walk 300 feet was only a few steps   Precautions/Restrictions  Precautions Precautions: Fall   Cognition/Observation Observation/Other Assessments Observations: pt bent at 25 degrees flexion (was 35 degrees) due to hip flexion contracture.   Assessment RLE AROM (degrees) Right Knee Extension: 0 (was 0 04/20/13) Right Knee Flexion: 80 (was 80 04/20/13) Right Ankle Dorsiflexion: -5 (was -5 on 04/20/13) RLE PROM (degrees) Right Knee Flexion: 90 (was 90 on 04/20/2013) Right Ankle Dorsiflexion: 0 (was 0 04/20/2013) RLE Strength Right Hip Flexion: 3/5 (was 3+/5 04/20/2013) Right Hip Extension: 3-/5 (was 2-/5 04/20/2013) Right Hip ABduction: 2-/5 (was 2/5 04/20/2013) Right Knee Flexion: 2-/5 (was 3/5 04/20/2013) Right Knee Extension: 5/5 (was 5/5 04/20/13) Right Ankle Dorsiflexion: 3+/5 (was 3+/5 04/20/13) Right Ankle Plantar Flexion: 3/5 (was 3/5 04/20/13) LLE  Strength Left Hip Flexion: 4/5 (was 4/5 04/20/13) Left Hip Extension: 3/5 (was 2-/5 04/20/13) Left Hip ABduction: 2+/5 (was 2+/5 04/20/13) Left Knee Flexion: 3+/5 (was3+/5 on 04/20/13) Left Knee Extension: 5/5 (was 5/5 04/20/13)  Exercise/Treatments Mobility/Balance  Transfers Stand to Sit: 6: Modified independent (Device/Increase time) Ambulation/Gait Ambulation/Gait: Yes Ambulation/Gait Assistance: 7: Independent Assistive device: Rolling walker     Standing Other Standing Knee Exercises: Standing with correct posture to improve strength and standing tolerance Seated Other Seated Knee Exercises: Sit to stand avoiding using walker to stand errect to improve functional strength Supine Straight Leg Raises: Both;10 reps Sidelying Hip ABduction: Both;10 reps Prone  Hamstring Curl: 10 reps Hip Extension: 10 reps    Physical Therapy Assessment and Plan PT Assessment and Plan Clinical Impression Statement: Pt reassessed.  Pt has met all short term and long term goals.  Pt will be discharged with advance HEP. PT Plan: D/C therapy all goals met.    Goals Home Exercise Program Pt/caregiver will Perform Home Exercise Program: For increased strengthening PT Goal: Perform Home Exercise Program - Progress: Met PT Short Term Goals Time to Complete Short Term Goals: 2 weeks PT Short Term Goal 1: Pt to be able to come sit to stand from a standard chair height with mod.-max  assist  of one PT Short Term Goal 1 - Progress: Met PT Short Term Goal 2: Pt able to transfer with mod assist of one. PT Short Term Goal 2 - Progress: Met PT Short Term Goal 3: Pt to be able to stand without UE support x one minute. PT  Short Term Goal 3 - Progress: Met PT Long Term Goals Time to Complete Long Term Goals: 4 weeks PT Long Term Goal 1: I in advance HEP PT Long Term Goal 1 - Progress: Met PT Long Term Goal 2: Pt able to come sit to stand from a standard chair height with min-mod assist of one PT  Long Term Goal 2 - Progress: Met Long Term Goal 3: Pt able to stand without UE support x 2 minutes Long Term Goal 3 Progress: Met Long Term Goal 4: Pt to be able to stand upright with 20 degree hip flexion  Long Term Goal 4 Progress: Met PT Long Term Goal 5: Pt to be able to walk with walker for 200 feet Long Term Goal 5 Progress: Met  Problem List Patient Active Problem List   Diagnosis Date Noted  . Poor balance 02/23/2013  . WEAKNESS 06/05/2008  . DYSPNEA ON EXERTION 11/15/2007  . CATARACT NOS 02/15/2007  . DIABETES MELLITUS, TYPE II, CONTROLLED, WITH COMPLICATIONS 08/24/2006  . RENAL FAILURE, ACUTE 07/30/2006  . HYPERLIPIDEMIA 04/26/2006  . ANEMIA-NOS 04/26/2006  . HYPERTENSION 04/26/2006  . KIDNEY DISEASE, CHRONIC, STAGE III 04/26/2006  . CEREBROVASCULAR ACCIDENT, HX OF 04/26/2006    PT - End of Session Equipment Utilized During Treatment: Gait belt General Behavior During Therapy: [facility] for tasks assessed/performed  GP Functional Assessment Tool Used: Clinical judgement Functional Limitation: Changing and maintaining body position Changing and Maintaining Body Position Goal Status ([id number]): At least 60 percent but less than 80 percent impaired, limited or restricted Changing and Maintaining Body Position Discharge Status [id number]): At least 60 percent but less than 80 percent impaired, limited or restricted  Augustine Brannick,CINDY 05/18/2013, 2:02 PM  Physician Documentation Your signature is required to indicate approval of the treatment plan as stated above.  Please sign and either send electronically or make a copy of this report for your files and return this physician signed original.   Please mark one 1.__approve of plan  2. ___approve of plan with the following conditions.   ______________________________                                                          _____________________ Physician Signature                                                                                                              Date

## 2013-05-24 ENCOUNTER — Ambulatory Visit (HOSPITAL_COMMUNITY): Payer: Medicare Other | Admitting: Physical Therapy

## 2013-05-29 ENCOUNTER — Ambulatory Visit (HOSPITAL_COMMUNITY): Payer: Medicare Other | Admitting: *Deleted

## 2013-05-31 ENCOUNTER — Ambulatory Visit (HOSPITAL_COMMUNITY): Payer: Medicare Other | Admitting: *Deleted

## 2013-07-13 ENCOUNTER — Telehealth (HOSPITAL_COMMUNITY): Payer: Self-pay | Admitting: Physical Therapy

## 2013-09-25 IMAGING — CR DG HAND COMPLETE 3+V*R*
3 series · 3 of 3 positions shown · non-contrast
Comparison: No priors.

CLINICAL DATA: Bilateral hand pain and swelling.

RIGHT HAND - COMPLETE 3+ VIEW

[view not recorded (1 of 3)]
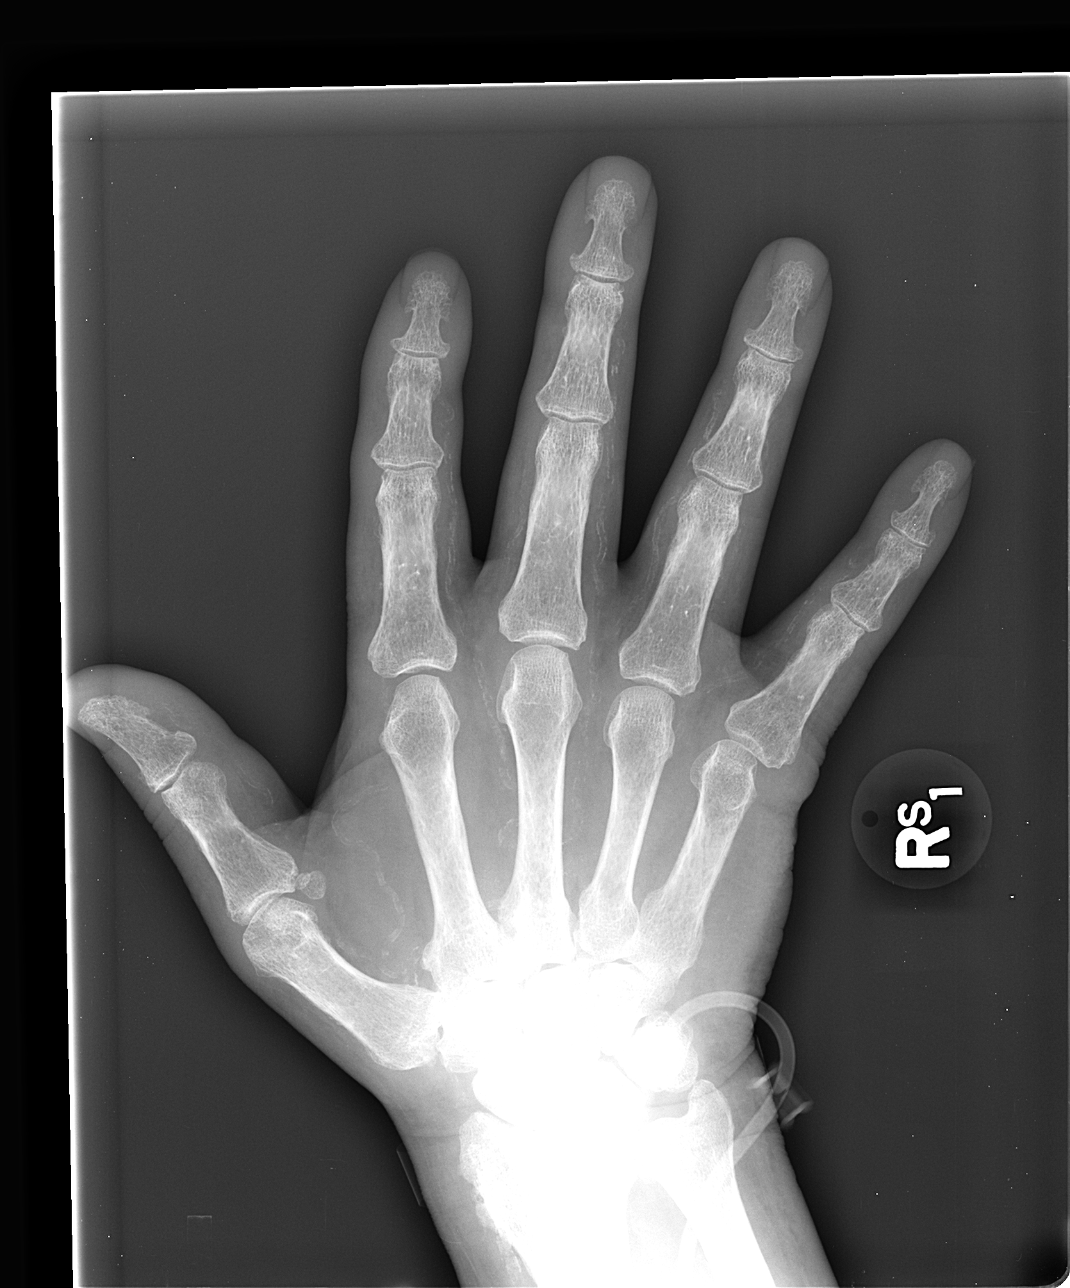

[view not recorded (2 of 3)]
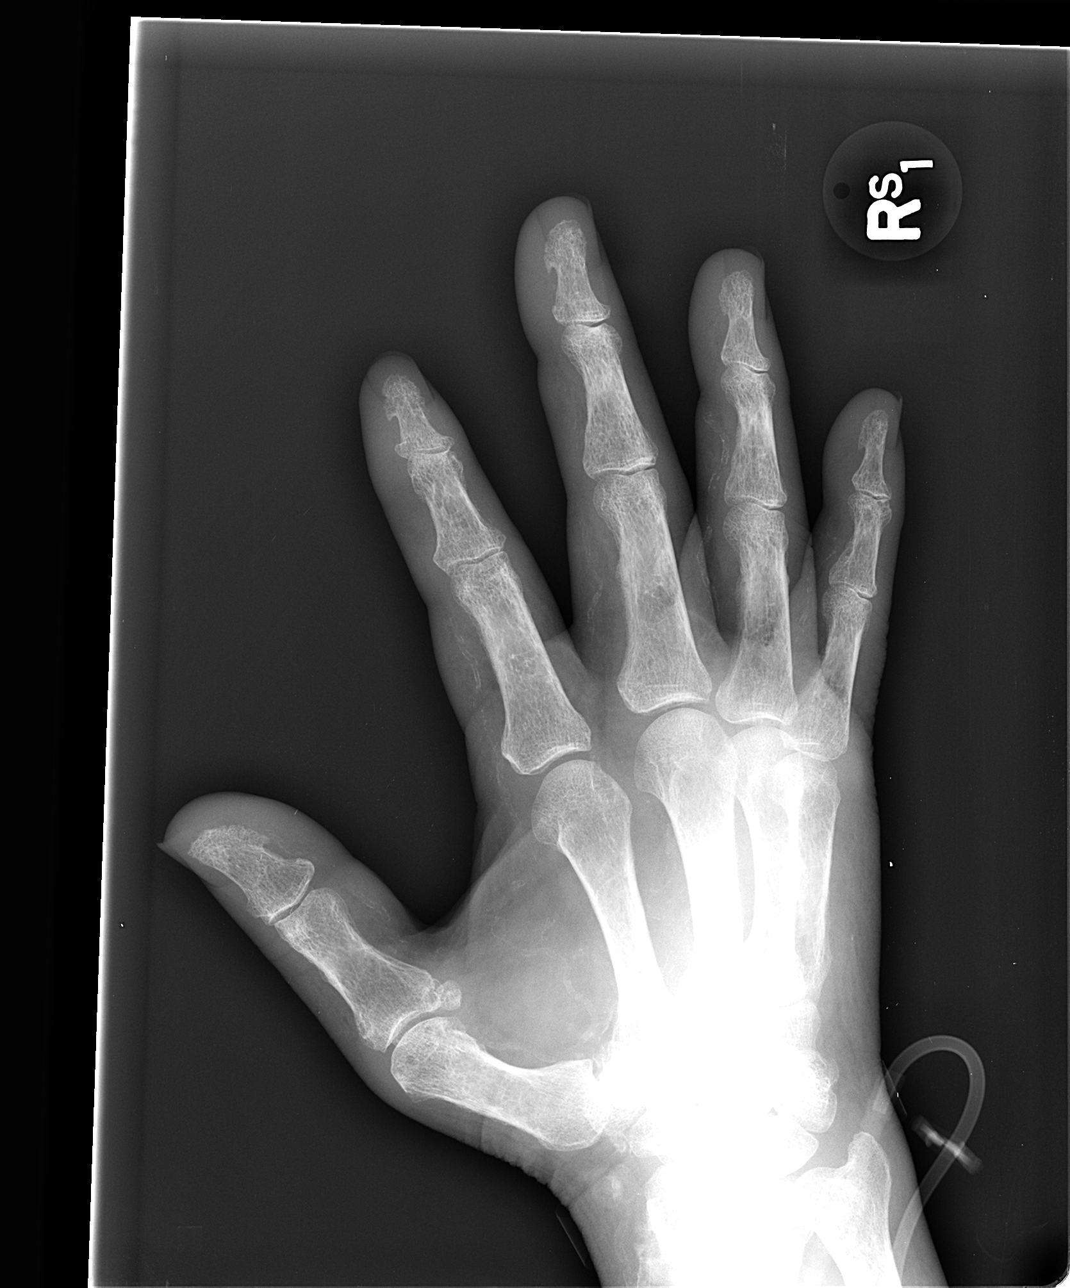

[view not recorded (3 of 3)]
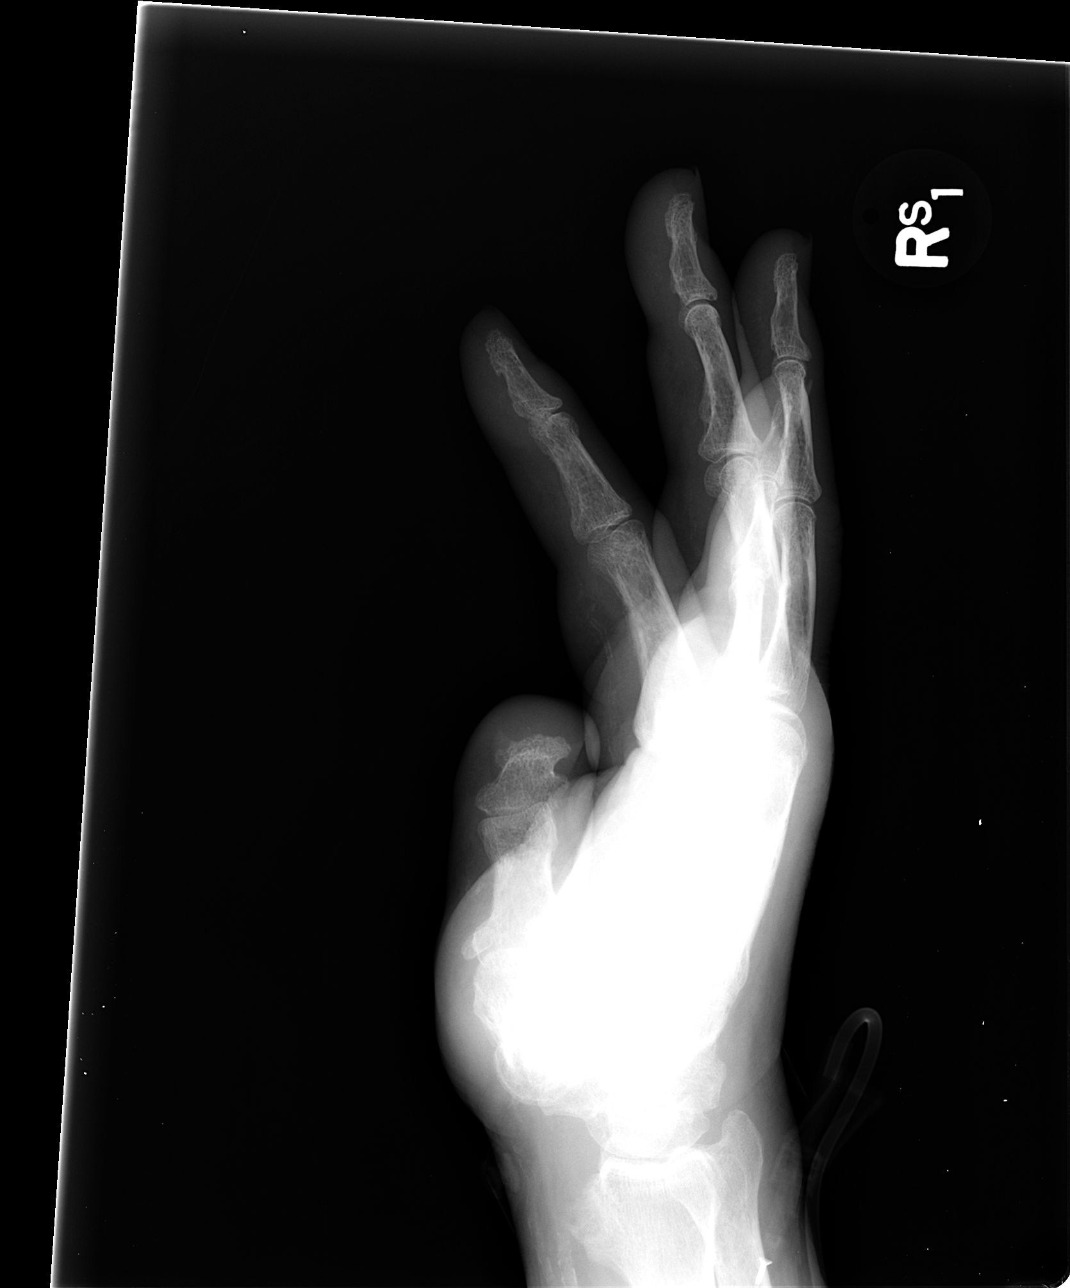

[3 of 3 positions shown; findings below may reference images not displayed]

FINDINGS: Three views of the right hand demonstrate no acute
displaced fracture, subluxation, dislocation, joint or soft tissue
abnormality.  There is some mild joint space narrowing, subchondral
sclerosis and osteophyte formation, most pronounced in the DIP, PIP
and first MCP joints, compatible with mild osteoarthritis.
Numerous vascular calcifications are noted.
IMPRESSION: 1.  No acute radiographic abnormality of the right hand.
2.  Mild degenerative changes of osteoarthritis, as above.
3.  Extensive atherosclerosis.

## 2013-09-25 IMAGING — CR DG HAND COMPLETE 3+V*L*
3 series · 3 of 3 positions shown · non-contrast
Comparison: No priors.

CLINICAL DATA: Bilateral hand pain and swelling.

LEFT HAND - COMPLETE 3+ VIEW

[view not recorded (1 of 3)]
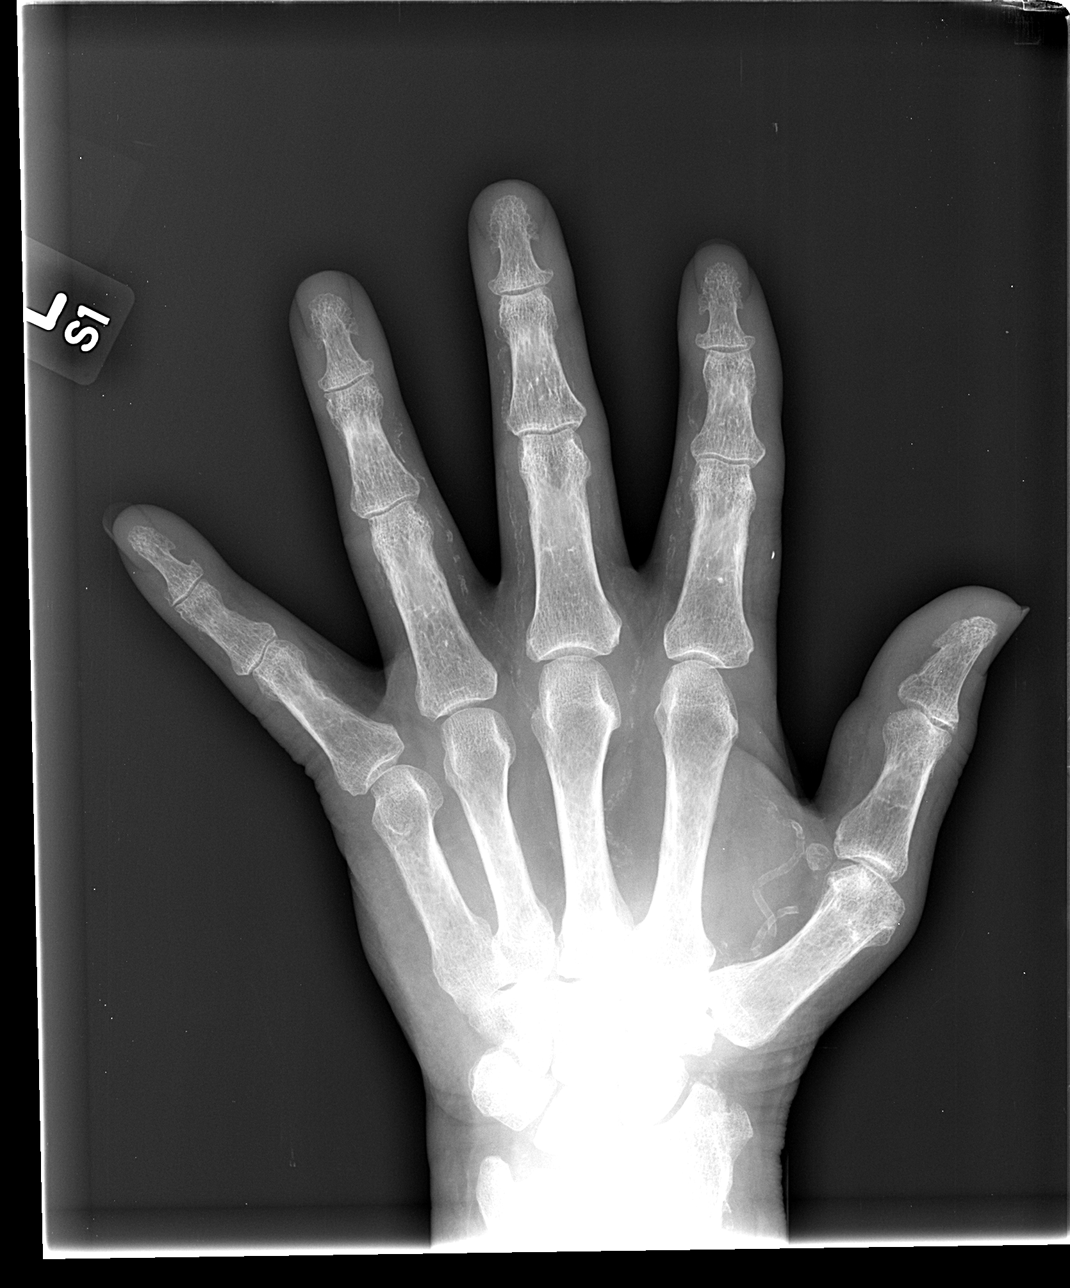

[view not recorded (2 of 3)]
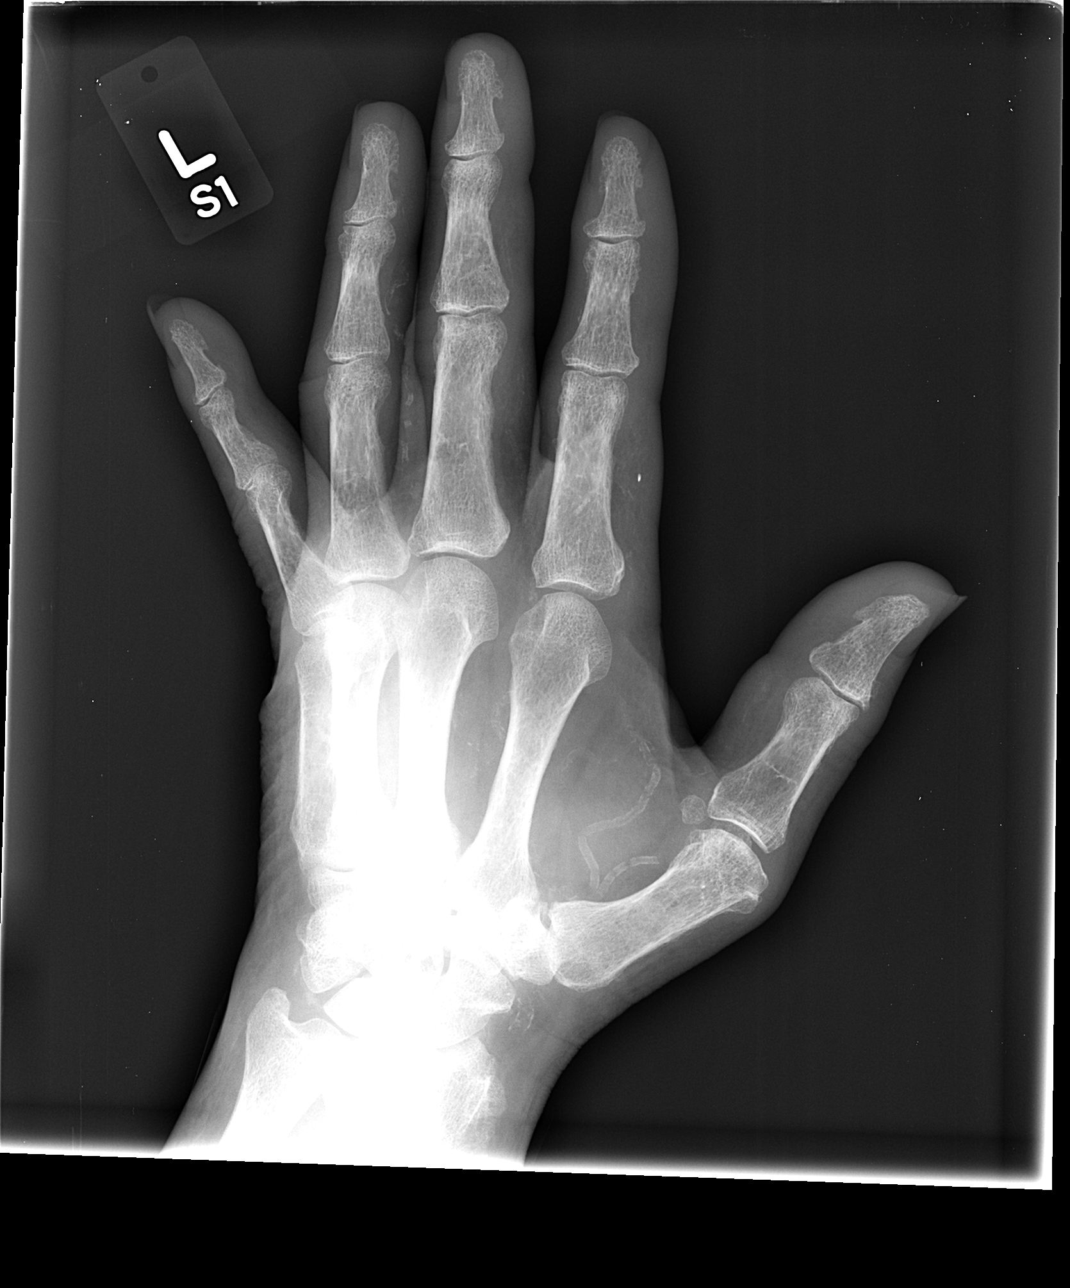

[view not recorded (3 of 3)]
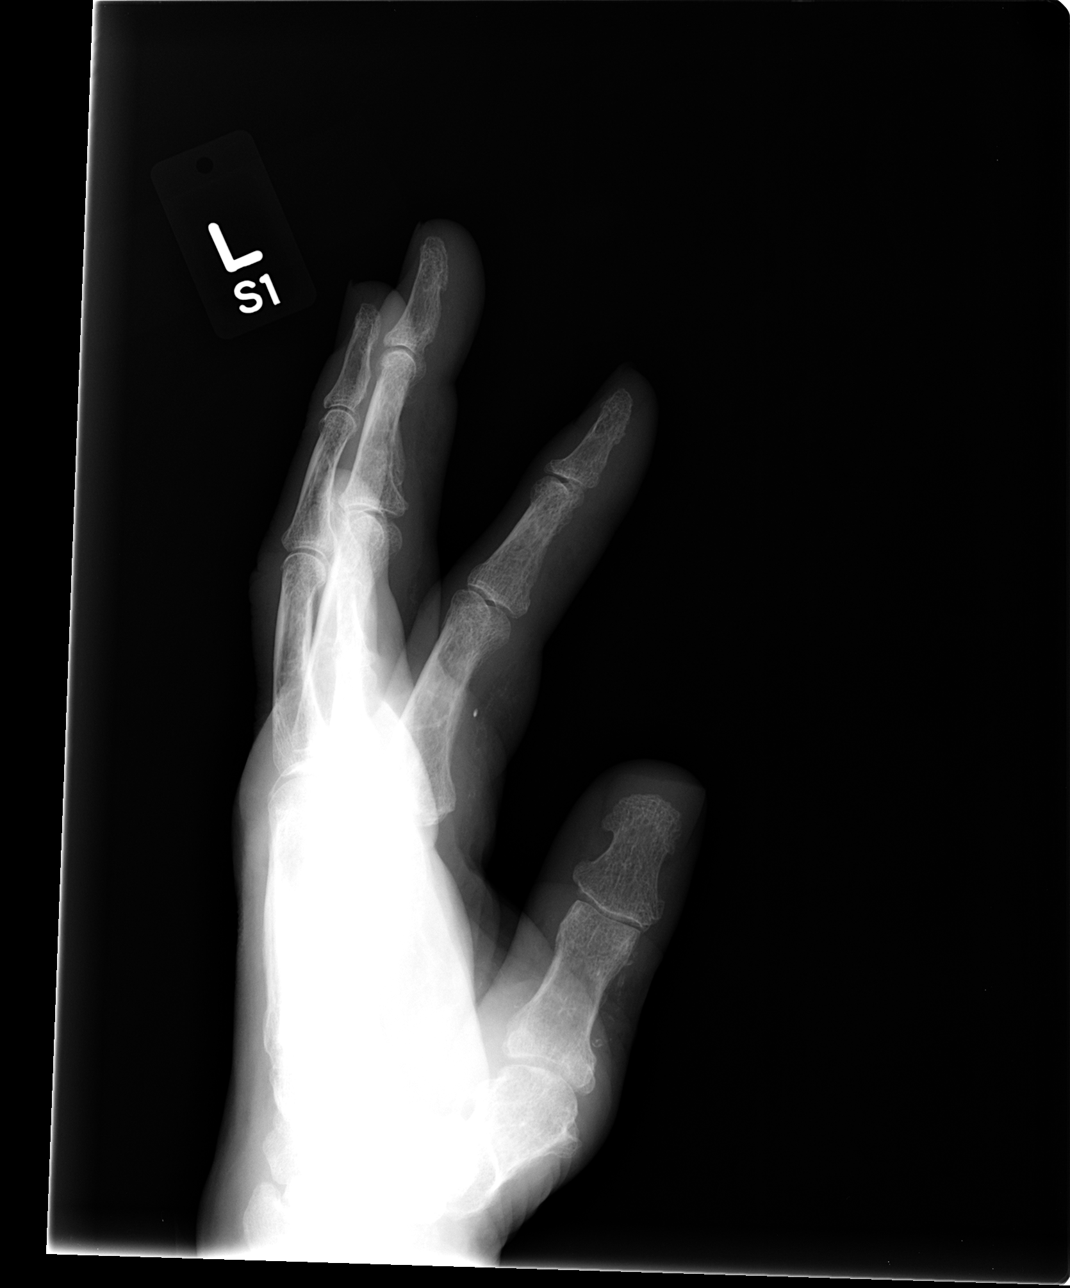

[3 of 3 positions shown; findings below may reference images not displayed]

FINDINGS: Three views of the left hand demonstrate no acute
displaced fracture, subluxation, dislocation, joint or soft tissue
abnormality.  There is some mild joint space narrowing, subchondral
sclerosis and osteophyte formation, most pronounced in the DIP, PIP
and first MCP joints, compatible with mild osteoarthritis.
Numerous vascular calcifications are noted.
IMPRESSION: 1.  No acute radiographic abnormality of the left hand.
2.  Mild degenerative changes of osteoarthritis, as above.
3.  Extensive atherosclerosis.

## 2014-03-02 ENCOUNTER — Emergency Department (HOSPITAL_COMMUNITY): Payer: Medicare Other

## 2014-03-02 ENCOUNTER — Encounter (HOSPITAL_COMMUNITY): Payer: Self-pay | Admitting: Emergency Medicine

## 2014-03-02 ENCOUNTER — Inpatient Hospital Stay (HOSPITAL_COMMUNITY)
Admission: EM | Admit: 2014-03-02 | Discharge: 2014-03-09 | DRG: 558 | Disposition: A | Discharge status: Skilled Nursing Facility | Payer: Medicare Other | Attending: Internal Medicine | Admitting: Internal Medicine

## 2014-03-02 DIAGNOSIS — E1122 Type 2 diabetes mellitus with diabetic chronic kidney disease: Secondary | ICD-10-CM | POA: Diagnosis present

## 2014-03-02 DIAGNOSIS — I129 Hypertensive chronic kidney disease with stage 1 through stage 4 chronic kidney disease, or unspecified chronic kidney disease: Secondary | ICD-10-CM | POA: Diagnosis present

## 2014-03-02 DIAGNOSIS — I739 Peripheral vascular disease, unspecified: Secondary | ICD-10-CM | POA: Diagnosis present

## 2014-03-02 DIAGNOSIS — N189 Chronic kidney disease, unspecified: Secondary | ICD-10-CM | POA: Diagnosis present

## 2014-03-02 DIAGNOSIS — I10 Essential (primary) hypertension: Secondary | ICD-10-CM

## 2014-03-02 DIAGNOSIS — N390 Urinary tract infection, site not specified: Secondary | ICD-10-CM | POA: Diagnosis present

## 2014-03-02 DIAGNOSIS — Y92009 Unspecified place in unspecified non-institutional (private) residence as the place of occurrence of the external cause: Secondary | ICD-10-CM | POA: Diagnosis not present

## 2014-03-02 DIAGNOSIS — Z8673 Personal history of transient ischemic attack (TIA), and cerebral infarction without residual deficits: Secondary | ICD-10-CM

## 2014-03-02 DIAGNOSIS — N183 Chronic kidney disease, stage 3 unspecified: Secondary | ICD-10-CM

## 2014-03-02 DIAGNOSIS — M6282 Rhabdomyolysis: Principal | ICD-10-CM | POA: Diagnosis present

## 2014-03-02 DIAGNOSIS — E780 Pure hypercholesterolemia: Secondary | ICD-10-CM | POA: Diagnosis present

## 2014-03-02 DIAGNOSIS — D649 Anemia, unspecified: Secondary | ICD-10-CM | POA: Diagnosis present

## 2014-03-02 DIAGNOSIS — W19XXXA Unspecified fall, initial encounter: Secondary | ICD-10-CM | POA: Diagnosis present

## 2014-03-02 DIAGNOSIS — N179 Acute kidney failure, unspecified: Secondary | ICD-10-CM | POA: Diagnosis present

## 2014-03-02 DIAGNOSIS — Z89512 Acquired absence of left leg below knee: Secondary | ICD-10-CM

## 2014-03-02 DIAGNOSIS — Z66 Do not resuscitate: Secondary | ICD-10-CM | POA: Diagnosis present

## 2014-03-02 DIAGNOSIS — E785 Hyperlipidemia, unspecified: Secondary | ICD-10-CM | POA: Diagnosis present

## 2014-03-02 DIAGNOSIS — I2510 Atherosclerotic heart disease of native coronary artery without angina pectoris: Secondary | ICD-10-CM | POA: Diagnosis present

## 2014-03-02 DIAGNOSIS — N433 Hydrocele, unspecified: Secondary | ICD-10-CM | POA: Diagnosis present

## 2014-03-02 DIAGNOSIS — Z87891 Personal history of nicotine dependence: Secondary | ICD-10-CM | POA: Diagnosis not present

## 2014-03-02 DIAGNOSIS — N508 Other specified disorders of male genital organs: Secondary | ICD-10-CM | POA: Diagnosis present

## 2014-03-02 DIAGNOSIS — N289 Disorder of kidney and ureter, unspecified: Secondary | ICD-10-CM

## 2014-03-02 DIAGNOSIS — E860 Dehydration: Secondary | ICD-10-CM | POA: Diagnosis present

## 2014-03-02 DIAGNOSIS — E872 Acidosis: Secondary | ICD-10-CM | POA: Diagnosis present

## 2014-03-02 DIAGNOSIS — R531 Weakness: Secondary | ICD-10-CM | POA: Diagnosis present

## 2014-03-02 DIAGNOSIS — R296 Repeated falls: Secondary | ICD-10-CM | POA: Diagnosis present

## 2014-03-02 LAB — CBC WITH DIFFERENTIAL/PLATELET
BASOS PCT: 0 % (ref 0–1)
Basophils Absolute: 0 10*3/uL (ref 0.0–0.1)
EOS ABS: 0 10*3/uL (ref 0.0–0.7)
EOS PCT: 0 % (ref 0–5)
HCT: 34.6 % — ABNORMAL LOW (ref 39.0–52.0)
HEMOGLOBIN: 11.3 g/dL — AB (ref 13.0–17.0)
LYMPHS ABS: 0.5 10*3/uL — AB (ref 0.7–4.0)
Lymphocytes Relative: 5 % — ABNORMAL LOW (ref 12–46)
MCH: 21.6 pg — AB (ref 26.0–34.0)
MCHC: 32.7 g/dL (ref 30.0–36.0)
MCV: 66.2 fL — AB (ref 78.0–100.0)
MONOS PCT: 9 % (ref 3–12)
Monocytes Absolute: 1 10*3/uL (ref 0.1–1.0)
Neutro Abs: 9.1 10*3/uL — ABNORMAL HIGH (ref 1.7–7.7)
Neutrophils Relative %: 86 % — ABNORMAL HIGH (ref 43–77)
Platelets: 248 10*3/uL (ref 150–400)
RBC: 5.23 MIL/uL (ref 4.22–5.81)
RDW: 17.4 % — ABNORMAL HIGH (ref 11.5–15.5)
WBC: 10.6 10*3/uL — ABNORMAL HIGH (ref 4.0–10.5)

## 2014-03-02 LAB — URINALYSIS, ROUTINE W REFLEX MICROSCOPIC
Glucose, UA: NEGATIVE mg/dL
Ketones, ur: NEGATIVE mg/dL
NITRITE: NEGATIVE
PROTEIN: 100 mg/dL — AB
Specific Gravity, Urine: 1.025 (ref 1.005–1.030)
UROBILINOGEN UA: 0.2 mg/dL (ref 0.0–1.0)
pH: 5 (ref 5.0–8.0)

## 2014-03-02 LAB — COMPREHENSIVE METABOLIC PANEL
ALT: 39 U/L (ref 0–53)
ANION GAP: 18 — AB (ref 5–15)
AST: 133 U/L — ABNORMAL HIGH (ref 0–37)
Albumin: 3.1 g/dL — ABNORMAL LOW (ref 3.5–5.2)
Alkaline Phosphatase: 108 U/L (ref 39–117)
BUN: 43 mg/dL — AB (ref 6–23)
CALCIUM: 8.7 mg/dL (ref 8.4–10.5)
CO2: 21 meq/L (ref 19–32)
CREATININE: 2.51 mg/dL — AB (ref 0.50–1.35)
Chloride: 105 mEq/L (ref 96–112)
GFR, EST AFRICAN AMERICAN: 27 mL/min — AB (ref >90)
GFR, EST NON AFRICAN AMERICAN: 24 mL/min — AB (ref >90)
GLUCOSE: 227 mg/dL — AB (ref 70–99)
Potassium: 4.4 mEq/L (ref 3.7–5.3)
Sodium: 144 mEq/L (ref 137–147)
TOTAL PROTEIN: 8.2 g/dL (ref 6.0–8.3)
Total Bilirubin: 0.5 mg/dL (ref 0.3–1.2)

## 2014-03-02 LAB — URINE MICROSCOPIC-ADD ON

## 2014-03-02 LAB — I-STAT CG4 LACTIC ACID, ED: LACTIC ACID, VENOUS: 2.77 mmol/L — AB (ref 0.5–2.2)

## 2014-03-02 MED ORDER — ALUM & MAG HYDROXIDE-SIMETH 200-200-20 MG/5ML PO SUSP
30.0000 mL | Freq: Four times a day (QID) | ORAL | Status: DC | PRN
  Administered 2014-03-02: 30 mL via ORAL
  Filled 2014-03-02: qty 30

## 2014-03-02 MED ORDER — ACETAMINOPHEN 325 MG PO TABS
650.0000 mg | ORAL_TABLET | Freq: Four times a day (QID) | ORAL | Status: DC | PRN
  Administered 2014-03-03: 650 mg via ORAL
  Filled 2014-03-02: qty 2

## 2014-03-02 MED ORDER — INFLUENZA VAC SPLIT QUAD 0.5 ML IM SUSY
0.5000 mL | PREFILLED_SYRINGE | INTRAMUSCULAR | Status: DC
  Filled 2014-03-02: qty 0.5

## 2014-03-02 MED ORDER — METOPROLOL TARTRATE 50 MG PO TABS
200.0000 mg | ORAL_TABLET | Freq: Two times a day (BID) | ORAL | Status: DC
  Administered 2014-03-02 – 2014-03-09 (×14): 200 mg via ORAL
  Filled 2014-03-02 (×14): qty 4

## 2014-03-02 MED ORDER — SODIUM CHLORIDE 0.9 % IV SOLN
INTRAVENOUS | Status: AC
  Administered 2014-03-02: 125 mL/h via INTRAVENOUS

## 2014-03-02 MED ORDER — ACETAMINOPHEN 650 MG RE SUPP: 650.0000 mg | Freq: Four times a day (QID) | RECTAL | Status: DC | PRN

## 2014-03-02 MED ORDER — SODIUM CHLORIDE 0.9 % IV BOLUS (SEPSIS)
1000.0000 mL | Freq: Once | INTRAVENOUS | Status: AC
  Administered 2014-03-02: 1000 mL via INTRAVENOUS

## 2014-03-02 MED ORDER — INSULIN ASPART 100 UNIT/ML ~~LOC~~ SOLN
0.0000 [IU] | Freq: Three times a day (TID) | SUBCUTANEOUS | Status: DC
  Administered 2014-03-03: 3 [IU] via SUBCUTANEOUS
  Administered 2014-03-03: 5 [IU] via SUBCUTANEOUS
  Administered 2014-03-03: 3 [IU] via SUBCUTANEOUS
  Administered 2014-03-04 (×2): 2 [IU] via SUBCUTANEOUS
  Administered 2014-03-04: 3 [IU] via SUBCUTANEOUS
  Administered 2014-03-05: 2 [IU] via SUBCUTANEOUS
  Administered 2014-03-05: 3 [IU] via SUBCUTANEOUS
  Administered 2014-03-06: 5 [IU] via SUBCUTANEOUS
  Administered 2014-03-06: 3 [IU] via SUBCUTANEOUS
  Administered 2014-03-07: 2 [IU] via SUBCUTANEOUS
  Administered 2014-03-07: 3 [IU] via SUBCUTANEOUS
  Administered 2014-03-07 – 2014-03-08 (×2): 2 [IU] via SUBCUTANEOUS
  Administered 2014-03-08: 3 [IU] via SUBCUTANEOUS
  Administered 2014-03-08: 2 [IU] via SUBCUTANEOUS
  Administered 2014-03-09: 3 [IU] via SUBCUTANEOUS

## 2014-03-02 MED ORDER — INSULIN ASPART 100 UNIT/ML ~~LOC~~ SOLN
0.0000 [IU] | Freq: Every day | SUBCUTANEOUS | Status: DC
  Administered 2014-03-04: 3 [IU] via SUBCUTANEOUS
  Administered 2014-03-06: 2 [IU] via SUBCUTANEOUS

## 2014-03-02 MED ORDER — DOXAZOSIN MESYLATE 2 MG PO TABS
4.0000 mg | ORAL_TABLET | Freq: Every day | ORAL | Status: DC
  Administered 2014-03-02 – 2014-03-08 (×7): 4 mg via ORAL
  Filled 2014-03-02 (×7): qty 2

## 2014-03-02 MED ORDER — ASPIRIN 81 MG PO TABS
81.0000 mg | ORAL_TABLET | Freq: Every day | ORAL | Status: DC
  Administered 2014-03-02: 81 mg via ORAL
  Filled 2014-03-02 (×5): qty 1

## 2014-03-02 MED ORDER — DOCUSATE SODIUM 100 MG PO CAPS: 100.0000 mg | ORAL_CAPSULE | Freq: Every day | ORAL | Status: DC | PRN

## 2014-03-02 MED ORDER — AMLODIPINE BESYLATE 5 MG PO TABS
10.0000 mg | ORAL_TABLET | Freq: Every day | ORAL | Status: DC
  Administered 2014-03-02: 10 mg via ORAL
  Filled 2014-03-02 (×2): qty 2

## 2014-03-02 MED ORDER — ENOXAPARIN SODIUM 40 MG/0.4ML ~~LOC~~ SOLN
40.0000 mg | SUBCUTANEOUS | Status: DC
Start: 2014-03-02 — End: 2014-03-06
  Administered 2014-03-02 – 2014-03-05 (×4): 40 mg via SUBCUTANEOUS
  Filled 2014-03-02 (×4): qty 0.4

## 2014-03-02 MED ORDER — PNEUMOCOCCAL VAC POLYVALENT 25 MCG/0.5ML IJ INJ
0.5000 mL | INJECTION | INTRAMUSCULAR | Status: DC
  Filled 2014-03-02: qty 0.5

## 2014-03-02 MED ORDER — HYDRALAZINE HCL 25 MG PO TABS
100.0000 mg | ORAL_TABLET | Freq: Three times a day (TID) | ORAL | Status: DC
  Administered 2014-03-02 – 2014-03-09 (×20): 100 mg via ORAL
  Filled 2014-03-02: qty 4
  Filled 2014-03-02 (×3): qty 1
  Filled 2014-03-02 (×5): qty 4
  Filled 2014-03-02: qty 1
  Filled 2014-03-02 (×6): qty 4
  Filled 2014-03-02: qty 1
  Filled 2014-03-02 (×2): qty 4
  Filled 2014-03-02 (×2): qty 1
  Filled 2014-03-02: qty 4
  Filled 2014-03-02: qty 1
  Filled 2014-03-02 (×4): qty 4

## 2014-03-02 MED ORDER — CEFTRIAXONE SODIUM 1 G IJ SOLR
1.0000 g | INTRAMUSCULAR | Status: DC
  Administered 2014-03-03 – 2014-03-08 (×6): 1 g via INTRAVENOUS
  Filled 2014-03-02 (×9): qty 10

## 2014-03-02 MED ORDER — ONDANSETRON HCL 4 MG PO TABS: 4.0000 mg | ORAL_TABLET | Freq: Four times a day (QID) | ORAL | Status: DC | PRN

## 2014-03-02 MED ORDER — HYDRALAZINE HCL 25 MG PO TABS
ORAL_TABLET | ORAL | Status: AC
  Filled 2014-03-02: qty 4

## 2014-03-02 MED ORDER — ONDANSETRON HCL 4 MG/2ML IJ SOLN: 4.0000 mg | Freq: Four times a day (QID) | INTRAMUSCULAR | Status: DC | PRN

## 2014-03-02 MED ORDER — DEXTROSE 5 % IV SOLN
1.0000 g | Freq: Once | INTRAVENOUS | Status: AC
  Administered 2014-03-02: 1 g via INTRAVENOUS
  Filled 2014-03-02: qty 10

## 2014-03-02 NOTE — ED Notes (Signed)
Per EMS, called out to pt's house by police because family unable to get in touch with pt.  Per EMS, pt was found on  the floor. States he fell and could not get up. Pt is a double amputee and has been crawling around in the floor.

## 2014-03-02 NOTE — H&P (Signed)
History and Physical  Steven PinksJoseph [name] VHQ:[id number] DOB: 1940-09-19 DOA: 03/02/2014  Referring physician: Dr Jeraldine LootsLockwood, ED physician PCP: Milana ObeyKNOWLTON,STEPHEN D, MD   Chief Complaint: Weakness  HPI: Steven PinksJoseph Cecilio is a 73 y.o. male  With type 2 diabetes that is controlled with oral medications, chronic kidney disease stage III, hypertension, hyperlipidemia, left BKA who presents to the hospital with one week of weakness with fevers and chills that started on Tuesday. The patient has suffered multiple falls over the past week due to his weakness and has been crawling on his knees. Additionally, his weakness has become worse over the past couple of days. No palliating or provoking factor.   Review of Systems:   Pt complains of frequency, urgency.  Pt denies any chest pain, shortness of breath, cough, abdominal pain, nausea, vomiting, lightheadedness, dizziness.  Review of systems are otherwise negative  Past Medical History  Diagnosis Date  . Amputated below knee   . Coronary artery disease   . Diabetes mellitus   . Hypertension   . Hypercholesteremia    Past Surgical History  Procedure Laterality Date  . Amputation     Social History:  reports that he has quit smoking. He does not have any smokeless tobacco history on file. He reports that he does not drink alcohol or use illicit drugs. Patient lives at home & is able to participate in activities of daily living  Allergies  Allergen Reactions  . Amlodipine Besylate-Valsartan     REACTION: renal deterioration (Patient does not recall this allergy. This medication is BRAND NAME EXFORGE which includes NORVASC (Amlodipine) which patient takes daily. Patient is not aware of the reaction, the medication listed, or the allergy itself. Advised patient to verify this medication allergy and reaction with physician.    No family history on file.    Prior to Admission medications   Medication Sig Start Date End Date Taking? Authorizing  Provider  amLODipine (NORVASC) 10 MG tablet Take 10 mg by mouth daily.   Yes Historical Provider, MD  aspirin 81 MG tablet Take 81 mg by mouth daily.   Yes Historical Provider, MD  doxazosin (CARDURA) 4 MG tablet Take 4 mg by mouth at bedtime.   Yes Historical Provider, MD  ferrous sulfate 325 (65 FE) MG tablet Take 325 mg by mouth daily.   Yes Historical Provider, MD  furosemide (LASIX) 40 MG tablet Take 1 tablet by mouth daily. 01/12/14  Yes Historical Provider, MD  hydrALAZINE (APRESOLINE) 100 MG tablet Take 100 mg by mouth 3 (three) times daily.   Yes Historical Provider, MD  lisinopril (PRINIVIL,ZESTRIL) 10 MG tablet Take 10 mg by mouth daily.   Yes Historical Provider, MD  metFORMIN (GLUCOPHAGE) 500 MG tablet Take 1 tablet by mouth daily. 01/12/14  Yes Historical Provider, MD  metoprolol (LOPRESSOR) 100 MG tablet Take 200 mg by mouth 2 (two) times daily.   Yes Historical Provider, MD    Physical Exam: BP 117/59  Pulse 86  Temp(Src) 98 F (36.7 C) (Oral)  Resp 22  Ht 6' (1.829 m)  Wt 113.399 kg (250 lb)  BMI 33.90 kg/m2  SpO2 93%  General: Elderly black male. Awake and alert and oriented x3. No acute cardiopulmonary distress.  Eyes: Pupils equal, round, reactive to light. Extraocular muscles are intact. Sclerae anicteric and noninjected.  ENT: External auditory canals are patent and tympanic membranes reflect a good cone of light. Moist mucosal membranes. No mucosal lesions. Teeth in poor repair  Neck: Neck supple without lymphadenopathy. No  carotid bruits. No masses palpated.  Cardiovascular: Regular rate with normal S1-S2 sounds. No murmurs, rubs, gallops auscultated. No JVD.  Respiratory: Good respiratory effort with no wheezes, rales, rhonchi. Lungs clear to auscultation bilaterally.  Abdomen: Soft, nontender, nondistended. Active bowel sounds. No masses or hepatosplenomegaly  Skin: Dry, warm to touch. 2+ dorsalis pedis and radial pulses. 4 cm in diameter abrasions just below his  knees bilaterally. These are hemostatic Musculoskeletal: No calf or leg pain. All major joints not erythematous nontender. Left BKA Psychiatric: Intact judgment and insight.  Neurologic: No focal neurological deficits. Cranial nerves II through XII are grossly intact.           Labs on Admission:  Basic Metabolic Panel:  Recent Labs Lab 03/02/14 1402  NA 144  K 4.4  CL 105  CO2 21  GLUCOSE 227*  BUN 43*  CREATININE 2.51*  CALCIUM 8.7   Liver Function Tests:  Recent Labs Lab 03/02/14 1402  AST 133*  ALT 39  ALKPHOS 108  BILITOT 0.5  PROT 8.2  ALBUMIN 3.1*   No results found for this basename: LIPASE, AMYLASE,  in the last 168 hours No results found for this basename: AMMONIA,  in the last 168 hours CBC:  Recent Labs Lab 03/02/14 1402  WBC 10.6*  NEUTROABS 9.1*  HGB 11.3*  HCT 34.6*  MCV 66.2*  PLT 248   Cardiac Enzymes: No results found for this basename: CKTOTAL, CKMB, CKMBINDEX, TROPONINI,  in the last 168 hours  BNP (last 3 results) No results found for this basename: PROBNP,  in the last 8760 hours CBG: No results found for this basename: GLUCAP,  in the last 168 hours  Radiological Exams on Admission: Dg Chest 2 View  03/02/2014   CLINICAL DATA:  Weakness x1 week.  Unwitnessed fall.  Hypertension.  EXAM: CHEST  2 VIEW  COMPARISON:  06/12/2012.  FINDINGS: Mediastinum and hilar structures normal. Cardiomegaly, normal pulmonary vascularity. Subsegmental atelectasis right lung base. No pleural effusion or pneumothorax. No acute bony abnormality.  IMPRESSION: 1. Mild right base subsegmental axis. 2. Stable cardiomegaly, no CHF.  No acute cardiopulmonary disease.   Electronically Signed   By: Maisie Fus  Register   On: 03/02/2014 16:40    EKG: Independently reviewed. Sinus rhythm at 60 beats per minute. No ST elevations.  Assessment/Plan Present on Admission:  . Acute on chronic renal insufficiency . Dehydration . UTI (lower urinary tract infection)  #1  urinary tract infection #2 acute on chronic renal insufficiency secondary to dehydration  #3 dehydration secondary to urinary tract infection  We'll admit to medical floor to Dr. Sudie Bailey. We'll continue fluid hydration. Will continue ceftriaxone. Urine cultures were obtained and the ED. Due to the acute on chronic renal insufficiency, will hold metformin and lisinopril.  Due to the dehydration, will hold the Lasix.  #4 diabetes Sliding-scale insulin pre-meal and  Bedtime  #5 hypertension Currently controlled  DVT prophylaxis: Lovenox  Consultants: None  Code Status: DO NOT RESUSCITATE  Family Communication: None   Disposition Plan: Home following treatment  Time spent: 70 minutes  Candelaria Celeste, DO [facility] Pager [phone]  **Disclaimer: This note may have been dictated with voice recognition software. Similar sounding words can inadvertently be transcribed and this note may contain transcription errors which may not have been corrected upon publication of note.**

## 2014-03-02 NOTE — ED Provider Notes (Signed)
CSN: [id number]     Arrival date & time 03/02/14  1208 History  This chart was scribed for Gerhard Munch, MD by Richarda Overlie, ED Scribe. This patient was seen in room APA05/APA05 and the patient's care was started 1:10 PM.    Chief Complaint  Patient presents with  . Fall    The history is provided by the patient. No language interpreter was used.   HPI Comments: Steven [name] is a 73 y.o. male with a history of HTN, DM, and hyperlipidemia who presents to the Emergency Department complaining of gradually worsening generalized weakness the past week. He reports that he has been crawling due to his weakness, and fell when trying to transfer himself from one wheelchair to another.  He reports that he lives at home by himself. PSHx of left leg amputation in 2005.  He denies fever, cough, dyspnea, vomiting, diarrhea, and syncope.   Past Medical History  Diagnosis Date  . Amputated below knee   . Coronary artery disease   . Diabetes mellitus   . Hypertension   . Hypercholesteremia    Past Surgical History  Procedure Laterality Date  . Amputation     No family history on file. History  Substance Use Topics  . Smoking status: Former Games developer  . Smokeless tobacco: Not on file  . Alcohol Use: No    Review of Systems  Constitutional:       Per HPI, otherwise negative  HENT:       Per HPI, otherwise negative  Respiratory:       Per HPI, otherwise negative  Cardiovascular:       Per HPI, otherwise negative  Gastrointestinal: Negative for vomiting and abdominal pain.  Endocrine:       Negative aside from HPI  Genitourinary:       Neg aside from HPI   Musculoskeletal:       Per HPI, otherwise negative  Skin: Negative.   Neurological: Negative for syncope.      Allergies  Amlodipine besylate-valsartan  Home Medications   Prior to Admission medications   Medication Sig Start Date End Date Taking? Authorizing Provider  amLODipine (NORVASC) 10 MG tablet Take 10 mg by  mouth daily.   Yes Historical Provider, MD  aspirin 81 MG tablet Take 81 mg by mouth daily.   Yes Historical Provider, MD  doxazosin (CARDURA) 4 MG tablet Take 4 mg by mouth at bedtime.   Yes Historical Provider, MD  ferrous sulfate 325 (65 FE) MG tablet Take 325 mg by mouth daily.   Yes Historical Provider, MD  furosemide (LASIX) 40 MG tablet Take 1 tablet by mouth daily. 01/12/14  Yes Historical Provider, MD  hydrALAZINE (APRESOLINE) 100 MG tablet Take 100 mg by mouth 3 (three) times daily.   Yes Historical Provider, MD  lisinopril (PRINIVIL,ZESTRIL) 10 MG tablet Take 10 mg by mouth daily.   Yes Historical Provider, MD  metFORMIN (GLUCOPHAGE) 500 MG tablet Take 1 tablet by mouth daily. 01/12/14  Yes Historical Provider, MD  metoprolol (LOPRESSOR) 100 MG tablet Take 200 mg by mouth 2 (two) times daily.   Yes Historical Provider, MD   BP 112/65  Pulse 88  Temp(Src) 98 F (36.7 C) (Oral)  Resp 20  Ht 6' (1.829 m)  Wt 250 lb (113.399 kg)  BMI 33.90 kg/m2  SpO2 93% Physical Exam  Nursing note and vitals reviewed. Constitutional: He is oriented to person, place, and time. He appears well-developed. No distress.  Chronically ill appearing  HENT:  Head: Normocephalic and atraumatic.  Eyes: Conjunctivae and EOM are normal.  Cardiovascular: Normal rate and regular rhythm.   Pulmonary/Chest: Effort normal. No stridor. No respiratory distress.  Abdominal: He exhibits no distension.  Umbilical hernia, not distended, non-tender   Genitourinary:  Swollen scrotum   Musculoskeletal: He exhibits no edema.  Left leg below the knee amputation. Wrapping for chronic lymphodemia in the right leg, with superificial skin breakdown proximally about the lateral proximal lower leg. No spreading erythema, discharge or active bleeding   Neurological: He is alert and oriented to person, place, and time.  No pronator drift No gross cerebellar lesions   Skin: Skin is warm and dry.  Psychiatric: He has a  normal mood and affect.    ED Course  Procedures  DIAGNOSTIC STUDIES: Oxygen Saturation is 95% on ra, nml by my interpretation.    COORDINATION OF CARE: 1:15 PM Discussed treatment plan with pt at bedside and pt agreed to plan.   Labs Review Labs Reviewed  CBC WITH DIFFERENTIAL - Abnormal; Notable for the following:    WBC 10.6 (*)    Hemoglobin 11.3 (*)    HCT 34.6 (*)    MCV 66.2 (*)    MCH 21.6 (*)    RDW 17.4 (*)    Neutrophils Relative % 86 (*)    Neutro Abs 9.1 (*)    Lymphocytes Relative 5 (*)    Lymphs Abs 0.5 (*)    All other components within normal limits  COMPREHENSIVE METABOLIC PANEL - Abnormal; Notable for the following:    Glucose, Bld 227 (*)    BUN 43 (*)    Creatinine, Ser 2.51 (*)    Albumin 3.1 (*)    AST 133 (*)    GFR calc non Af Amer 24 (*)    GFR calc Af Amer 27 (*)    Anion gap 18 (*)    All other components within normal limits  URINALYSIS, ROUTINE W REFLEX MICROSCOPIC - Abnormal; Notable for the following:    APPearance HAZY (*)    Hgb urine dipstick LARGE (*)    Bilirubin Urine SMALL (*)    Protein, ur 100 (*)    Leukocytes, UA MODERATE (*)    All other components within normal limits  URINE MICROSCOPIC-ADD ON - Abnormal; Notable for the following:    Bacteria, UA MANY (*)    All other components within normal limits  I-STAT CG4 LACTIC ACID, ED - Abnormal; Notable for the following:    Lactic Acid, Venous 2.77 (*)    All other components within normal limits    Imaging Review Dg Chest 2 View  03/02/2014   CLINICAL DATA:  Weakness x1 week.  Unwitnessed fall.  Hypertension.  EXAM: CHEST  2 VIEW  COMPARISON:  06/12/2012.  FINDINGS: Mediastinum and hilar structures normal. Cardiomegaly, normal pulmonary vascularity. Subsegmental atelectasis right lung base. No pleural effusion or pneumothorax. No acute bony abnormality.  IMPRESSION: 1. Mild right base subsegmental axis. 2. Stable cardiomegaly, no CHF.  No acute cardiopulmonary disease.    Electronically Signed   By: Maisie Fushomas  Register   On: 03/02/2014 16:40   EKG 86, sinus rhythm, T-wave abnormalities abnormal   I personally performed the services described in this documentation, which was scribed in my presence. The recorded information has been reviewed and is accurate.   Patient presents with concern of weakness. This is a urinary tract infection, acute kidney injury. Patient is awake alert, but given the need for fluid  resuscitation, and antibiotics, his comorbidities, he was admitted for further evaluation and management.   Gerhard Munch, MD 03/02/14 [phone]

## 2014-03-03 DIAGNOSIS — M6282 Rhabdomyolysis: Principal | ICD-10-CM

## 2014-03-03 LAB — CBC
HCT: 30.3 % — ABNORMAL LOW (ref 39.0–52.0)
HEMOGLOBIN: 10 g/dL — AB (ref 13.0–17.0)
MCH: 21.7 pg — ABNORMAL LOW (ref 26.0–34.0)
MCHC: 33 g/dL (ref 30.0–36.0)
MCV: 65.9 fL — ABNORMAL LOW (ref 78.0–100.0)
Platelets: 222 10*3/uL (ref 150–400)
RBC: 4.6 MIL/uL (ref 4.22–5.81)
RDW: 17.4 % — AB (ref 11.5–15.5)
WBC: 9.4 10*3/uL (ref 4.0–10.5)

## 2014-03-03 LAB — GLUCOSE, CAPILLARY
GLUCOSE-CAPILLARY: 173 mg/dL — AB (ref 70–99)
Glucose-Capillary: 202 mg/dL — ABNORMAL HIGH (ref 70–99)
Glucose-Capillary: 164 mg/dL — ABNORMAL HIGH (ref 70–99)
Glucose-Capillary: 178 mg/dL — ABNORMAL HIGH (ref 70–99)
Glucose-Capillary: 162 mg/dL — ABNORMAL HIGH (ref 70–99)

## 2014-03-03 LAB — BASIC METABOLIC PANEL
ANION GAP: 15 (ref 5–15)
BUN: 46 mg/dL — ABNORMAL HIGH (ref 6–23)
CO2: 20 mEq/L (ref 19–32)
Calcium: 7.6 mg/dL — ABNORMAL LOW (ref 8.4–10.5)
Chloride: 109 mEq/L (ref 96–112)
Creatinine, Ser: 3.1 mg/dL — ABNORMAL HIGH (ref 0.50–1.35)
GFR, EST AFRICAN AMERICAN: 21 mL/min — AB (ref >90)
GFR, EST NON AFRICAN AMERICAN: 18 mL/min — AB (ref >90)
Glucose, Bld: 201 mg/dL — ABNORMAL HIGH (ref 70–99)
POTASSIUM: 3.9 meq/L (ref 3.7–5.3)
SODIUM: 144 meq/L (ref 137–147)

## 2014-03-03 LAB — CK: CK TOTAL: 26974 U/L — AB (ref 7–232)

## 2014-03-03 MED ORDER — AMLODIPINE BESYLATE 5 MG PO TABS
5.0000 mg | ORAL_TABLET | Freq: Every day | ORAL | Status: DC
  Administered 2014-03-03 – 2014-03-04 (×2): 5 mg via ORAL
  Filled 2014-03-03 (×2): qty 1

## 2014-03-03 MED ORDER — SODIUM CHLORIDE 0.9 % IV SOLN
INTRAVENOUS | Status: DC
  Administered 2014-03-03: 15:00:00 via INTRAVENOUS

## 2014-03-03 MED ORDER — SODIUM CHLORIDE 0.9 % IV BOLUS (SEPSIS)
250.0000 mL | Freq: Once | INTRAVENOUS | Status: AC
  Administered 2014-03-03: 250 mL via INTRAVENOUS

## 2014-03-03 MED ORDER — ASPIRIN EC 81 MG PO TBEC
81.0000 mg | DELAYED_RELEASE_TABLET | Freq: Every day | ORAL | Status: DC
  Administered 2014-03-03 – 2014-03-09 (×7): 81 mg via ORAL
  Filled 2014-03-03 (×7): qty 1

## 2014-03-03 NOTE — Progress Notes (Signed)
Patients urine output: 200ml for shift. MD was notified. MD had ordered a 250cc NS bolus. Patient received bolus @1744 . Will encourage patient to void throughout the remainder of the shift.

## 2014-03-03 NOTE — Progress Notes (Signed)
[facility] PROGRESS NOTE  Steven [name] ZOX:[id number] DOB: 31-Jul-1940 DOA: 03/02/2014 PCP: Milana Obey, MD  Assessment/Plan: 1. Acute renal failure secondary to Rhabdomyolisis 1. CK of just under 27k s/p fall and being on the ground for several days 2. 34cc urine noted on bladder scan  3. Cont on aggressive IVF as tolerated, monitoring resp function and eval for volume overload 2. DM2 1. Stable 2. On SSI 3. HTN 1. BP stable 2. Cont to monitor 4. DVT prophylaxis 1. Lovenox subQ 5. Scrotal swelling 1. Pt has a known hx of hydrocele, as seen on prior scrotal US over one year ago 2. Pt states this is a chronic problem and is not affected by it 3. Will order scrotal support and repeat scrotal US  Code Status: DNR Family Communication: Pt in room Disposition Plan: Pending   Consultants:  none  Procedures:  none  Antibiotics:  none (indicate start date, and stop date if known)  HPI/Subjective: No complaints. Feels better.  Objective: Filed Vitals:   03/02/14 2034 03/02/14 2220 03/03/14 0600 03/03/14 1459  BP: 116/61 119/57 103/55 108/45  Pulse: 88 90 71 74  Temp: 98.9 F (37.2 C) 99.2 F (37.3 C) 98.9 F (37.2 C) 99.5 F (37.5 C)  TempSrc: Oral Oral Oral Oral  Resp: 20 18 20 20   Height:      Weight:      SpO2: 95% 96% 94% 94%    Intake/Output Summary (Last 24 hours) at 03/03/14 1651 Last data filed at 03/02/14 1850  Gross per 24 hour  Intake    210 ml  Output      0 ml  Net    210 ml   Filed Weights   03/02/14 1213  Weight: 113.399 kg (250 lb)    Exam:   General:  Awake, in nad  Cardiovascular: regular,s 1, s2  Respiratory: normal resp effort, no wheezing  Abdomen: soft,nondistended  Musculoskeletal: s/p LLE amputation, perfused  GU: marked scrotal swelling   Data Reviewed: Basic Metabolic Panel:  Recent Labs Lab 03/02/14 1402 03/03/14 0532  NA 144 144  K 4.4 3.9  CL 105 109  CO2 21 20  GLUCOSE 227* 201*  BUN  43* 46*  CREATININE 2.51* 3.10*  CALCIUM 8.7 7.6*   Liver Function Tests:  Recent Labs Lab 03/02/14 1402  AST 133*  ALT 39  ALKPHOS 108  BILITOT 0.5  PROT 8.2  ALBUMIN 3.1*   No results found for this basename: LIPASE, AMYLASE,  in the last 168 hours No results found for this basename: AMMONIA,  in the last 168 hours CBC:  Recent Labs Lab 03/02/14 1402 03/03/14 0532  WBC 10.6* 9.4  NEUTROABS 9.1*  --   HGB 11.3* 10.0*  HCT 34.6* 30.3*  MCV 66.2* 65.9*  PLT 248 222   Cardiac Enzymes:  Recent Labs Lab 03/03/14 0532  CKTOTAL 81191*   BNP (last 3 results) No results found for this basename: PROBNP,  in the last 8760 hours CBG:  Recent Labs Lab 03/02/14 2147 03/03/14 0727 03/03/14 1113  GLUCAP 173* 202* 164*    No results found for this or any previous visit (from the past 240 hour(s)).   Studies: Dg Chest 2 View  03/02/2014   CLINICAL DATA:  Weakness x1 week.  Unwitnessed fall.  Hypertension.  EXAM: CHEST  2 VIEW  COMPARISON:  06/12/2012.  FINDINGS: Mediastinum and hilar structures normal. Cardiomegaly, normal pulmonary vascularity. Subsegmental atelectasis right lung base. No pleural effusion or pneumothorax. No acute  bony abnormality.  IMPRESSION: 1. Mild right base subsegmental axis. 2. Stable cardiomegaly, no CHF.  No acute cardiopulmonary disease.   Electronically Signed   By: Maisie Fushomas  Register   On: 03/02/2014 16:40    Scheduled Meds: . amLODipine  5 mg Oral Daily  . aspirin EC  81 mg Oral Daily  . cefTRIAXone (ROCEPHIN)  IV  1 g Intravenous Q24H  . doxazosin  4 mg Oral QHS  . enoxaparin (LOVENOX) injection  40 mg Subcutaneous Q24H  . hydrALAZINE  100 mg Oral TID  . Influenza vac split quadrivalent PF  0.5 mL Intramuscular Tomorrow-1000  . insulin aspart  0-15 Units Subcutaneous TID WC  . insulin aspart  0-5 Units Subcutaneous QHS  . metoprolol  200 mg Oral BID  . pneumococcal 23 valent vaccine  0.5 mL Intramuscular Tomorrow-1000  . sodium  chloride  250 mL Intravenous Once   Continuous Infusions: . sodium chloride 125 mL/hr at 03/03/14 1439    Active Problems:   Acute on chronic renal insufficiency   Dehydration   UTI (lower urinary tract infection)  Time spent: 35min  [name], [name]  [facility] Pager [phone]. If 7PM-7AM, please contact night-coverage at [email or web address], password [email or web address] 03/03/2014, 4:51 PM  LOS: 1 day

## 2014-03-03 NOTE — Progress Notes (Addendum)
Nurse in room with pt, assessing and administering medications. Upon skin assessment, bilateral elbows have pressure ulcers, and will place foam on them, bil. bil legs, under knees are opened wounds. Right wound bed is pink. Left wound bed is pink and red. RLE has dry & flaky skin. Skin very fragile. Pt states that its "dead skin". RLE is wrapped with a sleeve and brace put on by doctor, per patient. Superficial skin breakdown noted, but no drainage. Will place new dressings on bil wounds under knees. WIll cleanse and place non adherant dressing and surgical tape. Pt disoriented to place. [place] Placed bed in lowest position & call bell within reach. No complaints per pt at this time. Will continue to monitor pt throughout night.

## 2014-03-03 NOTE — Progress Notes (Signed)
Utilization review Completed Corey Laski RN BSN   

## 2014-03-04 ENCOUNTER — Inpatient Hospital Stay (HOSPITAL_COMMUNITY): Payer: Medicare Other

## 2014-03-04 DIAGNOSIS — N183 Chronic kidney disease, stage 3 (moderate): Secondary | ICD-10-CM

## 2014-03-04 DIAGNOSIS — I10 Essential (primary) hypertension: Secondary | ICD-10-CM

## 2014-03-04 LAB — GLUCOSE, CAPILLARY
GLUCOSE-CAPILLARY: 169 mg/dL — AB (ref 70–99)
GLUCOSE-CAPILLARY: 170 mg/dL — AB (ref 70–99)
GLUCOSE-CAPILLARY: 175 mg/dL — AB (ref 70–99)
Glucose-Capillary: 147 mg/dL — ABNORMAL HIGH (ref 70–99)
Glucose-Capillary: 146 mg/dL — ABNORMAL HIGH (ref 70–99)

## 2014-03-04 LAB — BASIC METABOLIC PANEL
ANION GAP: 16 — AB (ref 5–15)
BUN: 61 mg/dL — AB (ref 6–23)
CALCIUM: 7.7 mg/dL — AB (ref 8.4–10.5)
CHLORIDE: 107 meq/L (ref 96–112)
CO2: 18 mEq/L — ABNORMAL LOW (ref 19–32)
CREATININE: 4.5 mg/dL — AB (ref 0.50–1.35)
GFR calc non Af Amer: 12 mL/min — ABNORMAL LOW (ref >90)
GFR, EST AFRICAN AMERICAN: 14 mL/min — AB (ref >90)
Glucose, Bld: 151 mg/dL — ABNORMAL HIGH (ref 70–99)
Potassium: 3.7 mEq/L (ref 3.7–5.3)
Sodium: 141 mEq/L (ref 137–147)

## 2014-03-04 LAB — CK: Total CK: 15424 U/L — ABNORMAL HIGH (ref 7–232)

## 2014-03-04 MED ORDER — STERILE WATER FOR INJECTION IV SOLN
INTRAVENOUS | Status: DC
  Administered 2014-03-04 – 2014-03-05 (×3): via INTRAVENOUS
  Filled 2014-03-04 (×8): qty 9.7

## 2014-03-04 MED ORDER — FUROSEMIDE 10 MG/ML IJ SOLN
100.0000 mg | Freq: Two times a day (BID) | INTRAVENOUS | Status: DC
  Administered 2014-03-04 – 2014-03-05 (×2): 100 mg via INTRAVENOUS
  Filled 2014-03-04 (×4): qty 10

## 2014-03-04 NOTE — Progress Notes (Signed)
No scrotal support found in the facility at this time.

## 2014-03-04 NOTE — Progress Notes (Signed)
Notifed MD of patient's urine output.  Received order for an I &O cath.  I & O cath per md ordered.  300 cc of dark cloudy urine returned from patient.

## 2014-03-04 NOTE — Progress Notes (Signed)
[facility] PROGRESS NOTE  Steven [name] WUJ:[id number] DOB: 1941-02-08 DOA: 03/02/2014 PCP: Milana Obey, MD  Assessment/Plan: 1. Acute renal failure secondary to Rhabdomyolisis 1. Initial CK of just under 27k s/p fall and being on the ground for several days 2. Cont on aggressive IVF as tolerated, monitoring resp function and eval for volume overload 3. CK now 15k 4. Cr trending up 5. Cont IVF as tolerated and monitor closely 6. Have consulted Nephrology given worsening renal function with decreased urine output 2. DM2 1. Stable 2. On SSI 3. HTN 1. BP stable 2. Cont to monitor 4. DVT prophylaxis 1. Lovenox subQ 5. Scrotal swelling 1. Pt has a known hx of hydrocele, as seen on prior scrotal US over one year ago 2. Pt states this is a chronic problem and he is not affected by it 3. Repeat scrotal US pending  Code Status: DNR Family Communication: Pt in room Disposition Plan: Pending   Consultants:  none  Procedures:  none  Antibiotics:  none (indicate start date, and stop date if known)  HPI/Subjective: No complaints.  Objective: Filed Vitals:   03/03/14 1459 03/03/14 2210 03/03/14 2300 03/04/14 0627  BP: 108/45 110/40 108/45 107/52  Pulse: 74 71 68 66  Temp: 99.5 F (37.5 C) 100.8 F (38.2 C) 99.2 F (37.3 C) 99.1 F (37.3 C)  TempSrc: Oral Oral  Oral  Resp: 20 22  18   Height:      Weight:      SpO2: 94% 96%  97%    Intake/Output Summary (Last 24 hours) at 03/04/14 1007 Last data filed at 03/04/14 0700  Gross per 24 hour  Intake 489.17 ml  Output    650 ml  Net -160.83 ml   Filed Weights   03/02/14 1213  Weight: 113.399 kg (250 lb)    Exam:   General:  Awake, in nad  Cardiovascular: regular,s 1, s2  Respiratory: normal resp effort, no wheezing  Abdomen: soft,nondistended  Musculoskeletal: s/p LLE amputation, perfused  GU: marked scrotal swelling, nontender  Data Reviewed: Basic Metabolic Panel:  Recent  Labs Lab 03/02/14 1402 03/03/14 0532 03/04/14 0606  NA 144 144 141  [name] 4.4 3.9 3.7  CL 105 109 107  CO2 21 20 18*  GLUCOSE 227* 201* 151*  BUN 43* 46* 61*  CREATININE 2.51* 3.10* 4.50*  CALCIUM 8.7 7.6* 7.7*   Liver Function Tests:  Recent Labs Lab 03/02/14 1402  AST 133*  ALT 39  ALKPHOS 108  BILITOT 0.5  PROT 8.2  ALBUMIN 3.1*   No results found for this basename: LIPASE, AMYLASE,  in the last 168 hours No results found for this basename: AMMONIA,  in the last 168 hours CBC:  Recent Labs Lab 03/02/14 1402 03/03/14 0532  WBC 10.6* 9.4  NEUTROABS 9.1*  --   HGB 11.3* 10.0*  HCT 34.6* 30.3*  MCV 66.2* 65.9*  PLT 248 222   Cardiac Enzymes:  Recent Labs Lab 03/03/14 0532 03/04/14 0606  CKTOTAL 95621* 15424*   BNP (last 3 results) No results found for this basename: PROBNP,  in the last 8760 hours CBG:  Recent Labs Lab 03/03/14 1113 03/03/14 1724 03/03/14 2134 03/04/14 0041 03/04/14 0736  GLUCAP 164* 178* 162* 175* 147*    No results found for this or any previous visit (from the past 240 hour(s)).   Studies: Dg Chest 2 View  03/02/2014   CLINICAL DATA:  Weakness x1 week.  Unwitnessed fall.  Hypertension.  EXAM: CHEST  2 VIEW  COMPARISON:  06/12/2012.  FINDINGS: Mediastinum and hilar structures normal. Cardiomegaly, normal pulmonary vascularity. Subsegmental atelectasis right lung base. No pleural effusion or pneumothorax. No acute bony abnormality.  IMPRESSION: 1. Mild right base subsegmental axis. 2. Stable cardiomegaly, no CHF.  No acute cardiopulmonary disease.   Electronically Signed   By: Maisie Fushomas  Register   On: 03/02/2014 16:40    Scheduled Meds: . amLODipine  5 mg Oral Daily  . aspirin EC  81 mg Oral Daily  . cefTRIAXone (ROCEPHIN)  IV  1 g Intravenous Q24H  . doxazosin  4 mg Oral QHS  . enoxaparin (LOVENOX) injection  40 mg Subcutaneous Q24H  . hydrALAZINE  100 mg Oral TID  . Influenza vac split quadrivalent PF  0.5 mL Intramuscular  Tomorrow-1000  . insulin aspart  0-15 Units Subcutaneous TID WC  . insulin aspart  0-5 Units Subcutaneous QHS  . metoprolol  200 mg Oral BID  . pneumococcal 23 valent vaccine  0.5 mL Intramuscular Tomorrow-1000   Continuous Infusions: . sodium chloride 125 mL/hr at 03/03/14 1439    Active Problems:   Acute on chronic renal insufficiency   Dehydration   UTI (lower urinary tract infection)  Time spent: 35min  Steven [name]  [facility] Pager [phone]. If 7PM-7AM, please contact night-coverage at [email or web address], password [email or web address] 03/04/2014, 10:07 AM  LOS: 2 days

## 2014-03-04 NOTE — Consult Note (Signed)
Reason for Consult: Acute kidney injury Referring Physician: Dr. Almyra Brace [name] is an 73 y.o. male.  HPI: Patient with history of diabetes, disease, peripheral vascular disease status post left BKA in history of her chronic renal failure presently came with complaints of fever, chills, severe weakness and history of fall . Patient fall down at home 3 times while trying to transfer himself from his regular chair to wheelchair. According to patient the first time he fall down he called 911 after staying on the floor for more than 7 hours. The next day the same thing happens and up on the floor for another 10 hours. The last one happened around 3 AM  And he was brought  to the emergency room from where he is admitted to the hospital. Patient states that he has bruise on his elbows and right knee  while he was crawling on the floor to get up. During his evaluation  Patient was found to have acute kidney injury and rhabdomyolysis . Presently patient denies any nausea or vomiting. Still he has some weakness but denies any muscle pain. Patient also complains of cough with whitish sputum.  Past Medical History  Diagnosis Date  . Amputated below knee   . Coronary artery disease   . Diabetes mellitus   . Hypertension   . Hypercholesteremia     Past Surgical History  Procedure Laterality Date  . Amputation      No family history on file.  Social History:  reports that he has quit smoking. He does not have any smokeless tobacco history on file. He reports that he does not drink alcohol or use illicit drugs.  Allergies:  Allergies  Allergen Reactions  . Amlodipine Besylate-Valsartan     REACTION: renal deterioration (Patient does not recall this allergy. This medication is BRAND NAME EXFORGE which includes NORVASC (Amlodipine) which patient takes daily. Patient is not aware of the reaction, the medication listed, or the allergy itself. Advised patient to verify this medication allergy and  reaction with physician.    Medications: I have reviewed the patient's current medications.  Results for orders placed during the hospital encounter of 03/02/14 (from the past 48 hour(s))  CBC WITH DIFFERENTIAL     Status: Abnormal   Collection Time    03/02/14  2:02 PM      Result Value Ref Range   WBC 10.6 (*) 4.0 - 10.5 K/uL   RBC 5.23  4.22 - 5.81 MIL/uL   Hemoglobin 11.3 (*) 13.0 - 17.0 g/dL   HCT 34.6 (*) 39.0 - 52.0 %   MCV 66.2 (*) 78.0 - 100.0 fL   MCH 21.6 (*) 26.0 - 34.0 pg   MCHC 32.7  30.0 - 36.0 g/dL   RDW 17.4 (*) 11.5 - 15.5 %   Platelets 248  150 - 400 K/uL   Neutrophils Relative % 86 (*) 43 - 77 %   Neutro Abs 9.1 (*) 1.7 - 7.7 K/uL   Lymphocytes Relative 5 (*) 12 - 46 %   Lymphs Abs 0.5 (*) 0.7 - 4.0 K/uL   Monocytes Relative 9  3 - 12 %   Monocytes Absolute 1.0  0.1 - 1.0 K/uL   Eosinophils Relative 0  0 - 5 %   Eosinophils Absolute 0.0  0.0 - 0.7 K/uL   Basophils Relative 0  0 - 1 %   Basophils Absolute 0.0  0.0 - 0.1 K/uL   Smear Review MORPHOLOGY UNREMARKABLE    COMPREHENSIVE METABOLIC PANEL  Status: Abnormal   Collection Time    03/02/14  2:02 PM      Result Value Ref Range   Sodium 144  137 - 147 mEq/L   Potassium 4.4  3.7 - 5.3 mEq/L   Chloride 105  96 - 112 mEq/L   CO2 21  19 - 32 mEq/L   Glucose, Bld 227 (*) 70 - 99 mg/dL   BUN 43 (*) 6 - 23 mg/dL   Creatinine, Ser 2.51 (*) 0.50 - 1.35 mg/dL   Calcium 8.7  8.4 - 10.5 mg/dL   Total Protein 8.2  6.0 - 8.3 g/dL   Albumin 3.1 (*) 3.5 - 5.2 g/dL   AST 133 (*) 0 - 37 U/L   ALT 39  0 - 53 U/L   Alkaline Phosphatase 108  39 - 117 U/L   Total Bilirubin 0.5  0.3 - 1.2 mg/dL   GFR calc non Af Amer 24 (*) >90 mL/min   GFR calc Af Amer 27 (*) >90 mL/min   Comment: (NOTE)     The eGFR has been calculated using the CKD EPI equation.     This calculation has not been validated in all clinical situations.     eGFR's persistently <90 mL/min signify possible Chronic Kidney     Disease.   Anion gap 18  (*) 5 - 15  I-STAT CG4 LACTIC ACID, ED     Status: Abnormal   Collection Time    03/02/14  2:16 PM      Result Value Ref Range   Lactic Acid, Venous 2.77 (*) 0.5 - 2.2 mmol/L  URINALYSIS, ROUTINE W REFLEX MICROSCOPIC     Status: Abnormal   Collection Time    03/02/14  2:58 PM      Result Value Ref Range   Color, Urine YELLOW  YELLOW   APPearance HAZY (*) CLEAR   Specific Gravity, Urine 1.025  1.005 - 1.030   pH 5.0  5.0 - 8.0   Glucose, UA NEGATIVE  NEGATIVE mg/dL   Hgb urine dipstick LARGE (*) NEGATIVE   Bilirubin Urine SMALL (*) NEGATIVE   Ketones, ur NEGATIVE  NEGATIVE mg/dL   Protein, ur 100 (*) NEGATIVE mg/dL   Urobilinogen, UA 0.2  0.0 - 1.0 mg/dL   Nitrite NEGATIVE  NEGATIVE   Leukocytes, UA MODERATE (*) NEGATIVE  URINE MICROSCOPIC-ADD ON     Status: Abnormal   Collection Time    03/02/14  2:58 PM      Result Value Ref Range   Squamous Epithelial / LPF RARE  RARE   WBC, UA TOO NUMEROUS TO COUNT  <3 WBC/hpf   RBC / HPF 21-50  <3 RBC/hpf   Bacteria, UA MANY (*) RARE  GLUCOSE, CAPILLARY     Status: Abnormal   Collection Time    03/02/14  9:47 PM      Result Value Ref Range   Glucose-Capillary 173 (*) 70 - 99 mg/dL   Comment 1 Notify RN     Comment 2 Documented in Chart    CBC     Status: Abnormal   Collection Time    03/03/14  5:32 AM      Result Value Ref Range   WBC 9.4  4.0 - 10.5 K/uL   RBC 4.60  4.22 - 5.81 MIL/uL   Hemoglobin 10.0 (*) 13.0 - 17.0 g/dL   HCT 30.3 (*) 39.0 - 52.0 %   MCV 65.9 (*) 78.0 - 100.0 fL   MCH 21.7 (*)  26.0 - 34.0 pg   MCHC 33.0  30.0 - 36.0 g/dL   RDW 17.4 (*) 11.5 - 15.5 %   Platelets 222  150 - 400 K/uL  BASIC METABOLIC PANEL     Status: Abnormal   Collection Time    03/03/14  5:32 AM      Result Value Ref Range   Sodium 144  137 - 147 mEq/L   Potassium 3.9  3.7 - 5.3 mEq/L   Chloride 109  96 - 112 mEq/L   CO2 20  19 - 32 mEq/L   Glucose, Bld 201 (*) 70 - 99 mg/dL   BUN 46 (*) 6 - 23 mg/dL   Creatinine, Ser 3.10 (*) 0.50  - 1.35 mg/dL   Calcium 7.6 (*) 8.4 - 10.5 mg/dL   GFR calc non Af Amer 18 (*) >90 mL/min   GFR calc Af Amer 21 (*) >90 mL/min   Comment: (NOTE)     The eGFR has been calculated using the CKD EPI equation.     This calculation has not been validated in all clinical situations.     eGFR's persistently <90 mL/min signify possible Chronic Kidney     Disease.   Anion gap 15  5 - 15  CK     Status: Abnormal   Collection Time    03/03/14  5:32 AM      Result Value Ref Range   Total CK 95638 (*) 7 - 232 U/L   Comment: RESULTS CONFIRMED BY MANUAL DILUTION  GLUCOSE, CAPILLARY     Status: Abnormal   Collection Time    03/03/14  7:27 AM      Result Value Ref Range   Glucose-Capillary 202 (*) 70 - 99 mg/dL  GLUCOSE, CAPILLARY     Status: Abnormal   Collection Time    03/03/14 11:13 AM      Result Value Ref Range   Glucose-Capillary 164 (*) 70 - 99 mg/dL  GLUCOSE, CAPILLARY     Status: Abnormal   Collection Time    03/03/14  5:24 PM      Result Value Ref Range   Glucose-Capillary 178 (*) 70 - 99 mg/dL  GLUCOSE, CAPILLARY     Status: Abnormal   Collection Time    03/03/14  9:34 PM      Result Value Ref Range   Glucose-Capillary 162 (*) 70 - 99 mg/dL  GLUCOSE, CAPILLARY     Status: Abnormal   Collection Time    03/04/14 12:41 AM      Result Value Ref Range   Glucose-Capillary 175 (*) 70 - 99 mg/dL  CK     Status: Abnormal   Collection Time    03/04/14  6:06 AM      Result Value Ref Range   Total CK [address] (*) 7 - 232 U/L  BASIC METABOLIC PANEL     Status: Abnormal   Collection Time    03/04/14  6:06 AM      Result Value Ref Range   Sodium 141  137 - 147 mEq/L   Potassium 3.7  3.7 - 5.3 mEq/L   Chloride 107  96 - 112 mEq/L   CO2 18 (*) 19 - 32 mEq/L   Glucose, Bld 151 (*) 70 - 99 mg/dL   BUN 61 (*) 6 - 23 mg/dL   Creatinine, Ser 4.50 (*) 0.50 - 1.35 mg/dL   Calcium 7.7 (*) 8.4 - 10.5 mg/dL   GFR calc non Af Amer 12 (*) >  90 mL/min   GFR calc Af Amer 14 (*) >90 mL/min    Comment: (NOTE)     The eGFR has been calculated using the CKD EPI equation.     This calculation has not been validated in all clinical situations.     eGFR's persistently <90 mL/min signify possible Chronic Kidney     Disease.   Anion gap 16 (*) 5 - 15  GLUCOSE, CAPILLARY     Status: Abnormal   Collection Time    03/04/14  7:36 AM      Result Value Ref Range   Glucose-Capillary 147 (*) 70 - 99 mg/dL    Dg Chest 2 View  03/02/2014   CLINICAL DATA:  Weakness x1 week.  Unwitnessed fall.  Hypertension.  EXAM: CHEST  2 VIEW  COMPARISON:  06/12/2012.  FINDINGS: Mediastinum and hilar structures normal. Cardiomegaly, normal pulmonary vascularity. Subsegmental atelectasis right lung base. No pleural effusion or pneumothorax. No acute bony abnormality.  IMPRESSION: 1. Mild right base subsegmental axis. 2. Stable cardiomegaly, no CHF.  No acute cardiopulmonary disease.   Electronically Signed   By: Marcello Moores  Register   On: 03/02/2014 16:40    Review of Systems  Constitutional: Positive for fever and chills.  Respiratory: Positive for cough and sputum production.   Cardiovascular: Negative for orthopnea and leg swelling.  Gastrointestinal: Negative for nausea, vomiting and abdominal pain.  Musculoskeletal: Positive for falls.  Neurological: Positive for weakness. Negative for dizziness.   Blood pressure 107/52, pulse 66, temperature 99.1 F (37.3 C), temperature source Oral, resp. rate 18, height 6' (1.829 m), weight 113.399 kg (250 lb), SpO2 97.00%. Physical Exam  Constitutional: No distress.  HENT:  Mouth/Throat: No oropharyngeal exudate.  Eyes: No scleral icterus.  Neck: No JVD present.  Cardiovascular: Normal rate and regular rhythm.   No murmur heard. Respiratory: No respiratory distress. He has no wheezes.  GI: He exhibits no distension. There is no tenderness. There is no rebound.  Musculoskeletal: He exhibits no edema.  Neurological: He is alert.  Skin:  Bruise on his elbows  bilaterally and also on his right knee from crowling on the flour    Assessment/Plan: Problem #1 acute kidney injury: Patient with elevated CPK, oliguric and acute kidney injury. He seems to be secondary to rhabdomyolysis. As this moment prerenal syndrome and ATN from other causes can't her ruled out. Patient also on ACE inhibitor as an outpatient possibly that might have contributed. Problem #2 chronic renal failure: His creatinine was 1.6  03/11/2007. His creatinine remains stable since then. His last creatinine was 1.74 with a GFR of 43 cc per minute 06/15/2012. Hence stage III. Etiology could be secondary to diabetes/hypertension/ischemic. Problem #3 diabetes Problem #4 hypertension: His systolic blood pressure is low normal. Problem #5 metabolic acidosis: Possibly associated with her Problem #6 anemia Problem #7 peripheral vascular disease: Status post left BKA Problem #8 history of CVA Plan:  we'll DC normal saline We'll start patient 1/4 saline with 100 mEq of sodium bicarbonate at 145 cc per hour Will start patient on Lasix 100 mg IV twice a day We'll check serum and urine myoglobin We'll check his UA in the morning We'll check his basic metabolic panel and phosphorus in the morning Steven [name] S 03/04/2014, 10:29 AM

## 2014-03-05 LAB — URINALYSIS, ROUTINE W REFLEX MICROSCOPIC
Bilirubin Urine: NEGATIVE
Glucose, UA: NEGATIVE mg/dL
Ketones, ur: NEGATIVE mg/dL
LEUKOCYTES UA: NEGATIVE
Nitrite: NEGATIVE
PROTEIN: NEGATIVE mg/dL
Specific Gravity, Urine: <1.005 — ABNORMAL LOW (ref 1.005–1.030)
UROBILINOGEN UA: 0.2 mg/dL (ref 0.0–1.0)
pH: 6 (ref 5.0–8.0)

## 2014-03-05 LAB — PHOSPHORUS: Phosphorus: 6.2 mg/dL — ABNORMAL HIGH (ref 2.3–4.6)

## 2014-03-05 LAB — GLUCOSE, CAPILLARY
GLUCOSE-CAPILLARY: 117 mg/dL — AB (ref 70–99)
Glucose-Capillary: 144 mg/dL — ABNORMAL HIGH (ref 70–99)
Glucose-Capillary: 171 mg/dL — ABNORMAL HIGH (ref 70–99)
Glucose-Capillary: 181 mg/dL — ABNORMAL HIGH (ref 70–99)

## 2014-03-05 LAB — BASIC METABOLIC PANEL
ANION GAP: 15 (ref 5–15)
BUN: 63 mg/dL — AB (ref 6–23)
CO2: 21 mEq/L (ref 19–32)
Calcium: 7.8 mg/dL — ABNORMAL LOW (ref 8.4–10.5)
Chloride: 105 mEq/L (ref 96–112)
Creatinine, Ser: 4.64 mg/dL — ABNORMAL HIGH (ref 0.50–1.35)
GFR calc Af Amer: 13 mL/min — ABNORMAL LOW (ref >90)
GFR calc non Af Amer: 11 mL/min — ABNORMAL LOW (ref >90)
GLUCOSE: 153 mg/dL — AB (ref 70–99)
Potassium: 3.5 mEq/L — ABNORMAL LOW (ref 3.7–5.3)
Sodium: 141 mEq/L (ref 137–147)

## 2014-03-05 LAB — URINE MICROSCOPIC-ADD ON

## 2014-03-05 LAB — CK: Total CK: 11562 U/L — ABNORMAL HIGH (ref 7–232)

## 2014-03-05 MED ORDER — POTASSIUM CHLORIDE CRYS ER 20 MEQ PO TBCR
30.0000 meq | EXTENDED_RELEASE_TABLET | Freq: Two times a day (BID) | ORAL | Status: DC
  Administered 2014-03-05 (×2): 30 meq via ORAL
  Filled 2014-03-05 (×6): qty 1

## 2014-03-05 NOTE — Progress Notes (Signed)
[facility] PROGRESS NOTE  Raymon Schlarb ZOX:[id number] DOB: [birth year] DOA: 03/02/2014 PCP: Milana Obey, MD  Assessment/Plan: 1. Acute renal failure secondary to Rhabdomyolisis 1. Initial CK of just under 27k s/p fall and being on the ground for several days 2. Cont on aggressive IVF as tolerated, monitoring resp function and eval for volume overload 3. CK now 11k 4. Cr continuing to slowly trend up 5. Cont IVF as tolerated and monitor closely 6. Nephrology was consulted given worsening renal function with decreased urine output 7. Currently on lasix gtt with IVF with bicarb per Nephrology recs 2. DM2 1. Stable 2. On SSI 3. HTN 1. BP stable 2. Cont to monitor 4. DVT prophylaxis 1. Lovenox subQ 5. Scrotal swelling 1. Pt has a known hx of hydrocele, as seen on prior scrotal US over one year ago 2. Pt states this is a chronic problem and he is not affected by it 3. Repeat scrotal US showing stable hydrocele  Code Status: DNR Family Communication: Pt in room Disposition Plan: Pending  Consultants:  none  Procedures:  none  Antibiotics:  none  HPI/Subjective: No complaints.  Objective: Filed Vitals:   03/04/14 1449 03/04/14 2137 03/04/14 2229 03/05/14 0531  BP: 117/51 130/54 114/63 124/58  Pulse: 70 75 76 77  Temp: 99.2 F (37.3 C)  98.6 F (37 C) 98.9 F (37.2 C)  TempSrc: Oral  Oral Oral  Resp: 20  20 20   Height:      Weight:      SpO2: 97%  93% 96%    Intake/Output Summary (Last 24 hours) at 03/05/14 1228 Last data filed at 03/05/14 0918  Gross per 24 hour  Intake     60 ml  Output   3900 ml  Net  -3840 ml   Filed Weights   03/02/14 1213  Weight: 113.399 kg (250 lb)    Exam:  General:  Awake, in nad  Cardiovascular: regular,s 1, s2  Respiratory: normal resp effort, no wheezing  Abdomen: soft,nondistended  Musculoskeletal: s/p LLE amputation, perfused  GU: marked scrotal swelling, nontender  Data Reviewed: Basic  Metabolic Panel:  Recent Labs Lab 03/02/14 1402 03/03/14 0532 03/04/14 0606 03/05/14 0530  NA 144 144 141 141  K 4.4 3.9 3.7 3.5*  CL 105 109 107 105  CO2 21 20 18* 21  GLUCOSE 227* 201* 151* 153*  BUN 43* 46* 61* 63*  CREATININE 2.51* 3.10* 4.50* 4.64*  CALCIUM 8.7 7.6* 7.7* 7.8*  PHOS  --   --   --  6.2*   Liver Function Tests:  Recent Labs Lab 03/02/14 1402  AST 133*  ALT 39  ALKPHOS 108  BILITOT 0.5  PROT 8.2  ALBUMIN 3.1*   No results found for this basename: LIPASE, AMYLASE,  in the last 168 hours No results found for this basename: AMMONIA,  in the last 168 hours CBC:  Recent Labs Lab 03/02/14 1402 03/03/14 0532  WBC 10.6* 9.4  NEUTROABS 9.1*  --   HGB 11.3* 10.0*  HCT 34.6* 30.3*  MCV 66.2* 65.9*  PLT 248 222   Cardiac Enzymes:  Recent Labs Lab 03/03/14 0532 03/04/14 0606 03/05/14 0530  CKTOTAL 81191* 15424* 11562*   BNP (last 3 results) No results found for this basename: PROBNP,  in the last 8760 hours CBG:  Recent Labs Lab 03/04/14 1110 03/04/14 1624 03/04/14 2056 03/05/14 0819 03/05/14 1128  GLUCAP 169* 146* 170* 144* 171*    No results found for this or any previous visit (  from the past 240 hour(s)).   Studies: [place] Scrotum  03/04/2014   CLINICAL DATA:  Known hydroceles; assess for change  EXAM: SCROTAL ULTRASOUND  DOPPLER ULTRASOUND OF THE TESTICLES  TECHNIQUE: Complete ultrasound examination of the testicles, epididymis, and other scrotal structures was performed. Color and spectral Doppler ultrasound were also utilized to evaluate blood flow to the testicles.  COMPARISON:  Scrotal ultrasound of June 14, 2013.  FINDINGS: Right testicle  Measurements: 4.8 x 2.7 x 2.9 cm. No mass or microlithiasis visualized.  Left testicle  Measurements: 3.9 x 2.5 x 2.4 cm. No mass or microlithiasis visualized.  Right epididymis:  1.8 x 1.1 x 1.3 cm.  Left epididymis: 1.7 x 1.4 x 1.5 cm .There is a stable appearing complex epididymal cyst on the  left measuring 6.5 mm. This previously measured 6 mm.  Hydrocele:  Bilateral hydroceles right greater than left.  Varicocele:  None visualized.  Pulsed Doppler interrogation of both testes demonstrates low resistance arterial and venous waveforms bilaterally.  IMPRESSION: 1. The testes exhibit normal echotexture and vascularity. There is no evidence of torsion. 2. There is a stable epididymal cyst on the left. The right epididymis is normal. 3. There is stable large hydroceles greater on the right than on the left. Septations are noted in the right-sided hydrocele.   Electronically Signed   By: David  [place]   On: 03/04/2014 11:17   [place] Art/ven Flow Abd Pelv Doppler  03/04/2014   CLINICAL DATA:  Known hydroceles; assess for change  EXAM: SCROTAL ULTRASOUND  DOPPLER ULTRASOUND OF THE TESTICLES  TECHNIQUE: Complete ultrasound examination of the testicles, epididymis, and other scrotal structures was performed. Color and spectral Doppler ultrasound were also utilized to evaluate blood flow to the testicles.  COMPARISON:  Scrotal ultrasound of June 14, 2013.  FINDINGS: Right testicle  Measurements: 4.8 x 2.7 x 2.9 cm. No mass or microlithiasis visualized.  Left testicle  Measurements: 3.9 x 2.5 x 2.4 cm. No mass or microlithiasis visualized.  Right epididymis:  1.8 x 1.1 x 1.3 cm.  Left epididymis: 1.7 x 1.4 x 1.5 cm .There is a stable appearing complex epididymal cyst on the left measuring 6.5 mm. This previously measured 6 mm.  Hydrocele:  Bilateral hydroceles right greater than left.  Varicocele:  None visualized.  Pulsed Doppler interrogation of both testes demonstrates low resistance arterial and venous waveforms bilaterally.  IMPRESSION: 1. The testes exhibit normal echotexture and vascularity. There is no evidence of torsion. 2. There is a stable epididymal cyst on the left. The right epididymis is normal. 3. There is stable large hydroceles greater on the right than on the left. Septations are noted in the  right-sided hydrocele.   Electronically Signed   By: David  [place]   On: 03/04/2014 11:17    Scheduled Meds: . aspirin EC  81 mg Oral Daily  . cefTRIAXone (ROCEPHIN)  IV  1 g Intravenous Q24H  . doxazosin  4 mg Oral QHS  . enoxaparin (LOVENOX) injection  40 mg Subcutaneous Q24H  . furosemide  100 mg Intravenous BID  . hydrALAZINE  100 mg Oral TID  . Influenza vac split quadrivalent PF  0.5 mL Intramuscular Tomorrow-1000  . insulin aspart  0-15 Units Subcutaneous TID WC  . insulin aspart  0-5 Units Subcutaneous QHS  . metoprolol  200 mg Oral BID  . pneumococcal 23 valent vaccine  0.5 mL Intramuscular Tomorrow-1000   Continuous Infusions: .  sodium bicarbonate infusion 1/4 NS 1000 mL 145 mL/hr at  03/04/14 1344    Active Problems:   Acute on chronic renal insufficiency   Dehydration   UTI (lower urinary tract infection)  Time spent:  Sakari Raisanen K  [facility] Pager [phone]. If 7PM-7AM, please contact night-coverage at [email or web address], password [facility] 03/05/2014, 12:28 PM  LOS: 3 days

## 2014-03-05 NOTE — Progress Notes (Signed)
Subjective: Interval History: has no complaint of complaints of nausea or vomiting. He denies also any difficulty breathing.  Objective: Vital signs in last 24 hours: Temp:  [98.6 F (37 C)-99.2 F (37.3 C)] 98.6 F (37 C) (10/05 1328) Pulse Rate:  [66-77] 66 (10/05 1328) Resp:  [18-20] 18 (10/05 1328) BP: (108-130)/(42-63) 108/42 mmHg (10/05 1328) SpO2:  [93 %-97 %] 95 % (10/05 1328) Weight change:   Intake/Output from previous day: 10/04 0701 - 10/05 0700 In: -  Out: 2450 [Urine:2450] Intake/Output this shift: Total I/O In: 1220 [I.V.:1160; IV Piggyback:60] Out: 2450 [Urine:2450]  General appearance: alert, cooperative and no distress Resp: clear to auscultation bilaterally Cardio: regular rate and rhythm, S1, S2 normal, no murmur, click, rub or gallop GI: soft, non-tender; bowel sounds normal; no masses,  no organomegaly Extremities: extremities normal, atraumatic, no cyanosis or edema  Lab Results:  Recent Labs  03/03/14 0532  WBC 9.4  HGB 10.0*  HCT 30.3*  PLT 222   BMET:  Recent Labs  03/04/14 0606 03/05/14 0530  NA 141 141  K 3.7 3.5*  CL 107 105  CO2 18* 21  GLUCOSE 151* 153*  BUN 61* 63*  CREATININE 4.50* 4.64*  CALCIUM 7.7* 7.8*   No results found for this basename: PTH,  in the last 72 hours Iron Studies: No results found for this basename: IRON, TIBC, TRANSFERRIN, FERRITIN,  in the last 72 hours  Studies/Results: US Scrotum  03/04/2014   CLINICAL DATA:  Known hydroceles; assess for change  EXAM: SCROTAL ULTRASOUND  DOPPLER ULTRASOUND OF THE TESTICLES  TECHNIQUE: Complete ultrasound examination of the testicles, epididymis, and other scrotal structures was performed. Color and spectral Doppler ultrasound were also utilized to evaluate blood flow to the testicles.  COMPARISON:  Scrotal ultrasound of June 14, 2013.  FINDINGS: Right testicle  Measurements: 4.8 x 2.7 x 2.9 cm. No mass or microlithiasis visualized.  Left testicle  Measurements: 3.9  x 2.5 x 2.4 cm. No mass or microlithiasis visualized.  Right epididymis:  1.8 x 1.1 x 1.3 cm.  Left epididymis: 1.7 x 1.4 x 1.5 cm .There is a stable appearing complex epididymal cyst on the left measuring 6.5 mm. This previously measured 6 mm.  Hydrocele:  Bilateral hydroceles right greater than left.  Varicocele:  None visualized.  Pulsed Doppler interrogation of both testes demonstrates low resistance arterial and venous waveforms bilaterally.  IMPRESSION: 1. The testes exhibit normal echotexture and vascularity. There is no evidence of torsion. 2. There is a stable epididymal cyst on the left. The right epididymis is normal. 3. There is stable large hydroceles greater on the right than on the left. Septations are noted in the right-sided hydrocele.   Electronically Signed   By: David  [place]   On: 03/04/2014 11:17   [place] Art/ven Flow Abd Pelv Doppler  03/04/2014   CLINICAL DATA:  Known hydroceles; assess for change  EXAM: SCROTAL ULTRASOUND  DOPPLER ULTRASOUND OF THE TESTICLES  TECHNIQUE: Complete ultrasound examination of the testicles, epididymis, and other scrotal structures was performed. Color and spectral Doppler ultrasound were also utilized to evaluate blood flow to the testicles.  COMPARISON:  Scrotal ultrasound of June 14, 2013.  FINDINGS: Right testicle  Measurements: 4.8 x 2.7 x 2.9 cm. No mass or microlithiasis visualized.  Left testicle  Measurements: 3.9 x 2.5 x 2.4 cm. No mass or microlithiasis visualized.  Right epididymis:  1.8 x 1.1 x 1.3 cm.  Left epididymis: 1.7 x 1.4 x 1.5 cm .There is  a stable appearing complex epididymal cyst on the left measuring 6.5 mm. This previously measured 6 mm.  Hydrocele:  Bilateral hydroceles right greater than left.  Varicocele:  None visualized.  Pulsed Doppler interrogation of both testes demonstrates low resistance arterial and venous waveforms bilaterally.  IMPRESSION: 1. The testes exhibit normal echotexture and vascularity. There is no evidence of  torsion. 2. There is a stable epididymal cyst on the left. The right epididymis is normal. 3. There is stable large hydroceles greater on the right than on the left. Septations are noted in the right-sided hydrocele.   Electronically Signed   By: David  [place]   On: 03/04/2014 11:17    I have reviewed the patient's current medications.  Assessment/Plan: Problem #1 acute kidney injury: His BUN and creatinine continued to increase. However the rate of increase has declined. Presently patient is none oliguric. Problem #2 rhabdomyolysis: His CPK is improving. Presently he doesn't have any muscle pain. Problem #3 diabetes  Problem #4 metabolic acidosis: Patient is on sodium bicarbonate CO2 is 21 and improving. Problem #5 anemia Problem #6 hypertension: His blood pressure seems to be reasonably controlled. Problem #7 History of CVA Problem #8 peripheral vascular disease Plan: We'll continue his present management Will start patient on KCl 30 mEq by mouth twice a day Check his basic metabolic panel and CPK in the morning.    LOS: 3 days   Eron Staat S 03/05/2014,2:34 PM

## 2014-03-05 NOTE — Care Management Note (Signed)
    Page 1 of 1   03/09/2014     6:00:25 PM CARE MANAGEMENT NOTE 03/09/2014  Patient:  Steven [name],Steven [name]   Account Number:  [id number]  Date Initiated:  03/05/2014  Documentation initiated by:  Anibal HendersonBOLDEN,Euna Armon  Subjective/Objective Assessment:   Admitted with UTI, RI, dehydration. he is from home, lives alone, is independent, and will return home at D/C. He is a Lt BKA, but has a prosthesis, and W/C and says he does fine "usually" but not so well when he gets sick, but has friends     Action/Plan:   and family who usually help if called.  Was down this time for several hours   May need life alert type monitor, or phone- [facility]- will follow   Anticipated DC Date:  03/09/2014   Anticipated DC Plan:  SKILLED NURSING FACILITY  In-house referral  Clinical Social Worker      DC Planning Services  CM consult      Choice offered to / List presented to:             Status of service:  Completed, signed off Medicare Important Message given?  YES (If response is "NO", the following Medicare IM given date fields will be blank) Date Medicare IM given:  03/09/2014 Medicare IM given by:  Anibal HendersonBOLDEN,Derika Eckles Date Additional Medicare IM given:   Additional Medicare IM given by:    Discharge Disposition:  SKILLED NURSING FACILITY  Per UR Regulation:  Reviewed for med. necessity/level of care/duration of stay  If discussed at Long Length of Stay Meetings, dates discussed:    Comments:  03/09/14 1755 Anibal HendersonGeneva Brezlyn Manrique RN/CM nPt very slow to progress, and he decided along with PT and OT that he needed SNF short term prior to returning home. Referred to CSW. Pt D/C to SNF today 03/05/14 1700 Anibal HendersonGeneva Nivea Wojdyla RN/CM

## 2014-03-06 LAB — CK TOTAL AND CKMB (NOT AT ARMC)
CK, MB: 11.5 ng/mL — AB (ref 0.3–4.0)
Relative Index: 0.1 (ref 0.0–2.5)
Total CK: 9834 U/L — ABNORMAL HIGH (ref 7–232)

## 2014-03-06 LAB — GLUCOSE, CAPILLARY
Glucose-Capillary: 115 mg/dL — ABNORMAL HIGH (ref 70–99)
Glucose-Capillary: 219 mg/dL — ABNORMAL HIGH (ref 70–99)
Glucose-Capillary: 153 mg/dL — ABNORMAL HIGH (ref 70–99)
Glucose-Capillary: 230 mg/dL — ABNORMAL HIGH (ref 70–99)

## 2014-03-06 LAB — BASIC METABOLIC PANEL
ANION GAP: 14 (ref 5–15)
BUN: 64 mg/dL — ABNORMAL HIGH (ref 6–23)
CHLORIDE: 99 meq/L (ref 96–112)
CO2: 26 mEq/L (ref 19–32)
CREATININE: 4.39 mg/dL — AB (ref 0.50–1.35)
Calcium: 8 mg/dL — ABNORMAL LOW (ref 8.4–10.5)
GFR, EST AFRICAN AMERICAN: 14 mL/min — AB (ref >90)
GFR, EST NON AFRICAN AMERICAN: 12 mL/min — AB (ref >90)
Glucose, Bld: 126 mg/dL — ABNORMAL HIGH (ref 70–99)
POTASSIUM: 3.7 meq/L (ref 3.7–5.3)
Sodium: 139 mEq/L (ref 137–147)

## 2014-03-06 LAB — CK: CK TOTAL: 5861 U/L — AB (ref 7–232)

## 2014-03-06 MED ORDER — ENOXAPARIN SODIUM 30 MG/0.3ML ~~LOC~~ SOLN
30.0000 mg | SUBCUTANEOUS | Status: DC
  Administered 2014-03-06 – 2014-03-08 (×3): 30 mg via SUBCUTANEOUS
  Filled 2014-03-06 (×3): qty 0.3

## 2014-03-06 MED ORDER — COLLAGENASE 250 UNIT/GM EX OINT
TOPICAL_OINTMENT | Freq: Every day | CUTANEOUS | Status: DC
  Administered 2014-03-06: 15:00:00 via TOPICAL
  Administered 2014-03-07: 1 via TOPICAL
  Administered 2014-03-08 – 2014-03-09 (×2): via TOPICAL
  Filled 2014-03-06: qty 30

## 2014-03-06 MED ORDER — SODIUM CHLORIDE 0.9 % IV SOLN
INTRAVENOUS | Status: DC
  Administered 2014-03-06 (×2): via INTRAVENOUS

## 2014-03-06 MED ORDER — POTASSIUM CHLORIDE CRYS ER 20 MEQ PO TBCR
20.0000 meq | EXTENDED_RELEASE_TABLET | Freq: Every day | ORAL | Status: DC
  Administered 2014-03-06 – 2014-03-09 (×4): 20 meq via ORAL
  Filled 2014-03-06 (×3): qty 1

## 2014-03-06 NOTE — Progress Notes (Signed)
Dressings changed as per wound nurse advised.

## 2014-03-06 NOTE — Progress Notes (Signed)
Dr. Rhona Leavenshiu in room to see patients wounds. Wound consult put in

## 2014-03-06 NOTE — Progress Notes (Signed)
CRITICAL VALUE ALERT  Critical value received:  CKMB  Date of notification:  03/06/2014  Time of notification:  0137    Critical value read back: Yes  Nurse who received alert:  Lenice LlamasJennifer Sayer Masini   MD notified (1st page):  Rito EhrlichKrishnan  Time of first page:  0137  MD notified (2nd page):  Time of second page:  Responding MD:    Time MD responded:

## 2014-03-06 NOTE — Progress Notes (Signed)
Subjective: Interval History: Patient offers no complain. Denies any nausea or vomiting  Objective: Vital signs in last 24 hours: Temp:  [98.1 F (36.7 C)-98.6 F (37 C)] 98.1 F (36.7 C) (10/06 0601) Pulse Rate:  [66-72] 69 (10/06 0601) Resp:  [18-20] 20 (10/06 0601) BP: (108-128)/(42-57) 114/48 mmHg (10/06 0601) SpO2:  [94 %-95 %] 95 % (10/06 0601) Weight change:   Intake/Output from previous day: 10/05 0701 - 10/06 0700 In: 4451.8 [P.O.:720; I.V.:3571.8; IV Piggyback:160] Out: 8450 [Urine:8450] Intake/Output this shift:    General appearance: alert, cooperative and no distress Resp: clear to auscultation bilaterally Cardio: regular rate and rhythm, S1, S2 normal, no murmur, click, rub or gallop GI: soft, non-tender; bowel sounds normal; no masses,  no organomegaly Extremities: extremities normal, atraumatic, no cyanosis or edema  Lab Results: No results found for this basename: WBC, HGB, HCT, PLT,  in the last 72 hours BMET:   Recent Labs  03/05/14 0530 03/06/14 0543  NA 141 139  K 3.5* 3.7  CL 105 99  CO2 21 26  GLUCOSE 153* 126*  BUN 63* 64*  CREATININE 4.64* 4.39*  CALCIUM 7.8* 8.0*   No results found for this basename: PTH,  in the last 72 hours Iron Studies: No results found for this basename: IRON, TIBC, TRANSFERRIN, FERRITIN,  in the last 72 hours  Studies/Results: [place] Scrotum  03/04/2014   CLINICAL DATA:  Known hydroceles; assess for change  EXAM: SCROTAL ULTRASOUND  DOPPLER ULTRASOUND OF THE TESTICLES  TECHNIQUE: Complete ultrasound examination of the testicles, epididymis, and other scrotal structures was performed. Color and spectral Doppler ultrasound were also utilized to evaluate blood flow to the testicles.  COMPARISON:  Scrotal ultrasound of June 14, 2013.  FINDINGS: Right testicle  Measurements: 4.8 x 2.7 x 2.9 cm. No mass or microlithiasis visualized.  Left testicle  Measurements: 3.9 x 2.5 x 2.4 cm. No mass or microlithiasis visualized.  Right  epididymis:  1.8 x 1.1 x 1.3 cm.  Left epididymis: 1.7 x 1.4 x 1.5 cm .There is a stable appearing complex epididymal cyst on the left measuring 6.5 mm. This previously measured 6 mm.  Hydrocele:  Bilateral hydroceles right greater than left.  Varicocele:  None visualized.  Pulsed Doppler interrogation of both testes demonstrates low resistance arterial and venous waveforms bilaterally.  IMPRESSION: 1. The testes exhibit normal echotexture and vascularity. There is no evidence of torsion. 2. There is a stable epididymal cyst on the left. The right epididymis is normal. 3. There is stable large hydroceles greater on the right than on the left. Septations are noted in the right-sided hydrocele.   Electronically Signed   By: David  [place]   On: 03/04/2014 11:17   [place] Art/ven Flow Abd Pelv Doppler  03/04/2014   CLINICAL DATA:  Known hydroceles; assess for change  EXAM: SCROTAL ULTRASOUND  DOPPLER ULTRASOUND OF THE TESTICLES  TECHNIQUE: Complete ultrasound examination of the testicles, epididymis, and other scrotal structures was performed. Color and spectral Doppler ultrasound were also utilized to evaluate blood flow to the testicles.  COMPARISON:  Scrotal ultrasound of June 14, 2013.  FINDINGS: Right testicle  Measurements: 4.8 x 2.7 x 2.9 cm. No mass or microlithiasis visualized.  Left testicle  Measurements: 3.9 x 2.5 x 2.4 cm. No mass or microlithiasis visualized.  Right epididymis:  1.8 x 1.1 x 1.3 cm.  Left epididymis: 1.7 x 1.4 x 1.5 cm .There is a stable appearing complex epididymal cyst on the left measuring 6.5 mm. This previously  measured 6 mm.  Hydrocele:  Bilateral hydroceles right greater than left.  Varicocele:  None visualized.  Pulsed Doppler interrogation of both testes demonstrates low resistance arterial and venous waveforms bilaterally.  IMPRESSION: 1. The testes exhibit normal echotexture and vascularity. There is no evidence of torsion. 2. There is a stable epididymal cyst on the left. The  right epididymis is normal. 3. There is stable large hydroceles greater on the right than on the left. Septations are noted in the right-sided hydrocele.   Electronically Signed   By: David  [place]   On: 03/04/2014 11:17    I have reviewed the patient's current medications.  Assessment/Plan: Problem #1 acute kidney injury: His BUN and creatinine started improving. This taught to be from ATN associated with rhabdomyslis/pre renal. Patient is polyuric on lasix Problem #2 rhabdomyolysis: His CPK is has come to 5861 and improving Problem #3 diabetes  Problem #4 metabolic acidosis: Patient is on sodium bicarbonate CO2 is 26 hence has improved Problem #5 anemia Problem #6 hypertension: His blood pressure seems to be reasonably controlled. Problem #7 History of CVA Problem #8 peripheral vascular disease Plan: We'll d/c lasix We will d/c sodium bicarbonate Change kcl to 20 meq once a day Basic metabolic panel and CPK in am Will start patient on KCl 30 mEq by mouth twice a day Check his basic metabolic panel and CPK in the morning.    LOS: 4 days   Bracha Frankowski S 03/06/2014,8:34 AM

## 2014-03-06 NOTE — Progress Notes (Addendum)
[facility] PROGRESS NOTE  Lars PinksJoseph Volkman ZOX:[id number] DOB: Mar 13, 1941 DOA: 03/02/2014 PCP: Milana ObeyKNOWLTON,Jaydee Conran D, MD  Off Service Summary: [phone] who presents with concerns of ARF. The patient was initially started on IVF. On history, it was learned that the patient had multiple falls several days prior to admission, where he essentially stayed on the ground until finally presenting to the ED. CK was noted to be around 27k. He was continued on aggressive IVF, however renal function continued to worsen and pt continued to have decreased urine output. Nephrology was then consulted with management as noted below. The patient's CK has been steadily improving and his renal function is now starting to improve as of 10/6.  Assessment/Plan: 1. Acute renal failure secondary to Rhabdomyolisis 1. Initial CK of just under 27k s/p fall and being on the ground for several days 2. Cont on aggressive IVF as tolerated, monitoring resp function and eval for volume overload 3. CK now 5.8k 4. Cr initially continued to trend up, but now down to 4.39 today 5. Cont IVF as tolerated and monitor closely 6. Nephrology was consulted given worsening renal function with decreased urine output 7. Pt was initially placed on lasix gtt with IVF with bicarb per Nephrology recs 8. Bicarb d/c'd today per Nephrology 2. DM2 1. Stable 2. On SSI 3. HTN 1. BP stable 2. Cont to monitor 4. DVT prophylaxis 1. Lovenox subQ 5. Scrotal swelling 1. Pt has a known hx of hydrocele, as seen on prior scrotal US over one year ago 2. Pt states this is a chronic problem and he is not affected by it 3. Repeat scrotal US showing stable hydrocele 4. Would observe for now  Code Status: DNR Family Communication: Pt in room Disposition Plan: Pending  Consultants:  none  Procedures:  none  Antibiotics:  none  HPI/Subjective: No complaints. No acute events noted overnight  Objective: Filed Vitals:   03/05/14 1328 03/05/14  2157 03/06/14 0601 03/06/14 0924  BP: 108/42 128/57 114/48 118/53  Pulse: 66 72 69 72  Temp: 98.6 F (37 C) 98.5 F (36.9 C) 98.1 F (36.7 C)   TempSrc:  Oral Oral   Resp: 18 20 20    Height:      Weight:      SpO2: 95% 94% 95%     Intake/Output Summary (Last 24 hours) at 03/06/14 1050 Last data filed at 03/06/14 0604  Gross per 24 hour  Intake 4151.83 ml  Output   7000 ml  Net -2848.17 ml   Filed Weights   03/02/14 1213  Weight: 113.399 kg (250 lb)    Exam:  General:  Awake, in nad  Cardiovascular: regular,s 1, s2  Respiratory: normal resp effort, no wheezing  Abdomen: soft,nondistended  Musculoskeletal: s/p LLE amputation, perfused  GU: marked scrotal swelling, nontender  Data Reviewed: Basic Metabolic Panel:  Recent Labs Lab 03/02/14 1402 03/03/14 0532 03/04/14 0606 03/05/14 0530 03/06/14 0543  NA 144 144 141 141 139  K 4.4 3.9 3.7 3.5* 3.7  CL 105 109 107 105 99  CO2 21 20 18* 21 26  GLUCOSE 227* 201* 151* 153* 126*  BUN 43* 46* 61* 63* 64*  CREATININE 2.51* 3.10* 4.50* 4.64* 4.39*  CALCIUM 8.7 7.6* 7.7* 7.8* 8.0*  PHOS  --   --   --  6.2*  --    Liver Function Tests:  Recent Labs Lab 03/02/14 1402  AST 133*  ALT 39  ALKPHOS 108  BILITOT 0.5  PROT 8.2  ALBUMIN 3.1*  No results found for this basename: LIPASE, AMYLASE,  in the last 168 hours No results found for this basename: AMMONIA,  in the last 168 hours CBC:  Recent Labs Lab 03/02/14 1402 03/03/14 0532  WBC 10.6* 9.4  NEUTROABS 9.1*  --   HGB 11.3* 10.0*  HCT 34.6* 30.3*  MCV 66.2* 65.9*  PLT 248 222   Cardiac Enzymes:  Recent Labs Lab 03/03/14 0532 03/04/14 0606 03/05/14 0530 03/05/14 1528 03/06/14 0543  CKTOTAL 54098* 15424* 11562* 9834* 5861*  CKMB  --   --   --  11.5*  --    BNP (last 3 results) No results found for this basename: PROBNP,  in the last 8760 hours CBG:  Recent Labs Lab 03/05/14 0819 03/05/14 1128 03/05/14 1625 03/05/14 2058  03/06/14 0836  GLUCAP 144* 171* 117* 181* 115*    No results found for this or any previous visit (from the past 240 hour(s)).   Studies: US Scrotum  03/04/2014   CLINICAL DATA:  Known hydroceles; assess for change  EXAM: SCROTAL ULTRASOUND  DOPPLER ULTRASOUND OF THE TESTICLES  TECHNIQUE: Complete ultrasound examination of the testicles, epididymis, and other scrotal structures was performed. Color and spectral Doppler ultrasound were also utilized to evaluate blood flow to the testicles.  COMPARISON:  Scrotal ultrasound of June 14, 2013.  FINDINGS: Right testicle  Measurements: 4.8 x 2.7 x 2.9 cm. No mass or microlithiasis visualized.  Left testicle  Measurements: 3.9 x 2.5 x 2.4 cm. No mass or microlithiasis visualized.  Right epididymis:  1.8 x 1.1 x 1.3 cm.  Left epididymis: 1.7 x 1.4 x 1.5 cm .There is a stable appearing complex epididymal cyst on the left measuring 6.5 mm. This previously measured 6 mm.  Hydrocele:  Bilateral hydroceles right greater than left.  Varicocele:  None visualized.  Pulsed Doppler interrogation of both testes demonstrates low resistance arterial and venous waveforms bilaterally.  IMPRESSION: 1. The testes exhibit normal echotexture and vascularity. There is no evidence of torsion. 2. There is a stable epididymal cyst on the left. The right epididymis is normal. 3. There is stable large hydroceles greater on the right than on the left. Septations are noted in the right-sided hydrocele.   Electronically Signed   By: David  [place]   On: 03/04/2014 11:17   [place] Art/ven Flow Abd Pelv Doppler  03/04/2014   CLINICAL DATA:  Known hydroceles; assess for change  EXAM: SCROTAL ULTRASOUND  DOPPLER ULTRASOUND OF THE TESTICLES  TECHNIQUE: Complete ultrasound examination of the testicles, epididymis, and other scrotal structures was performed. Color and spectral Doppler ultrasound were also utilized to evaluate blood flow to the testicles.  COMPARISON:  Scrotal ultrasound of June 14, 2013.  FINDINGS: Right testicle  Measurements: 4.8 x 2.7 x 2.9 cm. No mass or microlithiasis visualized.  Left testicle  Measurements: 3.9 x 2.5 x 2.4 cm. No mass or microlithiasis visualized.  Right epididymis:  1.8 x 1.1 x 1.3 cm.  Left epididymis: 1.7 x 1.4 x 1.5 cm .There is a stable appearing complex epididymal cyst on the left measuring 6.5 mm. This previously measured 6 mm.  Hydrocele:  Bilateral hydroceles right greater than left.  Varicocele:  None visualized.  Pulsed Doppler interrogation of both testes demonstrates low resistance arterial and venous waveforms bilaterally.  IMPRESSION: 1. The testes exhibit normal echotexture and vascularity. There is no evidence of torsion. 2. There is a stable epididymal cyst on the left. The right epididymis is normal. 3. There is stable large  hydroceles greater on the right than on the left. Septations are noted in the right-sided hydrocele.   Electronically Signed   By: David  [place]   On: 03/04/2014 11:17    Scheduled Meds: . aspirin EC  81 mg Oral Daily  . cefTRIAXone (ROCEPHIN)  IV  1 g Intravenous Q24H  . doxazosin  4 mg Oral QHS  . enoxaparin (LOVENOX) injection  30 mg Subcutaneous Q24H  . hydrALAZINE  100 mg Oral TID  . Influenza vac split quadrivalent PF  0.5 mL Intramuscular Tomorrow-1000  . insulin aspart  0-15 Units Subcutaneous TID WC  . insulin aspart  0-5 Units Subcutaneous QHS  . metoprolol  200 mg Oral BID  . pneumococcal 23 valent vaccine  0.5 mL Intramuscular Tomorrow-1000  . potassium chloride  20 mEq Oral Daily   Continuous Infusions: . sodium chloride 135 mL/hr at 03/06/14 1610    Active Problems:   Acute on chronic renal insufficiency   Dehydration   UTI (lower urinary tract infection)  Time spent:  Uzziel Russey K  [facility] Pager [phone]. If 7PM-7AM, please contact night-coverage at [email or web address], password [facility] 03/06/2014, 10:50 AM  LOS: 4 days

## 2014-03-06 NOTE — Consult Note (Addendum)
WOC wound consult note Reason for Consult: Consult requested for bilat lower knee wounds.  Pt fell at home and crawled across the floor for an unknown period of time.  Consult was completed via phone with assistance from the bedside nurse for wound descriptions and measurements.   Wound type: Full thickness abrasions related to prolonged friction/shear Pressure Ulcer POA: These were present on admission, they are NOT pressure ulcers. Measurement: Left lower knee: 5X3.7X.1cm Right lower knee: 4X7.4X.1cm Drainage (amount, consistency, odor) Right leg 100% red and moist, small amt yellow drainage, no odor Left leg 65% tightly adhered yellow slough,35%red, no odor, small amt yellow drainage Periwound:Intact skin surrounding. Dressing procedure/placement/frequency: Santyl ointment for chemical debridement of nonviable tissue to left knee.  Xeroform for moist healing of right knee. Please re-consult if further assistance is needed.  Thank-you,  Cammie Mcgeeawn Felix Meras MSN, RN, CWOCN, [place]CN-AP, CNS [phone]

## 2014-03-07 LAB — BASIC METABOLIC PANEL
ANION GAP: 13 (ref 5–15)
BUN: 61 mg/dL — AB (ref 6–23)
CALCIUM: 8 mg/dL — AB (ref 8.4–10.5)
CO2: 26 mEq/L (ref 19–32)
CREATININE: 3.82 mg/dL — AB (ref 0.50–1.35)
Chloride: 104 mEq/L (ref 96–112)
GFR calc Af Amer: 17 mL/min — ABNORMAL LOW (ref >90)
GFR calc non Af Amer: 14 mL/min — ABNORMAL LOW (ref >90)
Glucose, Bld: 138 mg/dL — ABNORMAL HIGH (ref 70–99)
Potassium: 4.1 mEq/L (ref 3.7–5.3)
Sodium: 143 mEq/L (ref 137–147)

## 2014-03-07 LAB — MYOGLOBIN, SERUM: Myoglobin: 14000 mcg/L — ABNORMAL HIGH (ref <=50)

## 2014-03-07 LAB — GLUCOSE, CAPILLARY
GLUCOSE-CAPILLARY: 136 mg/dL — AB (ref 70–99)
GLUCOSE-CAPILLARY: 187 mg/dL — AB (ref 70–99)
GLUCOSE-CAPILLARY: 139 mg/dL — AB (ref 70–99)
Glucose-Capillary: 143 mg/dL — ABNORMAL HIGH (ref 70–99)

## 2014-03-07 LAB — CK TOTAL AND CKMB (NOT AT ARMC)
CK, MB: 3.9 ng/mL (ref 0.3–4.0)
RELATIVE INDEX: 0.1 (ref 0.0–2.5)
Total CK: 3807 U/L — ABNORMAL HIGH (ref 7–232)

## 2014-03-07 LAB — CK: CK TOTAL: 3866 U/L — AB (ref 7–232)

## 2014-03-07 NOTE — Progress Notes (Signed)
Steven [name]  MRN: [id number]  DOB/AGE: 10-02-1940 73 y.o.  Primary Care Physician:KNOWLTON,STEPHEN D, MD  Admit date: 03/02/2014  Chief Complaint:  Chief Complaint  Patient presents with  . Fall    S-Pt presented on  03/02/2014 with  Chief Complaint  Patient presents with  . Fall  .    Pt today feels better  Meds . aspirin EC  81 mg Oral Daily  . cefTRIAXone (ROCEPHIN)  IV  1 g Intravenous Q24H  . collagenase   Topical Daily  . doxazosin  4 mg Oral QHS  . enoxaparin (LOVENOX) injection  30 mg Subcutaneous Q24H  . hydrALAZINE  100 mg Oral TID  . Influenza vac split quadrivalent PF  0.5 mL Intramuscular Tomorrow-1000  . insulin aspart  0-15 Units Subcutaneous TID WC  . insulin aspart  0-5 Units Subcutaneous QHS  . metoprolol  200 mg Oral BID  . pneumococcal 23 valent vaccine  0.5 mL Intramuscular Tomorrow-1000  . potassium chloride  20 mEq Oral Daily     Physical Exam: Vital signs in last 24 hours: Temp:  [97.5 F (36.4 C)] 97.5 F (36.4 C) (10/07 0537) Pulse Rate:  [80-96] 80 (10/07 0537) Resp:  [20-22] 20 (10/07 0537) BP: (108-124)/(52-55) 108/55 mmHg (10/07 0537) SpO2:  [94 %-100 %] 100 % (10/07 0537) Weight change:  Last BM Date: 03/06/14  Intake/Output from previous day: 10/06 0701 - 10/07 0700 In: 3996.8 [P.O.:600; I.V.:3396.8] Out: 5450 [Urine:5450] Total I/O In: 360 [P.O.:360] Out: -    Physical Exam: General- pt is awake,alert, oriented to time place and person Resp- No acute REsp distress, CTA B/L NO Rhonchi CVS- S1S2 regular in rate and rhythm GIT- BS+, soft, NT, ND EXT- NO LE Edema, Cyanosis   Lab Results: Results for Steven PinksMCKIEVER, Steven [name] (MRN [id number]) as of 03/07/2014 09:25  Ref. Range 03/03/2014 05:32  Hemoglobin Latest Range: 13.0-17.0 g/dL 56.210.0 (L)   BMET  Recent Labs  03/06/14 0543 03/07/14 0523  NA 139 143  K 3.7 4.1  CL 99 104  CO2 26 26  GLUCOSE 126* 138*  BUN 64* 61*  CREATININE 4.39* 3.82*  CALCIUM 8.0* 8.0*    Trend Creatinine 2015   2.5=>4.64=>4.39=>3.82 2014   1.7--1.97 2013    1.64 2012    1.6--1.8 2010     1.6--2.11 2008     1.8--2.29  CK 13086=>[id number]=>5861  Lab Results  Component Value Date   CALCIUM 8.0* 03/07/2014   CAION 1.14 07/08/2010   PHOS 6.2* 03/05/2014          Impression: 1)Renal  AKI secondary to Rhabdomyolysis                AKI on CKD                AKI now improving               CKD stage 3/4.               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 Sec to Rhabdo  5)DM Primary MD following  6)Electrolytes Normokalemic NOrmonatremic   7)Acid base Co2 at goal     Plan:  Will continue current care       Cloee Dunwoody S 03/07/2014, 9:25 AM

## 2014-03-07 NOTE — Progress Notes (Signed)
[facility] PROGRESS NOTE  Lars PinksJoseph Davoli ZOX:[id number] DOB: 12/10/1940 DOA: 03/02/2014 PCP: Milana ObeyKNOWLTON,STEPHEN D, MD  Assessment/Plan: ARF -2/2 rhabdo. -Improving with IVF. -Continue to follow renal function.  Rhabdomyolysis -2/2 fall with prolonged immobility. -CPK trending down. -Continue to monitor. -Continue IVF.  Fall -Will obtain PT/OT eval.  HTN -Well controlled.  DM-II -Fair control.  Code Status: DNR Family Communication: Patient only  Disposition Plan: To be determined.   Consultants:  Renal   Antibiotics:  None   Subjective: No complaints. Starting to complain of peripheral edema.  Objective: Filed Vitals:   03/06/14 1507 03/06/14 2213 03/07/14 0537 03/07/14 1535  BP: 119/55 124/52 108/55 147/63  Pulse: 96 86 80 67  Temp:  97.5 F (36.4 C) 97.5 F (36.4 C) 99.1 F (37.3 C)  TempSrc:  Oral Oral Oral  Resp: 22 20 20 20   Height:      Weight:      SpO2: 96% 94% 100% 93%    Intake/Output Summary (Last 24 hours) at 03/07/14 1756 Last data filed at 03/07/14 1727  Gross per 24 hour  Intake   3144 ml  Output   5600 ml  Net  -2456 ml   Filed Weights   03/02/14 1213  Weight: 113.399 kg (250 lb)    Exam:   General:  AA ox3  Cardiovascular: RRR  Respiratory: CTA B  Abdomen: S/NT/+BS  Extremities: 2+ edema bilaterally   Neurologic:  Non-focal  Data Reviewed: Basic Metabolic Panel:  Recent Labs Lab 03/03/14 0532 03/04/14 0606 03/05/14 0530 03/06/14 0543 03/07/14 0523  NA 144 141 141 139 143  K 3.9 3.7 3.5* 3.7 4.1  CL 109 107 105 99 104  CO2 20 18* 21 26 26   GLUCOSE 201* 151* 153* 126* 138*  BUN 46* 61* 63* 64* 61*  CREATININE 3.10* 4.50* 4.64* 4.39* 3.82*  CALCIUM 7.6* 7.7* 7.8* 8.0* 8.0*  PHOS  --   --  6.2*  --   --    Liver Function Tests:  Recent Labs Lab 03/02/14 1402  AST 133*  ALT 39  ALKPHOS 108  BILITOT 0.5  PROT 8.2  ALBUMIN 3.1*   No results found for this basename: LIPASE,  AMYLASE,  in the last 168 hours No results found for this basename: AMMONIA,  in the last 168 hours CBC:  Recent Labs Lab 03/02/14 1402 03/03/14 0532  WBC 10.6* 9.4  NEUTROABS 9.1*  --   HGB 11.3* 10.0*  HCT 34.6* 30.3*  MCV 66.2* 65.9*  PLT 248 222   Cardiac Enzymes:  Recent Labs Lab 03/05/14 0530 03/05/14 1528 03/06/14 0543 03/07/14 0523 03/07/14 0813  CKTOTAL [id number]* 9834* 5861* 3866* 3807*  CKMB  --  11.5*  --   --  3.9   BNP (last 3 results) No results found for this basename: PROBNP,  in the last 8760 hours CBG:  Recent Labs Lab 03/06/14 1636 03/06/14 2212 03/07/14 0733 03/07/14 1129 03/07/14 1623  GLUCAP 153* 230* 136* 187* 143*    No results found for this or any previous visit (from the past 240 hour(s)).   Studies: No results found.  Scheduled Meds: . aspirin EC  81 mg Oral Daily  . cefTRIAXone (ROCEPHIN)  IV  1 g Intravenous Q24H  . collagenase   Topical Daily  . doxazosin  4 mg Oral QHS  . enoxaparin (LOVENOX) injection  30 mg Subcutaneous Q24H  . hydrALAZINE  100 mg Oral TID  . Influenza vac split quadrivalent PF  0.5  mL Intramuscular Tomorrow-1000  . insulin aspart  0-15 Units Subcutaneous TID WC  . insulin aspart  0-5 Units Subcutaneous QHS  . metoprolol  200 mg Oral BID  . pneumococcal 23 valent vaccine  0.5 mL Intramuscular Tomorrow-1000  . potassium chloride  20 mEq Oral Daily   Continuous Infusions: . sodium chloride 135 mL/hr at 03/06/14 1507    Active Problems:   Acute on chronic renal insufficiency   Dehydration   UTI (lower urinary tract infection)    Time spent: 40 minutes. Greater than 50% of this time was spent in direct contact with the patient coordinating care.    Chaya Jan  [facility] Pager [phone]  If 7PM-7AM, please contact night-coverage at [email or web address], password [facility] 03/07/2014, 5:56 PM  LOS: 5 days

## 2014-03-08 DIAGNOSIS — M6282 Rhabdomyolysis: Secondary | ICD-10-CM

## 2014-03-08 LAB — BASIC METABOLIC PANEL
Anion gap: 14 (ref 5–15)
BUN: 55 mg/dL — ABNORMAL HIGH (ref 6–23)
CALCIUM: 8.4 mg/dL (ref 8.4–10.5)
CO2: 23 mEq/L (ref 19–32)
CREATININE: 3.16 mg/dL — AB (ref 0.50–1.35)
Chloride: 104 mEq/L (ref 96–112)
GFR, EST AFRICAN AMERICAN: 21 mL/min — AB (ref >90)
GFR, EST NON AFRICAN AMERICAN: 18 mL/min — AB (ref >90)
GLUCOSE: 139 mg/dL — AB (ref 70–99)
POTASSIUM: 4.1 meq/L (ref 3.7–5.3)
Sodium: 141 mEq/L (ref 137–147)

## 2014-03-08 LAB — GLUCOSE, CAPILLARY
GLUCOSE-CAPILLARY: 176 mg/dL — AB (ref 70–99)
GLUCOSE-CAPILLARY: 180 mg/dL — AB (ref 70–99)
Glucose-Capillary: 126 mg/dL — ABNORMAL HIGH (ref 70–99)
Glucose-Capillary: 129 mg/dL — ABNORMAL HIGH (ref 70–99)

## 2014-03-08 LAB — CK: Total CK: 1843 U/L — ABNORMAL HIGH (ref 7–232)

## 2014-03-08 NOTE — Evaluation (Signed)
Physical Therapy Evaluation Patient Details Name: Steven [name] MRN: [id number] DOB: Jan 02, 1941 Today's Date: 03/08/2014   History of Present Illness  HPI: Steven [name] is a 73 y.o. male who was admitted after a fall at home whereupon he laid for several hours before he was found.  He lives alone and has multiple medical problems.  He was diagnosed with acute on chronic renal failure, dehydration, UTI and rhabdomyolysis.  Chronic medical problems include DM, Left BKA, lymphedema, multiple falls.  He is primarily w/c dependent but can walk short distances with a prosthesis.  Clinical Impression   Pt was seen for evaluation and found to have profound weakness throughout.  He has generalized edema and has open wounds over the tibial tuberosities bilaterally.  He has a left LE prosthesis which he can not use presently due to the wound on his leg.  Pt is requiring total assist at this point.  He is unable to even roll in the bed due to weakness and is unable to feed himself due to edema in his hands.  He will need SNF at d/c.  Pt is not willing to go to the [facility] or [facility] and is not really wanting to go anywhere but home.  I have tried to explain the reality of his situation but I am not sure if he is hearing me.     Follow Up Recommendations SNF    Equipment Recommendations  None recommended by PT    Recommendations for Other Services   OT    Precautions / Restrictions Precautions Precautions: Fall Restrictions Weight Bearing Restrictions: No      Mobility  Bed Mobility Overal bed mobility: +2 for physical assistance                Transfers Overall transfer level: Needs assistance   Transfers: Sit to/from Stand           General transfer comment: total assist with a lift is needed for transfers  Ambulation/Gait                Stairs            Wheelchair Mobility    Modified Rankin (Stroke Patients Only)       Balance Overall balance  assessment: History of Falls                                           Pertinent Vitals/Pain Pain Assessment: No/denies pain    Home Living Family/patient expects to be discharged to:: Skilled nursing facility                      Prior Function Level of Independence: Independent with assistive device(s)         Comments: functions from a w/c     Hand Dominance   Dominant Hand: Left    Extremity/Trunk Assessment   Upper Extremity Assessment: Defer to OT evaluation           Lower Extremity Assessment: Generalized weakness;RLE deficits/detail;LLE deficits/detail RLE Deficits / Details: significant edema throughout the leg with profound weakness (generally 1/5)...he has an open wound over tibeal tuberosity LLE Deficits / Details: strength is 3/5 with ROM to WNL...he has an open wound over the tibeal tuberosity     Communication   Communication: No difficulties  Cognition Arousal/Alertness: Awake/alert Behavior During Therapy: WFL for tasks assessed/performed Overall  Cognitive Status: Within Functional Limits for tasks assessed                      General Comments      Exercises        Assessment/Plan    PT Assessment All further PT needs can be met in the next venue of care  PT Diagnosis Generalized weakness   PT Problem List Decreased strength;Decreased activity tolerance;Decreased mobility;Obesity  PT Treatment Interventions     PT Goals (Current goals can be found in the Care Plan section) Acute Rehab PT Goals PT Goal Formulation: No goals set, d/c therapy    Frequency     Barriers to discharge  pt lives alone      Co-evaluation               End of Session     Patient left: in bed;with call bell/phone within reach;with bed alarm set Nurse Communication: Need for lift equipment;Mobility status         Time: 1142-1220 PT Time Calculation (min): 38 min   Charges:   PT Evaluation $Initial PT  Evaluation Tier I: 1 Procedure     PT G CodesSable Feil 03/08/2014, 12:43 PM

## 2014-03-08 NOTE — Clinical Social Work Psychosocial (Signed)
Clinical Social Work Department BRIEF PSYCHOSOCIAL ASSESSMENT 03/08/2014  Patient:  Steven [name], Steven [name]     Account Number:  [id number]     Admit date:  03/02/2014  Clinical Social Worker:  Wyatt Haste  Date/Time:  03/08/2014 02:52 PM  Referred by:  CSW  Date Referred:  03/08/2014 Referred for  SNF Placement   Other Referral:   Interview type:  Patient Other interview type:    PSYCHOSOCIAL DATA Living Status:  ALONE Admitted from facility:   Level of care:   Primary support name:  Steven [name] Primary support relationship to patient:  CHILD, ADULT Degree of support available:   adequate    CURRENT CONCERNS Current Concerns  Post-Acute Placement   Other Concerns:    SOCIAL WORK ASSESSMENT / PLAN CSW met with pt at bedside. Pt alert and oriented and reports he lives alone and generally manages well. Pt came to ED last week with weakness, fever and chills and was admitted with UTI. He continues to have significant weakness. Pt reports at baseline he is independent. He had a left BKA in 2005 and ambulates with prosthesis. He indicates that he has lived alone for about 15 years. His best support is his daughter, Steven [name] who lives in [place]. PT evaluated pt and recommendation is for SNF. Pt has experience at 3 different SNFs. He described them as "old folks homes" and is not happy about considering going again. However, pt realizes that he cannot function at home currently. He does not want to return to [facility] facilities and is requesting [facility]. Pt is hopeful for short term therapy prior to return home.   Assessment/plan status:  Psychosocial Support/Ongoing Assessment of Needs Other assessment/ plan:   Information/referral to community resources:   SNF list    PATIENT'S/FAMILY'S RESPONSE TO PLAN OF CARE: Pt is reluctantly agreeable to SNF. CSW will initiate bed search and follow up.       Benay Pike, [place]

## 2014-03-08 NOTE — Progress Notes (Signed)
Subjective: Interval History: Patient offers no complain. Denies any nausea or vomiting. Denies any difficulty in breathing  Objective: Vital signs in last 24 hours: Temp:  [98.4 F (36.9 C)-99.1 F (37.3 C)] 99.1 F (37.3 C) (10/08 0725) Pulse Rate:  [65-71] 65 (10/08 0725) Resp:  [20] 20 (10/08 0725) BP: (133-147)/(60-66) 133/62 mmHg (10/08 0725) SpO2:  [93 %-98 %] 95 % (10/08 0725) Weight change:   Intake/Output from previous day: 10/07 0701 - 10/08 0700 In: 1860 [P.O.:960; I.V.:900] Out: 5050 [Urine:5050] Intake/Output this shift: Total I/O In: 300 [P.O.:300] Out: -   General appearance: alert, cooperative and no distress Resp: clear to auscultation bilaterally Cardio: regular rate and rhythm, S1, S2 normal, no murmur, click, rub or gallop GI: soft, non-tender; bowel sounds normal; no masses,  no organomegaly Extremities: extremities normal, atraumatic, no cyanosis or edema  Lab Results: No results found for this basename: WBC, HGB, HCT, PLT,  in the last 72 hours BMET:   Recent Labs  03/07/14 0523 03/08/14 0636  NA 143 141  K 4.1 4.1  CL 104 104  CO2 26 23  GLUCOSE 138* 139*  BUN 61* 55*  CREATININE 3.82* 3.16*  CALCIUM 8.0* 8.4   No results found for this basename: PTH,  in the last 72 hours Iron Studies: No results found for this basename: IRON, TIBC, TRANSFERRIN, FERRITIN,  in the last 72 hours  Studies/Results: No results found.  I have reviewed the patient's current medications.  Assessment/Plan: Problem #1 acute kidney injury: His BUN and creatinine is improving. His creatinine is still above his base line Problem #2 rhabdomyolysis: His CPK is has come to 1840 and improving Problem #3 diabetes  Problem #4 metabolic acidosis: has improved not on sodium bicarboante Problem #5 anemia Problem #6 hypertension: His blood pressure seems to be reasonably controlled. Problem #7 History of CVA Problem #8 peripheral vascular disease Plan: We'll continue  with present treatment D/C Kcl We will check iron studies and cbc Check his basic metabolic panel     LOS: 6 days   Steven [name] S 03/08/2014,10:10 AM

## 2014-03-08 NOTE — Evaluation (Signed)
Occupational Therapy Evaluation Patient Details Name: Trendon Zaring MRN: [id number] DOB: May 10, 1941 Today's Date: 03/08/2014    History of Present Illness HPI: Baron Parmelee is a 73 y.o. male who was admitted after a fall at home whereupon he laid for several hours before he was found.  He lives alone and has multiple medical problems.  He was diagnosed with acute on chronic renal failure, dehydration, UTI and rhabdomyolysis.  Chronic medica problems include DM, Left BKA, lymphedema, multiple falls.  He is primarily w/c dependent but can walk short distances with a prosthesis.   Clinical Impression   Patient in bed upon therapy arrival. Patient initially was against going to skilled facility for continues therapy. Therapist was able to explain to patient the importance of going to a skilled facility for rehab as he is not safe to return home alone. Patient did agree to go as long as it was not at [facility] or [facility]. Therapist informed case Production designer, theatre/television/film. Pt is also interested in a life alert at home. Educated patient on proper arm positioning and AROM exercises to decrease edema. Pt verbalized understanding. Recommend SNF at discharge.    Follow Up Recommendations  SNF    Equipment Recommendations  None recommended by OT       Precautions / Restrictions Precautions Precautions: Fall Precaution Comments: L BKA Restrictions Weight Bearing Restrictions: No      Mobility Bed Mobility Overal bed mobility: +2 for physical assistance;+ 2 for safety/equipment             General bed mobility comments: Supine to sit with head of the bed elevate. 2 person assist to help guide patient and for physical assist.  Transfers Overall transfer level: Needs assistance   Transfers: Sit to/from Stand           General transfer comment: Pt will be total assist for transfer. Lift for transfers will be needed unless patient can bear weight on RLE.    Balance Overall balance assessment:  History of Falls;Needs assistance Sitting-balance support: No upper extremity supported;Feet supported Sitting balance-Leahy Scale: Good Sitting balance - Comments: Patient sat on EOB for 20 minutes without physical assist. When balance was tested while sitting patient was able to self correct and remain upright.                                     ADL Overall ADL's : Needs assistance/impaired                                       General ADL Comments: Patient did not complete a transfer this date. Pt did agree to sit on EOB with encouragement.               Pertinent Vitals/Pain Pain Assessment: No/denies pain     Hand Dominance Left   Extremity/Trunk Assessment Upper Extremity Assessment Upper Extremity Assessment: Generalized weakness (LUE: 3-/5. RUE: 3/5 Weak gross grasp. Edema LUE)   Lower Extremity Assessment Lower Extremity Assessment: Defer to PT evaluation RLE Deficits / Details: significant edema throughout the leg with profound weakness (generally 1/5)...he has an open wound over tibeal tuberosity LLE Deficits / Details: strength is 3/5 with ROM to WNL...he has an open wound over the tibeal tuberosity       Communication Communication Communication: No difficulties   Cognition Arousal/Alertness:  Awake/alert Behavior During Therapy: WFL for tasks assessed/performed Overall Cognitive Status: Within Functional Limits for tasks assessed                     General Comments       Exercises       Shoulder Instructions      Home Living Family/patient expects to be discharged to:: Skilled nursing facility Living Arrangements: Alone                                      Prior Functioning/Environment Level of Independence: Independent with assistive device(s)        Comments: functions from a w/c. patient has a powered w/c and manual w/c    OT Diagnosis: Generalized weakness   OT Problem List:  Decreased strength;Decreased range of motion;Increased edema;Impaired UE functional use;Impaired balance (sitting and/or standing)   OT Treatment/Interventions: Therapeutic exercise;Self-care/ADL training;Manual therapy;DME and/or AE instruction;Therapeutic activities;Balance training;Patient/family education    OT Goals(Current goals can be found in the care plan section) Acute Rehab OT Goals Patient Stated Goal: To go home. OT Goal Formulation: With patient Time For Goal Achievement: 03/22/14 Potential to Achieve Goals: Good  OT Frequency: Min 2X/week   Barriers to D/C: Decreased caregiver support  Recommend SNF at discharge          End of Session    Activity Tolerance: Patient tolerated treatment well Patient left: in bed;Other (comment) (with case manager present)   Time: [id number] OT Time Calculation (min): 41 min Charges:  OT General Charges $OT Visit: 1 Procedure OT Evaluation $Initial OT Evaluation Tier I: 1 Procedure  Limmie PatriciaLaura Rowen Hur, OTR/L,CBIS   03/08/2014, 2:53 PM

## 2014-03-08 NOTE — Clinical Social Work Placement (Signed)
Clinical Social Work Department CLINICAL SOCIAL WORK PLACEMENT NOTE 03/08/2014  Patient:  Steven [name],Tamari  Account Number:  [id number] Admit date:  03/02/2014  Clinical Social Worker:  Derenda FennelKARA Janace Decker, LCSW  Date/time:  03/08/2014 02:42 PM  Clinical Social Work is seeking post-discharge placement for this patient at the following level of care:   SKILLED NURSING   (*CSW will update this form in Epic as items are completed)   03/08/2014  Patient/family provided with [facility] of Clinical Social Work's list of facilities offering this level of care within the geographic area requested by the patient (or if unable, by the patient's family).  03/08/2014  Patient/family informed of their freedom to choose among providers that offer the needed level of care, that participate in Medicare, Medicaid or managed care program needed by the patient, have an available bed and are willing to accept the patient.  03/08/2014  Patient/family informed of [facility]' ownership interest in [facility], as well as of the fact that they are under no obligation to receive care at this facility.  PASARR submitted to EDS on  PASARR number received on   FL2 transmitted to all facilities in geographic area requested by pt/family on  03/08/2014 FL2 transmitted to all facilities within larger geographic area on   Patient informed that his/her managed care company has contracts with or will negotiate with  certain facilities, including the following:     Patient/family informed of bed offers received:   Patient chooses bed at  Physician recommends and patient chooses bed at    Patient to be transferred to  on   Patient to be transferred to facility by  Patient and family notified of transfer on  Name of family member notified:    The following physician request were entered in Epic:   Additional Comments: Pt has existing pasarr.  Derenda FennelKara Ferrel [place], [place]CS[place] [phone]

## 2014-03-08 NOTE — Progress Notes (Signed)
[facility] PROGRESS NOTE  Steven [name] WUJ:[id number] DOB: 03/11/1941 DOA: 03/02/2014 PCP: Milana Obey, MD  Assessment/Plan: ARF -2/2 rhabdo. -Improving with IVF. -Continue to follow renal function.  Rhabdomyolysis -2/2 fall with prolonged immobility. -CPK trending down. -Continue to monitor. -Continue IVF.  Fall -PT/OT consultations are recommending SNF following discharge.  HTN -Well controlled.  DM-II -Fair control.  Code Status: DNR Family Communication: Patient only  Disposition Plan: To be determined.   Consultants:  Renal   Antibiotics:  None   Subjective: No complaints. Starting to complain of peripheral edema.  Objective: Filed Vitals:   03/07/14 2203 03/08/14 0604 03/08/14 0725 03/08/14 1410  BP: 134/60 134/66 133/62 126/64  Pulse: 71 66 65 66  Temp: 98.5 F (36.9 C) 98.4 F (36.9 C) 99.1 F (37.3 C) 98.1 F (36.7 C)  TempSrc: Oral Oral Oral Oral  Resp: 20 20 20 20   Height:      Weight:      SpO2: 94% 98% 95% 94%    Intake/Output Summary (Last 24 hours) at 03/08/14 1828 Last data filed at 03/08/14 1300  Gross per 24 hour  Intake   1560 ml  Output   3800 ml  Net  -2240 ml   Filed Weights   03/02/14 1213  Weight: 113.399 kg (250 lb)    Exam:   General:  AA ox3  Cardiovascular: RRR  Respiratory: CTA B  Abdomen: S/NT/+BS  Extremities: Status post left BKA, 1+ pitting edema on the right.  Neurologic:  Non-focal  Data Reviewed: Basic Metabolic Panel:  Recent Labs Lab 03/04/14 0606 03/05/14 0530 03/06/14 0543 03/07/14 0523 03/08/14 0636  NA 141 141 139 143 141  K 3.7 3.5* 3.7 4.1 4.1  CL 107 105 99 104 104  CO2 18* 21 26 26 23   GLUCOSE 151* 153* 126* 138* 139*  BUN 61* 63* 64* 61* 55*  CREATININE 4.50* 4.64* 4.39* 3.82* 3.16*  CALCIUM 7.7* 7.8* 8.0* 8.0* 8.4  PHOS  --  6.2*  --   --   --    Liver Function Tests:  Recent Labs Lab 03/02/14 1402  AST 133*  ALT 39  ALKPHOS 108    BILITOT 0.5  PROT 8.2  ALBUMIN 3.1*   No results found for this basename: LIPASE, AMYLASE,  in the last 168 hours No results found for this basename: AMMONIA,  in the last 168 hours CBC:  Recent Labs Lab 03/02/14 1402 03/03/14 0532  WBC 10.6* 9.4  NEUTROABS 9.1*  --   HGB 11.3* 10.0*  HCT 34.6* 30.3*  MCV 66.2* 65.9*  PLT 248 222   Cardiac Enzymes:  Recent Labs Lab 03/05/14 0530 03/05/14 1528 03/06/14 0543 03/07/14 0523 03/07/14 0813 03/08/14 0636  CKTOTAL 95621* 9834* 5861* 3866* 3807* 1843*  CKMB  --  11.5*  --   --  3.9  --    BNP (last 3 results) No results found for this basename: PROBNP,  in the last 8760 hours CBG:  Recent Labs Lab 03/07/14 1623 03/07/14 2042 03/08/14 0751 03/08/14 1124 03/08/14 1632  GLUCAP 143* 139* 126* 176* 129*    No results found for this or any previous visit (from the past 240 hour(s)).   Studies: No results found.  Scheduled Meds: . aspirin EC  81 mg Oral Daily  . cefTRIAXone (ROCEPHIN)  IV  1 g Intravenous Q24H  . collagenase   Topical Daily  . doxazosin  4 mg Oral QHS  . enoxaparin (LOVENOX) injection  30 mg  Subcutaneous Q24H  . hydrALAZINE  100 mg Oral TID  . Influenza vac split quadrivalent PF  0.5 mL Intramuscular Tomorrow-1000  . insulin aspart  0-15 Units Subcutaneous TID WC  . insulin aspart  0-5 Units Subcutaneous QHS  . metoprolol  200 mg Oral BID  . pneumococcal 23 valent vaccine  0.5 mL Intramuscular Tomorrow-1000  . potassium chloride  20 mEq Oral Daily   Continuous Infusions: . sodium chloride 75 mL/hr at 03/07/14 1756    Active Problems:   Acute on chronic renal insufficiency   Dehydration   UTI (lower urinary tract infection)    Time spent: 40 minutes. Greater than 50% of this time was spent in direct contact with the patient coordinating care.    [name],[name]  [facility] Pager [phone]  If 7PM-7AM, please contact night-coverage at [email or web address], password  [facility] UnitRH1 03/08/2014, 6:28 PM  LOS: 6 days

## 2014-03-09 LAB — CBC
HEMATOCRIT: 26.7 % — AB (ref 39.0–52.0)
HEMOGLOBIN: 8.7 g/dL — AB (ref 13.0–17.0)
MCH: 21.5 pg — AB (ref 26.0–34.0)
MCHC: 32.6 g/dL (ref 30.0–36.0)
MCV: 65.9 fL — ABNORMAL LOW (ref 78.0–100.0)
Platelets: 309 10*3/uL (ref 150–400)
RBC: 4.05 MIL/uL — ABNORMAL LOW (ref 4.22–5.81)
RDW: 16.6 % — ABNORMAL HIGH (ref 11.5–15.5)
WBC: 7.6 10*3/uL (ref 4.0–10.5)

## 2014-03-09 LAB — BASIC METABOLIC PANEL
ANION GAP: 12 (ref 5–15)
BUN: 50 mg/dL — AB (ref 6–23)
CHLORIDE: 104 meq/L (ref 96–112)
CO2: 23 meq/L (ref 19–32)
CREATININE: 2.79 mg/dL — AB (ref 0.50–1.35)
Calcium: 8.5 mg/dL (ref 8.4–10.5)
GFR calc Af Amer: 24 mL/min — ABNORMAL LOW (ref >90)
GFR calc non Af Amer: 21 mL/min — ABNORMAL LOW (ref >90)
Glucose, Bld: 128 mg/dL — ABNORMAL HIGH (ref 70–99)
POTASSIUM: 4.2 meq/L (ref 3.7–5.3)
Sodium: 139 mEq/L (ref 137–147)

## 2014-03-09 LAB — IRON AND TIBC
IRON: 17 ug/dL — AB (ref 42–135)
Saturation Ratios: 10 % — ABNORMAL LOW (ref 20–55)
TIBC: 171 ug/dL — ABNORMAL LOW (ref 215–435)
UIBC: 154 ug/dL (ref 125–400)

## 2014-03-09 LAB — GLUCOSE, CAPILLARY
GLUCOSE-CAPILLARY: 158 mg/dL — AB (ref 70–99)
Glucose-Capillary: 120 mg/dL — ABNORMAL HIGH (ref 70–99)

## 2014-03-09 LAB — CK: CK TOTAL: 1068 U/L — AB (ref 7–232)

## 2014-03-09 LAB — MYOGLOBIN, URINE: Myoglobin, Ur: 5930 mcg/L — ABNORMAL HIGH (ref <28)

## 2014-03-09 LAB — FERRITIN: FERRITIN: 289 ng/mL (ref 22–322)

## 2014-03-09 MED ORDER — COLLAGENASE 250 UNIT/GM EX OINT: TOPICAL_OINTMENT | Freq: Every day | CUTANEOUS | Status: DC

## 2014-03-09 NOTE — Progress Notes (Signed)
Patient taken out via wheelchair. Patient and packet sent with [place] to [facility] [place]. [facility] [place] [facility]

## 2014-03-09 NOTE — Progress Notes (Signed)
Dressings to bilateral thighs changed as ordered. Sites seen by Dr. Ardyth HarpsHernandez. Improvements seen from dressing change done 10/6

## 2014-03-09 NOTE — Progress Notes (Signed)
Subjective: Interval History: No complaint  Objective: Vital signs in last 24 hours: Temp:  [98.1 F (36.7 C)-99.1 F (37.3 C)] 98.9 F (37.2 C) (10/09 0623) Pulse Rate:  [65-72] 68 (10/09 0926) Resp:  [16-20] 20 (10/09 0623) BP: (122-142)/(60-64) 122/60 mmHg (10/09 0926) SpO2:  [94 %-99 %] 96 % (10/09 0623) Weight change:   Intake/Output from previous day: 10/08 0701 - 10/09 0700 In: 540 [P.O.:540] Out: 1950 [Urine:1950] Intake/Output this shift:    General appearance: alert, cooperative and no distress Resp: clear to auscultation bilaterally Cardio: regular rate and rhythm, S1, S2 normal, no murmur, click, rub or gallop GI: soft, non-tender; bowel sounds normal; no masses,  no organomegaly Extremities: extremities normal, atraumatic, no cyanosis or edema  Lab Results:  Recent Labs  03/09/14 0639  WBC 7.6  HGB 8.7*  HCT 26.7*  PLT 309   BMET:   Recent Labs  03/08/14 0636 03/09/14 0639  NA 141 139  K 4.1 4.2  CL 104 104  CO2 23 23  GLUCOSE 139* 128*  BUN 55* 50*  CREATININE 3.16* 2.79*  CALCIUM 8.4 8.5   No results found for this basename: PTH,  in the last 72 hours Iron Studies: No results found for this basename: IRON, TIBC, TRANSFERRIN, FERRITIN,  in the last 72 hours  Studies/Results: No results found.  I have reviewed the patient's current medications.  Assessment/Plan: Problem #1 acute kidney injury: His BUN and creatinine is progressiveley improving and patient is asymptomatic. He has 5 liters of out put Problem #2 rhabdomyolysis: His CPK is has come to 1000 and improving Problem #3 diabetes  Problem #4 metabolic acidosis: has corrected Problem #5 anemia; Iron studies are pending.His hemoglobin remains low Problem #6 hypertension: His blood pressure seems to be reasonably controlled. Problem #7 History of CVA Problem #8 peripheral vascular disease Plan: We'll continue with present treatment D/C Kcl Check his basic metabolic panel     LOS: 7 days   Steven [name] S 03/09/2014,10:57 AM

## 2014-03-09 NOTE — Discharge Summary (Signed)
Physician Discharge Summary  Steven [name] WUJ:[id number]:[name] DOB: 1940/10/06 DOA: 03/02/2014  PCP: Milana ObeyKNOWLTON,STEPHEN D, MD  Admit date: 03/02/2014 Discharge date: 03/09/2014  Time spent: 45 minutes  Recommendations for Outpatient Follow-up:  -Will be discharged to SNF today. -Recheck BMET in 3 days to ensure Cr continues to decrease and to determine when feasible to restart lasix and lisnipril.   Discharge Diagnoses:  Principal Problem:   Rhabdomyolysis Active Problems:   Acute on chronic renal insufficiency   Dehydration   UTI (lower urinary tract infection)   Discharge Condition: Stable and improved  Filed Weights   03/02/14 1213  Weight: 113.399 kg (250 lb)    History of present illness:  Steven [name] is a 73 y.o. male  With type 2 diabetes that is controlled with oral medications, chronic kidney disease stage III, hypertension, hyperlipidemia, left BKA who presents to the hospital with one week of weakness with fevers and chills that started on Tuesday. The patient has suffered multiple falls over the past week due to his weakness and has been crawling on his knees. Additionally, his weakness has become worse over the past couple of days. No palliating or provoking factor.   Hospital Course:   ARF  -2/2 rhabdo.  -Improving with IVF.  -Continue to follow renal function.  -Cr down to 2.79 from 4.64 on admission.  Rhabdomyolysis  -2/2 fall with prolonged immobility.  -CPK trending down to 1068 from 27,000. -Continue to monitor.  -Continue IVF.   Fall  -PT/OT consultations are recommending SNF following discharge.   HTN  -Well controlled.   DM-II  -Fair control.      Procedures:  None   Consultations:  Renal  Discharge Instructions  Discharge Instructions   Diet - low sodium heart healthy    Complete by:  As directed      Discontinue IV    Complete by:  As directed      Increase activity slowly    Complete by:  As directed             Medication List    STOP taking these medications       furosemide 40 MG tablet  Commonly known as:  LASIX     lisinopril 10 MG tablet  Commonly known as:  PRINIVIL,ZESTRIL     metFORMIN 500 MG tablet  Commonly known as:  GLUCOPHAGE      TAKE these medications       amLODipine 10 MG tablet  Commonly known as:  NORVASC  Take 10 mg by mouth daily.     aspirin 81 MG tablet  Take 81 mg by mouth daily.     collagenase ointment  Commonly known as:  SANTYL  Apply topically daily.     doxazosin 4 MG tablet  Commonly known as:  CARDURA  Take 4 mg by mouth at bedtime.     ferrous sulfate 325 (65 FE) MG tablet  Take 325 mg by mouth daily.     hydrALAZINE 100 MG tablet  Commonly known as:  APRESOLINE  Take 100 mg by mouth 3 (three) times daily.     metoprolol 100 MG tablet  Commonly known as:  LOPRESSOR  Take 200 mg by mouth 2 (two) times daily.       Allergies  Allergen Reactions  . Amlodipine Besylate-Valsartan     REACTION: renal deterioration (Patient does not recall this allergy. This medication is BRAND NAME EXFORGE which includes NORVASC (Amlodipine) which patient takes daily. Patient  is not aware of the reaction, the medication listed, or the allergy itself. Advised patient to verify this medication allergy and reaction with physician.      The results of significant diagnostics from this hospitalization (including imaging, microbiology, ancillary and laboratory) are listed below for reference.    Significant Diagnostic Studies: Dg Chest 2 View  03/02/2014   CLINICAL DATA:  Weakness x1 week.  Unwitnessed fall.  Hypertension.  EXAM: CHEST  2 VIEW  COMPARISON:  06/12/2012.  FINDINGS: Mediastinum and hilar structures normal. Cardiomegaly, normal pulmonary vascularity. Subsegmental atelectasis right lung base. No pleural effusion or pneumothorax. No acute bony abnormality.  IMPRESSION: 1. Mild right base subsegmental axis. 2. Stable cardiomegaly, no CHF.  No acute  cardiopulmonary disease.   Electronically Signed   By: Steven Fus  [name]   On: 03/02/2014 16:40   US Scrotum  03/04/2014   CLINICAL DATA:  Known hydroceles; assess for change  EXAM: SCROTAL ULTRASOUND  DOPPLER ULTRASOUND OF THE TESTICLES  TECHNIQUE: Complete ultrasound examination of the testicles, epididymis, and other scrotal structures was performed. Color and spectral Doppler ultrasound were also utilized to evaluate blood flow to the testicles.  COMPARISON:  Scrotal ultrasound of June 14, 2013.  FINDINGS: Right testicle  Measurements: 4.8 x 2.7 x 2.9 cm. No mass or microlithiasis visualized.  Left testicle  Measurements: 3.9 x 2.5 x 2.4 cm. No mass or microlithiasis visualized.  Right epididymis:  1.8 x 1.1 x 1.3 cm.  Left epididymis: 1.7 x 1.4 x 1.5 cm .There is a stable appearing complex epididymal cyst on the left measuring 6.5 mm. This previously measured 6 mm.  Hydrocele:  Bilateral hydroceles right greater than left.  Varicocele:  None visualized.  Pulsed Doppler interrogation of both testes demonstrates low resistance arterial and venous waveforms bilaterally.  IMPRESSION: 1. The testes exhibit normal echotexture and vascularity. There is no evidence of torsion. 2. There is a stable epididymal cyst on the left. The right epididymis is normal. 3. There is stable large hydroceles greater on the right than on the left. Septations are noted in the right-sided hydrocele.   Electronically Signed   By: Steven  [name]   On: 03/04/2014 11:17   [place] Art/ven Flow Abd Pelv Doppler  03/04/2014   CLINICAL DATA:  Known hydroceles; assess for change  EXAM: SCROTAL ULTRASOUND  DOPPLER ULTRASOUND OF THE TESTICLES  TECHNIQUE: Complete ultrasound examination of the testicles, epididymis, and other scrotal structures was performed. Color and spectral Doppler ultrasound were also utilized to evaluate blood flow to the testicles.  COMPARISON:  Scrotal ultrasound of June 14, 2013.  FINDINGS: Right testicle   Measurements: 4.8 x 2.7 x 2.9 cm. No mass or microlithiasis visualized.  Left testicle  Measurements: 3.9 x 2.5 x 2.4 cm. No mass or microlithiasis visualized.  Right epididymis:  1.8 x 1.1 x 1.3 cm.  Left epididymis: 1.7 x 1.4 x 1.5 cm .There is a stable appearing complex epididymal cyst on the left measuring 6.5 mm. This previously measured 6 mm.  Hydrocele:  Bilateral hydroceles right greater than left.  Varicocele:  None visualized.  Pulsed Doppler interrogation of both testes demonstrates low resistance arterial and venous waveforms bilaterally.  IMPRESSION: 1. The testes exhibit normal echotexture and vascularity. There is no evidence of torsion. 2. There is a stable epididymal cyst on the left. The right epididymis is normal. 3. There is stable large hydroceles greater on the right than on the left. Septations are noted in the right-sided hydrocele.   Electronically Signed  By: Steven  [place]   On: 03/04/2014 11:17    Microbiology: No results found for this or any previous visit (from the past 240 hour(s)).   Labs: Basic Metabolic Panel:  Recent Labs Lab 03/05/14 0530 03/06/14 0543 03/07/14 0523 03/08/14 0636 03/09/14 0639  NA 141 139 143 141 139  K 3.5* 3.7 4.1 4.1 4.2  CL 105 99 104 104 104  CO2 21 26 26 23 23   GLUCOSE 153* 126* 138* 139* 128*  BUN 63* 64* 61* 55* 50*  CREATININE 4.64* 4.39* 3.82* 3.16* 2.79*  CALCIUM 7.8* 8.0* 8.0* 8.4 8.5  PHOS 6.2*  --   --   --   --    Liver Function Tests:  Recent Labs Lab 03/02/14 1402  AST 133*  ALT 39  ALKPHOS 108  BILITOT 0.5  PROT 8.2  ALBUMIN 3.1*   No results found for this basename: LIPASE, AMYLASE,  in the last 168 hours No results found for this basename: AMMONIA,  in the last 168 hours CBC:  Recent Labs Lab 03/02/14 1402 03/03/14 0532 03/09/14 0639  WBC 10.6* 9.4 7.6  NEUTROABS 9.1*  --   --   HGB 11.3* 10.0* 8.7*  HCT 34.6* 30.3* 26.7*  MCV 66.2* 65.9* 65.9*  PLT 248 222 309   Cardiac Enzymes:  Recent  Labs Lab 03/05/14 0530 03/05/14 1528 03/06/14 0543 03/07/14 0523 03/07/14 0813 03/08/14 0636 03/09/14 0639  CKTOTAL [id number]* 9834* 5861* 3866* 3807* 1843* 1068*  CKMB  --  11.5*  --   --  3.9  --   --    BNP: BNP (last 3 results) No results found for this basename: PROBNP,  in the last 8760 hours CBG:  Recent Labs Lab 03/08/14 0751 03/08/14 1124 03/08/14 1632 03/08/14 2042 03/09/14 0745  GLUCAP 126* 176* 129* 180* 120*       Signed:  HERNANDEZ ACOSTA,[name]  [facility] Pager: [phone] 03/09/2014, 12:03 PM

## 2014-09-03 ENCOUNTER — Inpatient Hospital Stay (HOSPITAL_COMMUNITY): Payer: Medicare Other

## 2014-09-03 ENCOUNTER — Inpatient Hospital Stay (HOSPITAL_COMMUNITY)
Admission: EM | Admit: 2014-09-03 | Discharge: 2014-09-11 | DRG: 853 | Disposition: A | Discharge status: Skilled Nursing Facility | Payer: Medicare Other | Attending: Internal Medicine | Admitting: Internal Medicine

## 2014-09-03 ENCOUNTER — Encounter (HOSPITAL_COMMUNITY): Payer: Self-pay | Admitting: *Deleted

## 2014-09-03 ENCOUNTER — Emergency Department (HOSPITAL_COMMUNITY): Payer: Medicare Other

## 2014-09-03 DIAGNOSIS — Z87891 Personal history of nicotine dependence: Secondary | ICD-10-CM | POA: Diagnosis not present

## 2014-09-03 DIAGNOSIS — Z89512 Acquired absence of left leg below knee: Secondary | ICD-10-CM | POA: Diagnosis not present

## 2014-09-03 DIAGNOSIS — I129 Hypertensive chronic kidney disease with stage 1 through stage 4 chronic kidney disease, or unspecified chronic kidney disease: Secondary | ICD-10-CM | POA: Diagnosis present

## 2014-09-03 DIAGNOSIS — Z66 Do not resuscitate: Secondary | ICD-10-CM | POA: Diagnosis present

## 2014-09-03 DIAGNOSIS — E871 Hypo-osmolality and hyponatremia: Secondary | ICD-10-CM | POA: Diagnosis present

## 2014-09-03 DIAGNOSIS — D649 Anemia, unspecified: Secondary | ICD-10-CM | POA: Diagnosis present

## 2014-09-03 DIAGNOSIS — R509 Fever, unspecified: Secondary | ICD-10-CM

## 2014-09-03 DIAGNOSIS — M79661 Pain in right lower leg: Secondary | ICD-10-CM | POA: Diagnosis present

## 2014-09-03 DIAGNOSIS — L89323 Pressure ulcer of left buttock, stage 3: Secondary | ICD-10-CM | POA: Diagnosis present

## 2014-09-03 DIAGNOSIS — L03115 Cellulitis of right lower limb: Secondary | ICD-10-CM | POA: Diagnosis present

## 2014-09-03 DIAGNOSIS — N183 Chronic kidney disease, stage 3 unspecified: Secondary | ICD-10-CM | POA: Diagnosis present

## 2014-09-03 DIAGNOSIS — Z993 Dependence on wheelchair: Secondary | ICD-10-CM | POA: Diagnosis not present

## 2014-09-03 DIAGNOSIS — E1151 Type 2 diabetes mellitus with diabetic peripheral angiopathy without gangrene: Secondary | ICD-10-CM | POA: Diagnosis present

## 2014-09-03 DIAGNOSIS — E780 Pure hypercholesterolemia: Secondary | ICD-10-CM | POA: Diagnosis present

## 2014-09-03 DIAGNOSIS — L0390 Cellulitis, unspecified: Secondary | ICD-10-CM | POA: Diagnosis present

## 2014-09-03 DIAGNOSIS — E669 Obesity, unspecified: Secondary | ICD-10-CM | POA: Diagnosis present

## 2014-09-03 DIAGNOSIS — R0989 Other specified symptoms and signs involving the circulatory and respiratory systems: Secondary | ICD-10-CM

## 2014-09-03 DIAGNOSIS — Z8679 Personal history of other diseases of the circulatory system: Secondary | ICD-10-CM

## 2014-09-03 DIAGNOSIS — E1121 Type 2 diabetes mellitus with diabetic nephropathy: Secondary | ICD-10-CM | POA: Diagnosis present

## 2014-09-03 DIAGNOSIS — N184 Chronic kidney disease, stage 4 (severe): Secondary | ICD-10-CM | POA: Diagnosis present

## 2014-09-03 DIAGNOSIS — A419 Sepsis, unspecified organism: Principal | ICD-10-CM | POA: Diagnosis present

## 2014-09-03 DIAGNOSIS — M79604 Pain in right leg: Secondary | ICD-10-CM | POA: Diagnosis present

## 2014-09-03 DIAGNOSIS — R0600 Dyspnea, unspecified: Secondary | ICD-10-CM

## 2014-09-03 DIAGNOSIS — Z8673 Personal history of transient ischemic attack (TIA), and cerebral infarction without residual deficits: Secondary | ICD-10-CM

## 2014-09-03 DIAGNOSIS — I890 Lymphedema, not elsewhere classified: Secondary | ICD-10-CM

## 2014-09-03 DIAGNOSIS — D509 Iron deficiency anemia, unspecified: Secondary | ICD-10-CM | POA: Diagnosis present

## 2014-09-03 DIAGNOSIS — IMO0001 Reserved for inherently not codable concepts without codable children: Secondary | ICD-10-CM | POA: Insufficient documentation

## 2014-09-03 DIAGNOSIS — I2510 Atherosclerotic heart disease of native coronary artery without angina pectoris: Secondary | ICD-10-CM | POA: Diagnosis present

## 2014-09-03 DIAGNOSIS — I10 Essential (primary) hypertension: Secondary | ICD-10-CM | POA: Diagnosis present

## 2014-09-03 DIAGNOSIS — L98411 Non-pressure chronic ulcer of buttock limited to breakdown of skin: Secondary | ICD-10-CM

## 2014-09-03 DIAGNOSIS — E119 Type 2 diabetes mellitus without complications: Secondary | ICD-10-CM

## 2014-09-03 DIAGNOSIS — D573 Sickle-cell trait: Secondary | ICD-10-CM | POA: Diagnosis present

## 2014-09-03 DIAGNOSIS — L8990 Pressure ulcer of unspecified site, unspecified stage: Secondary | ICD-10-CM | POA: Diagnosis present

## 2014-09-03 DIAGNOSIS — L97913 Non-pressure chronic ulcer of unspecified part of right lower leg with necrosis of muscle: Secondary | ICD-10-CM

## 2014-09-03 DIAGNOSIS — G8929 Other chronic pain: Secondary | ICD-10-CM | POA: Diagnosis present

## 2014-09-03 HISTORY — DX: Peripheral vascular disease, unspecified: I73.9

## 2014-09-03 HISTORY — DX: Cerebral infarction, unspecified: I63.9

## 2014-09-03 HISTORY — DX: Obesity, unspecified: E66.9

## 2014-09-03 LAB — GLUCOSE, CAPILLARY
GLUCOSE-CAPILLARY: 131 mg/dL — AB (ref 70–99)
Glucose-Capillary: 155 mg/dL — ABNORMAL HIGH (ref 70–99)
Glucose-Capillary: 123 mg/dL — ABNORMAL HIGH (ref 70–99)

## 2014-09-03 LAB — CBC WITH DIFFERENTIAL/PLATELET
Basophils Absolute: 0 10*3/uL (ref 0.0–0.1)
Basophils Relative: 0 % (ref 0–1)
EOS PCT: 2 % (ref 0–5)
Eosinophils Absolute: 0.3 10*3/uL (ref 0.0–0.7)
HCT: 26.5 % — ABNORMAL LOW (ref 39.0–52.0)
HEMOGLOBIN: 8.5 g/dL — AB (ref 13.0–17.0)
LYMPHS ABS: 1.7 10*3/uL (ref 0.7–4.0)
Lymphocytes Relative: 9 % — ABNORMAL LOW (ref 12–46)
MCH: 21.5 pg — AB (ref 26.0–34.0)
MCHC: 32.1 g/dL (ref 30.0–36.0)
MCV: 66.9 fL — ABNORMAL LOW (ref 78.0–100.0)
MONO ABS: 1.5 10*3/uL — AB (ref 0.1–1.0)
MONOS PCT: 8 % (ref 3–12)
Neutro Abs: 14.8 10*3/uL — ABNORMAL HIGH (ref 1.7–7.7)
Neutrophils Relative %: 81 % — ABNORMAL HIGH (ref 43–77)
Platelets: 382 10*3/uL (ref 150–400)
RBC: 3.96 MIL/uL — AB (ref 4.22–5.81)
RDW: 14.4 % (ref 11.5–15.5)
WBC: 18.3 10*3/uL — AB (ref 4.0–10.5)

## 2014-09-03 LAB — COMPREHENSIVE METABOLIC PANEL
ALT: 9 U/L (ref 0–53)
AST: 13 U/L (ref 0–37)
Albumin: 2 g/dL — ABNORMAL LOW (ref 3.5–5.2)
Alkaline Phosphatase: 62 U/L (ref 39–117)
Anion gap: 9 (ref 5–15)
BUN: 55 mg/dL — ABNORMAL HIGH (ref 6–23)
CALCIUM: 8.6 mg/dL (ref 8.4–10.5)
CO2: 23 mmol/L (ref 19–32)
CREATININE: 2.46 mg/dL — AB (ref 0.50–1.35)
Chloride: 100 mmol/L (ref 96–112)
GFR calc Af Amer: 28 mL/min — ABNORMAL LOW (ref >90)
GFR calc non Af Amer: 24 mL/min — ABNORMAL LOW (ref >90)
Glucose, Bld: 170 mg/dL — ABNORMAL HIGH (ref 70–99)
Potassium: 4.6 mmol/L (ref 3.5–5.1)
Sodium: 132 mmol/L — ABNORMAL LOW (ref 135–145)
Total Bilirubin: 0.4 mg/dL (ref 0.3–1.2)
Total Protein: 8 g/dL (ref 6.0–8.3)

## 2014-09-03 LAB — URINALYSIS, ROUTINE W REFLEX MICROSCOPIC
BILIRUBIN URINE: NEGATIVE
Glucose, UA: NEGATIVE mg/dL
Hgb urine dipstick: NEGATIVE
KETONES UR: NEGATIVE mg/dL
LEUKOCYTES UA: NEGATIVE
NITRITE: NEGATIVE
PROTEIN: NEGATIVE mg/dL
SPECIFIC GRAVITY, URINE: 1.02 (ref 1.005–1.030)
UROBILINOGEN UA: 0.2 mg/dL (ref 0.0–1.0)
pH: 5 (ref 5.0–8.0)

## 2014-09-03 LAB — PROTIME-INR
INR: 1.38 (ref 0.00–1.49)
Prothrombin Time: 17.1 seconds — ABNORMAL HIGH (ref 11.6–15.2)

## 2014-09-03 LAB — APTT: aPTT: 41 seconds — ABNORMAL HIGH (ref 24–37)

## 2014-09-03 MED ORDER — INSULIN ASPART 100 UNIT/ML ~~LOC~~ SOLN
0.0000 [IU] | Freq: Three times a day (TID) | SUBCUTANEOUS | Status: DC
Start: 2014-09-03 — End: 2014-09-11
  Administered 2014-09-03: 2 [IU] via SUBCUTANEOUS
  Administered 2014-09-03 – 2014-09-04 (×2): 3 [IU] via SUBCUTANEOUS
  Administered 2014-09-04 (×2): 2 [IU] via SUBCUTANEOUS
  Administered 2014-09-05: 3 [IU] via SUBCUTANEOUS
  Administered 2014-09-05 – 2014-09-06 (×3): 2 [IU] via SUBCUTANEOUS
  Administered 2014-09-06: 3 [IU] via SUBCUTANEOUS
  Administered 2014-09-07 – 2014-09-10 (×4): 2 [IU] via SUBCUTANEOUS
  Administered 2014-09-11: 3 [IU] via SUBCUTANEOUS

## 2014-09-03 MED ORDER — FUROSEMIDE 10 MG/ML IJ SOLN
20.0000 mg | Freq: Once | INTRAMUSCULAR | Status: AC
Start: 2014-09-03 — End: 2014-09-03
  Administered 2014-09-03: 20 mg via INTRAVENOUS
  Filled 2014-09-03: qty 2

## 2014-09-03 MED ORDER — DOCUSATE SODIUM 100 MG PO CAPS
100.0000 mg | ORAL_CAPSULE | Freq: Two times a day (BID) | ORAL | Status: DC
  Administered 2014-09-03 – 2014-09-11 (×14): 100 mg via ORAL
  Filled 2014-09-03 (×15): qty 1

## 2014-09-03 MED ORDER — VANCOMYCIN HCL 10 G IV SOLR
1500.0000 mg | INTRAVENOUS | Status: DC
  Administered 2014-09-03 – 2014-09-05 (×2): 1500 mg via INTRAVENOUS
  Filled 2014-09-03 (×4): qty 1500

## 2014-09-03 MED ORDER — CLOPIDOGREL BISULFATE 75 MG PO TABS: 75.0000 mg | ORAL_TABLET | Freq: Every day | ORAL | Status: DC

## 2014-09-03 MED ORDER — ACETAMINOPHEN 650 MG RE SUPP: 650.0000 mg | Freq: Four times a day (QID) | RECTAL | Status: DC | PRN

## 2014-09-03 MED ORDER — ATORVASTATIN CALCIUM 20 MG PO TABS
20.0000 mg | ORAL_TABLET | Freq: Every day | ORAL | Status: DC
  Administered 2014-09-04 – 2014-09-11 (×7): 20 mg via ORAL
  Filled 2014-09-03 (×7): qty 1

## 2014-09-03 MED ORDER — MORPHINE SULFATE 2 MG/ML IJ SOLN
1.0000 mg | INTRAMUSCULAR | Status: DC | PRN
  Administered 2014-09-04: 1 mg via INTRAVENOUS
  Filled 2014-09-03: qty 1

## 2014-09-03 MED ORDER — PIPERACILLIN-TAZOBACTAM 3.375 G IVPB 30 MIN
3.3750 g | Freq: Once | INTRAVENOUS | Status: AC
  Administered 2014-09-03: 3.375 g via INTRAVENOUS
  Filled 2014-09-03: qty 50

## 2014-09-03 MED ORDER — INSULIN ASPART 100 UNIT/ML ~~LOC~~ SOLN: 0.0000 [IU] | Freq: Every day | SUBCUTANEOUS | Status: DC

## 2014-09-03 MED ORDER — ASPIRIN EC 81 MG PO TBEC: 81.0000 mg | DELAYED_RELEASE_TABLET | Freq: Every day | ORAL | Status: DC

## 2014-09-03 MED ORDER — PIPERACILLIN-TAZOBACTAM 3.375 G IVPB
3.3750 g | Freq: Three times a day (TID) | INTRAVENOUS | Status: DC
  Administered 2014-09-03 – 2014-09-08 (×15): 3.375 g via INTRAVENOUS
  Filled 2014-09-03 (×21): qty 50

## 2014-09-03 MED ORDER — ONDANSETRON HCL 4 MG PO TABS
4.0000 mg | ORAL_TABLET | Freq: Four times a day (QID) | ORAL | Status: DC | PRN
  Administered 2014-09-08: 4 mg via ORAL
  Filled 2014-09-03: qty 1

## 2014-09-03 MED ORDER — SODIUM CHLORIDE 0.9 % IV SOLN
INTRAVENOUS | Status: DC
  Administered 2014-09-03: 13:00:00 via INTRAVENOUS

## 2014-09-03 MED ORDER — FUROSEMIDE 40 MG PO TABS
40.0000 mg | ORAL_TABLET | Freq: Every day | ORAL | Status: DC
  Administered 2014-09-04 – 2014-09-11 (×7): 40 mg via ORAL
  Filled 2014-09-03 (×7): qty 1

## 2014-09-03 MED ORDER — ALUM & MAG HYDROXIDE-SIMETH 200-200-20 MG/5ML PO SUSP
30.0000 mL | Freq: Four times a day (QID) | ORAL | Status: DC | PRN
Start: 2014-09-03 — End: 2014-09-11

## 2014-09-03 MED ORDER — HEPARIN SODIUM (PORCINE) 5000 UNIT/ML IJ SOLN
5000.0000 [IU] | Freq: Three times a day (TID) | INTRAMUSCULAR | Status: AC
  Administered 2014-09-03: 5000 [IU] via SUBCUTANEOUS
  Filled 2014-09-03: qty 1

## 2014-09-03 MED ORDER — HYDROCODONE-ACETAMINOPHEN 5-325 MG PO TABS
1.0000 | ORAL_TABLET | ORAL | Status: DC | PRN
Start: 2014-09-03 — End: 2014-09-04
  Administered 2014-09-03 – 2014-09-04 (×2): 1 via ORAL
  Filled 2014-09-03 (×2): qty 1

## 2014-09-03 MED ORDER — TRAZODONE HCL 50 MG PO TABS
25.0000 mg | ORAL_TABLET | Freq: Every evening | ORAL | Status: DC | PRN
  Filled 2014-09-03 (×2): qty 1

## 2014-09-03 MED ORDER — FERROUS SULFATE 325 (65 FE) MG PO TABS
325.0000 mg | ORAL_TABLET | Freq: Every day | ORAL | Status: DC
  Administered 2014-09-04 – 2014-09-11 (×7): 325 mg via ORAL
  Filled 2014-09-03 (×7): qty 1

## 2014-09-03 MED ORDER — ACETAMINOPHEN 325 MG PO TABS
650.0000 mg | ORAL_TABLET | Freq: Four times a day (QID) | ORAL | Status: DC | PRN
  Filled 2014-09-03: qty 2

## 2014-09-03 MED ORDER — VANCOMYCIN HCL IN DEXTROSE 1-5 GM/200ML-% IV SOLN
1000.0000 mg | Freq: Once | INTRAVENOUS | Status: AC
  Administered 2014-09-03: 1000 mg via INTRAVENOUS
  Filled 2014-09-03: qty 200

## 2014-09-03 MED ORDER — ONDANSETRON HCL 4 MG/2ML IJ SOLN
4.0000 mg | Freq: Four times a day (QID) | INTRAMUSCULAR | Status: DC | PRN
  Administered 2014-09-08 – 2014-09-09 (×4): 4 mg via INTRAVENOUS
  Filled 2014-09-03 (×4): qty 2

## 2014-09-03 MED ORDER — MAGNESIUM HYDROXIDE 400 MG/5ML PO SUSP: 30.0000 mL | Freq: Every day | ORAL | Status: DC | PRN

## 2014-09-03 NOTE — ED Provider Notes (Signed)
Medical screening examination/treatment/procedure(s) were conducted as a shared visit with non-physician practitioner(s) and myself.  I personally evaluated the patient during the encounter.   EKG Interpretation None     Patient here with right lower extremity pain from chronic wound. On exam he has infection noted as well as swelling. Purulent drainage and foul smell noted. Patient will be started antibiotics and admitted to the hospitalist  Lorre NickAnthony Leida Luton, MD 09/03/14 [phone]

## 2014-09-03 NOTE — Progress Notes (Signed)
INITIAL NUTRITION ASSESSMENT  DOCUMENTATION CODES Per approved criteria  -Obesity Unspecified   INTERVENTION: -Ensure Enlive po BID, each supplement provides 350 kcal and 20 grams of protein   -Provided education outlined below  NUTRITION DIAGNOSIS: Increased protein needs related to chronic wound as evidenced by  estimated needs.  Goal: Pt to meet >/= 90% of their estimated nutrition needs    Monitor:  Po intake, labs and wt trends   Reason for Assessment: evaluate nutritional status and provide education   74 y.o. 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   Pt says he has someone who shops for him and prepares meals. He usually has cereal with milk for breakfast, a sandwich at lunch and hot meal in the evening. Pt has difficulty chewing due to poor dentition. He is able to feed himself.   Weight has been stable over past 6 months according to hospital records. Pt unable to provide recent weight.  Labs: elevated glucose 170, BUN-55 and Cr 2.46, sodium 132 (low)  Height: Ht Readings from Last 1 Encounters:  09/03/14 6' (1.829 m)    Weight: Wt Readings from Last 1 Encounters:  09/03/14 250 lb (113.399 kg)    Adjusted Ideal Body Weight: 168# (76.3 kg)  % Ideal Body Weight: 149%  Wt Readings from Last 10 Encounters:  09/03/14 250 lb (113.399 kg)  03/02/14 250 lb (113.399 kg)  06/15/12 260 lb 2.3 oz (118 kg)  06/06/12 285 lb (129.275 kg)  02/03/12 285 lb (129.275 kg)  06/05/08 284 lb 12 oz (129.162 kg)  11/15/07 231 lb (104.781 kg)    Usual Body Weight: 250-260#  % Usual Body Weight: 100%  BMI:  Body mass index is 33.9 kg/(m^2). obesity class I  Estimated Nutritional Needs: Kcal: 3664-4034 Protein: 90-105 gr Fluid: 1.9-2.1 liters daily  Skin: stage II to sacrum, chronic wound to right lower leg  Diet Order: Diet heart healthy/carb modified Room service  appropriate?: Yes; Fluid consistency:: Thin  EDUCATION NEEDS: -Education needs addressed related to label reading, increasing vegetables, lean protein and whole grain sources daily.  Handouts provided also related to "why what you eat is important when you have CKD" , "Healthy foods shopping list", "Meal ideas which contain appropriate CHO counts for men".   No intake or output data in the 24 hours ending 09/03/14 1533  Last BM: 09/02/14  Labs:   Recent Labs Lab 09/03/14 0815  NA 132*  K 4.6  CL 100  CO2 23  BUN 55*  CREATININE 2.46*  CALCIUM 8.6  GLUCOSE 170*    CBG (last 3)   Recent Labs  09/03/14 1214  GLUCAP 155*    Scheduled Meds: . [START ON 09/04/2014] aspirin EC  81 mg Oral Daily  . [START ON 09/04/2014] atorvastatin  20 mg Oral Daily  . [START ON 09/04/2014] clopidogrel  75 mg Oral Daily  . docusate sodium  100 mg Oral BID  . [START ON 09/04/2014] ferrous sulfate  325 mg Oral Daily  . [START ON 09/04/2014] furosemide  40 mg Oral Daily  . insulin aspart  0-15 Units Subcutaneous TID WC  . insulin aspart  0-5 Units Subcutaneous QHS  . piperacillin-tazobactam (ZOSYN)  IV  3.375 g Intravenous Q8H  . vancomycin  1,500 mg Intravenous Q48H    Continuous Infusions: . sodium chloride 50 mL/hr at 09/03/14 1300  Past Medical History  Diagnosis Date  . Amputated below knee   . Coronary artery disease   . Diabetes mellitus   . Hypertension   . Hypercholesteremia   . CVA (cerebral infarction)     2001  . Peripheral vascular disease   . Obesity     Past Surgical History  Procedure Laterality Date  . Amputation      Royann ShiversLynn Daisja Kessinger MS,RD,CSG,LDN Office: [phone] Pager: [phone]

## 2014-09-03 NOTE — ED Notes (Signed)
Pt called out to EMS for right leg wound/pain. Pt. Is wheelchair bound and reports wound to sacral area as well. Per EMS VSS, CBG 169.

## 2014-09-03 NOTE — ED Notes (Signed)
Pt unable to provide urine sample at this time, pt. Cleaned up and changed due to incontinence.

## 2014-09-03 NOTE — ED Provider Notes (Signed)
CSN: [id number]     Arrival date & time 09/03/14  0747 History   First MD Initiated Contact with Patient 09/03/14 0805     Chief Complaint  Patient presents with  . leg wound      (Consider location/radiation/quality/duration/timing/severity/associated sxs/prior Treatment) HPI Steven [name] is a 74 y.o. male with hx of diabetes, HTN and CAD with BKA left, presents to the ED with right leg wound and pain. He also reports wound to sacral area. Patient is w/c bound. Arrived to the ED via EMS.  Patient reports that he was admitted to the hospital here in October and then transferred to a rehab center in [place]. He stayed at the rehab center until March and then returned home. He reports that the right leg has gotten progressively worse and now he also has an area to the left buttock area that is extremely sore. Patient states that he tried to see Dr. Sudie Bailey last week but could not get in. Patient states that the pain has become so severe that he called EMS to bring him in today. He denies any other problems today.   Past Medical History  Diagnosis Date  . Amputated below knee   . Coronary artery disease   . Diabetes mellitus   . Hypertension   . Hypercholesteremia    Past Surgical History  Procedure Laterality Date  . Amputation     History reviewed. No pertinent family history. History  Substance Use Topics  . Smoking status: Former Games developer  . Smokeless tobacco: Not on file  . Alcohol Use: No    Review of Systems  Constitutional: Negative for fever and chills.  HENT: Negative.   Eyes: Negative for pain, redness and visual disturbance.  Respiratory: Negative for cough, chest tightness and shortness of breath.   Cardiovascular: Positive for leg swelling. Negative for chest pain.  Gastrointestinal: Negative for nausea, vomiting, abdominal pain and diarrhea.  Genitourinary: Negative for dysuria, urgency and frequency.  Musculoskeletal: Positive for joint swelling. Negative  for back pain. Gait problem: w/c bound.  Skin: Positive for wound.  Neurological: Negative for seizures, syncope and headaches.  Psychiatric/Behavioral: Negative for confusion. The patient is not nervous/anxious.       Allergies  Amlodipine besylate-valsartan  Home Medications   Prior to Admission medications   Medication Sig Start Date End Date Taking? Authorizing Provider  amLODipine (NORVASC) 10 MG tablet Take 10 mg by mouth daily.   Yes Historical Provider, MD  aspirin EC 81 MG tablet Take 81 mg by mouth daily.   Yes Historical Provider, MD  atorvastatin (LIPITOR) 20 MG tablet Take 20 mg by mouth daily.   Yes Historical Provider, MD  clopidogrel (PLAVIX) 75 MG tablet Take 75 mg by mouth daily.   Yes Historical Provider, MD  doxazosin (CARDURA) 4 MG tablet Take 4 mg by mouth at bedtime.   Yes Historical Provider, MD  ferrous sulfate 325 (65 FE) MG tablet Take 325 mg by mouth daily.   Yes Historical Provider, MD  furosemide (LASIX) 40 MG tablet Take 40 mg by mouth daily.   Yes Historical Provider, MD  hydrALAZINE (APRESOLINE) 100 MG tablet Take 100 mg by mouth 3 (three) times daily.   Yes Historical Provider, MD  HYDROcodone-acetaminophen (NORCO/VICODIN) 5-325 MG per tablet Take 1 tablet by mouth every 4 (four) hours as needed (pain).  08/13/14  Yes Historical Provider, MD  metFORMIN (GLUCOPHAGE) 500 MG tablet Take 500 mg by mouth 2 (two) times daily with a meal.  Yes Historical Provider, MD  metoprolol (LOPRESSOR) 100 MG tablet Take 200 mg by mouth 2 (two) times daily.   Yes Historical Provider, MD  collagenase (SANTYL) ointment Apply topically daily. Patient not taking: Reported on 09/03/2014 03/09/14   Henderson Cloud, MD   BP 112/62 mmHg  Pulse 81  Temp(Src) 98.3 F (36.8 C) (Oral)  Resp 22  Ht 6' (1.829 m)  Wt 250 lb (113.399 kg)  BMI 33.90 kg/m2  SpO2 93% Physical Exam  Constitutional: He is oriented to person, place, and time. No distress.  Obese, A/A male    HENT:  Head: Normocephalic and atraumatic.  Eyes: EOM are normal.  Neck: Normal range of motion. Neck supple.  Cardiovascular: Normal rate and regular rhythm.   hypotensive  Pulmonary/Chest: Effort normal. No respiratory distress. He has no wheezes. He has no rales.  Abdominal: Soft. There is no tenderness.  Genitourinary:     Ulcer area to left buttock  Musculoskeletal:  Left BKA Right lower leg with swelling, necrotic tissue with malodorous drainage.   Neurological: He is alert and oriented to person, place, and time. No cranial nerve deficit.  Skin: Skin is warm and dry.  Psychiatric: He has a normal mood and affect. His behavior is normal.  Nursing note and vitals reviewed.   ED Course  Procedures (including critical care time)  Labs, x-ray, EKG, wound culture, Zosyn, Vancomycin  Results for orders placed or performed during the hospital encounter of 09/03/14 (from the past 24 hour(s))  CBC with Differential     Status: Abnormal   Collection Time: 09/03/14  8:15 AM  Result Value Ref Range   WBC 18.3 (H) 4.0 - 10.5 K/uL   RBC 3.96 (L) 4.22 - 5.81 MIL/uL   Hemoglobin 8.5 (L) 13.0 - 17.0 g/dL   HCT 16.1 (L) 09.6 - 04.5 %   MCV 66.9 (L) 78.0 - 100.0 fL   MCH 21.5 (L) 26.0 - 34.0 pg   MCHC 32.1 30.0 - 36.0 g/dL   RDW 40.9 81.1 - 91.4 %   Platelets 382 150 - 400 K/uL   Neutrophils Relative % 81 (H) 43 - 77 %   Neutro Abs 14.8 (H) 1.7 - 7.7 K/uL   Lymphocytes Relative 9 (L) 12 - 46 %   Lymphs Abs 1.7 0.7 - 4.0 K/uL   Monocytes Relative 8 3 - 12 %   Monocytes Absolute 1.5 (H) 0.1 - 1.0 K/uL   Eosinophils Relative 2 0 - 5 %   Eosinophils Absolute 0.3 0.0 - 0.7 K/uL   Basophils Relative 0 0 - 1 %   Basophils Absolute 0.0 0.0 - 0.1 K/uL   RBC Morphology TARGET CELLS   Comprehensive metabolic panel     Status: Abnormal   Collection Time: 09/03/14  8:15 AM  Result Value Ref Range   Sodium 132 (L) 135 - 145 mmol/L   Potassium 4.6 3.5 - 5.1 mmol/L   Chloride 100 96 -  112 mmol/L   CO2 23 19 - 32 mmol/L   Glucose, Bld 170 (H) 70 - 99 mg/dL   BUN 55 (H) 6 - 23 mg/dL   Creatinine, Ser 7.82 (H) 0.50 - 1.35 mg/dL   Calcium 8.6 8.4 - 95.6 mg/dL   Total Protein 8.0 6.0 - 8.3 g/dL   Albumin 2.0 (L) 3.5 - 5.2 g/dL   AST 13 0 - 37 U/L   ALT 9 0 - 53 U/L   Alkaline Phosphatase 62 39 - 117 U/L  Total Bilirubin 0.4 0.3 - 1.2 mg/dL   GFR calc non Af Amer 24 (L) >90 mL/min   GFR calc Af Amer 28 (L) >90 mL/min   Anion gap 9 5 - 15    Imaging Review Dg Tibia/fibula Right  09/03/2014   CLINICAL DATA:  Chronic pain for 8 years.  No recent injury  EXAM: RIGHT TIBIA AND FIBULA - 2 VIEW  COMPARISON:  None.  FINDINGS: Frontal and lateral views were obtained. There is postoperative change in the medial malleolar region as well as in the distal fibula with alignment in these areas anatomic. No acute fracture or dislocation. There is a small knee joint effusion. There is no abnormal periosteal reaction. There are no blastic or lytic bone lesions. No osteomyelitis appreciable. There are multiple foci of calcification in the vascular structures of the right lower extremity. There appears to be soft tissue edema.  IMPRESSION: No acute fracture or dislocation. No erosive change or bony destruction. Small joint effusion in the knee region. Soft tissue edema. Multiple foci of arterial vascular calcification, most likely secondary to atherosclerotic change.   Electronically Signed   By: Bretta BangWilliam  Woodruff III M.D.   On: 09/03/2014 10:07   Consult with admitting physician and he will admit the patient to medical/surgical unit. Temporary admit orders written.   MDM  74 y.o. male with right lower extremity ulcers and necrosis. Discussed with the patient plan of admission and all questioned fully answered.  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 Campus[facility] M Brallan Denio,  NP 09/03/14 1120  Lorre NickAnthony Allen, MD 09/07/14 1539

## 2014-09-03 NOTE — Progress Notes (Signed)
ANTIBIOTIC CONSULT NOTE  Pharmacy Consult for Vancomycin & Zosyn Indication: wound infection  Allergies  Allergen Reactions  . Amlodipine Besylate-Valsartan     REACTION: renal deterioration (Patient does not recall this allergy. This medication is BRAND NAME EXFORGE which includes NORVASC (Amlodipine) which patient takes daily. Patient is not aware of the reaction, the medication listed, or the allergy itself. Advised patient to verify this medication allergy and reaction with physician.    Patient Measurements: Height: 6' (182.9 cm) Weight: 250 lb (113.399 kg) IBW/kg (Calculated) : 77.6  Vital Signs: Temp: 99.6 F (37.6 C) (04/04 1220) Temp Source: Oral (04/04 1220) BP: 117/54 mmHg (04/04 1220) Pulse Rate: 92 (04/04 1220) Intake/Output from previous day:   Intake/Output from this shift:    Labs:  Recent Labs  09/03/14 0815  WBC 18.3*  HGB 8.5*  PLT 382  CREATININE 2.46*   Estimated Creatinine Clearance: 34.2 mL/min (by C-G formula based on Cr of 2.46). No results for input(s): VANCOTROUGH, VANCOPEAK, VANCORANDOM, GENTTROUGH, GENTPEAK, GENTRANDOM, TOBRATROUGH, TOBRAPEAK, TOBRARND, AMIKACINPEAK, AMIKACINTROU, AMIKACIN in the last 72 hours.   Microbiology: No results found for this or any previous visit (from the past 720 hour(s)).  Anti-infectives    Start     Dose/Rate Route Frequency Ordered Stop   09/03/14 0900  piperacillin-tazobactam (ZOSYN) IVPB 3.375 g     3.375 g 100 mL/hr over 30 Minutes Intravenous  Once 09/03/14 0856 09/03/14 1009   09/03/14 0900  vancomycin (VANCOCIN) IVPB 1000 mg/200 mL premix     1,000 mg 200 mL/hr over 60 Minutes Intravenous  Once 09/03/14 0856 09/03/14 1049      Assessment: 74 yo obese M with hx PVD s/p left BKA and chronic right leg wound.  He presents with RLE edema, erythema, necrotic tissue, and malodorous drainage.  No bone involvement per Xray.  He is afebrile with elevated WBC.  Chronic kidney disease noted. NCrCl ~  25-4430ml/min Antibiotics initiated in ED.  Vancomycin 4/4>> Zosyn 4/4>>  Goal of Therapy:  Vancomycin trough level 15-20 mcg/ml  Plan:  Zosyn 3.375gm IV Q8h to be infused over 4hrs Vancomycin 1500mg  IV q48h (start now for total 2500mg  IV loading dose today) Check Vancomycin trough at steady state Monitor renal function and cx data   Elson ClanLilliston, Emeline Simpson Michelle 09/03/2014,12:24 PM

## 2014-09-03 NOTE — ED Notes (Signed)
Nurse busy in a pt's room per secretary, will call back for report.

## 2014-09-03 NOTE — H&P (Signed)
[facility] History and Physical  Steven PinksJoseph Dolbow ZHY:[id number] DOB: February 01, 1941 DOA: 09/03/2014  Referring physician:  PCP: Milana ObeyKNOWLTON,STEPHEN D, MD   Chief Complaint: right leg pain  HPI: Steven PinksJoseph [name] is a 74 y.o. 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 Initial evaluation in the emergency department reveals leukocytosis of 18.3 hyponatremia by 132 and right lower extremity with edema, erythema, necrotic tissue, malodorous drainage and extreme tenderness. Since states that he was in a rehabilitation center from October of last year until about 3 weeks ago when he was discharged to home. He is wheelchair bound. He states he has chronic pain in his lower right leg but over the last 3-4 days it has increased. He would take his usual pain pill without relief. Rate is located right lower extremity below the knee particularly lateral aspect at the site of his chronic wound. As constant and worsening. Associated symptoms include swelling redness decreased range of motion to the right lower extremity. He denies fever chills nausea vomiting diarrhea. He denies headache visual disturbances numbness or tingling of his extremities. States he called EMS when the pain became unbearable. Workup in the emergency department includes complete blood count significant for WBC 18.3 hemoglobin 8.5, complete metabolic panel significant for sodium 132 creatinine 2.46 serum glucose 170. X-ray of the right tibia/fibula negative for acute fracture or dislocation but shows small joint effusion at the knee, soft tissue edema, multiple foci of arterial vascular calcification. In the emergency department he is afebrile hemodynamically stable with a  blood pressure at the low end of normal he is not hypoxic. He is provided with Zosyn and vancomycin.  Review of Systems:  10 point review of systems completed and all systems are negative except as indicated in the history of present illness  Past Medical History  Diagnosis Date  . Amputated below knee   . Coronary artery disease   . Diabetes mellitus   . Hypertension   . Hypercholesteremia    Past Surgical History  Procedure Laterality Date  . Amputation     Social History:  reports that he has quit smoking. He does not have any smokeless tobacco history on file. He reports that he does not drink alcohol or use illicit drugs. He lives at home alone he is wheelchair bound. He indicates he has a daughter who lives in [place] and friends closer in the area who help him with cooking cleaning shopping getting to his doctor's appointments. Allergies  Allergen Reactions  . Amlodipine Besylate-Valsartan     REACTION: renal deterioration (Patient does not recall this allergy. This medication is BRAND NAME EXFORGE which includes NORVASC (Amlodipine) which patient takes daily. Patient is not aware of the reaction, the medication listed, or the allergy itself. Advised patient to verify this medication allergy and reaction with physician.    History reviewed. No pertinent family history. parents are deceased. Mother died from complications of diabetes father's past medical history unknown he has no siblings  Prior to Admission medications   Medication Sig Start Date End Date Taking? Authorizing Provider  amLODipine (NORVASC) 10 MG tablet Take 10 mg by mouth daily.   Yes Historical Provider, MD  aspirin EC 81 MG tablet Take 81 mg by mouth daily.   Yes Historical Provider, MD  atorvastatin (LIPITOR) 20 MG tablet Take 20 mg by mouth daily.   Yes Historical Provider, MD  clopidogrel (PLAVIX) 75 MG tablet Take 75 mg by mouth daily.   Yes Historical Provider, MD  doxazosin (CARDURA) 4 MG tablet  Take 4 mg by mouth at bedtime.   Yes Historical Provider, MD  ferrous sulfate 325 (65 FE) MG tablet Take 325 mg by mouth daily.   Yes Historical Provider, MD  furosemide (LASIX) 40 MG tablet Take 40 mg by mouth daily.   Yes Historical Provider, MD  hydrALAZINE (APRESOLINE) 100 MG tablet Take 100 mg by mouth 3 (three) times daily.   Yes Historical Provider, MD  HYDROcodone-acetaminophen (NORCO/VICODIN) 5-325 MG per tablet Take 1 tablet by mouth every 4 (four) hours as needed (pain).  08/13/14  Yes Historical Provider, MD  metFORMIN (GLUCOPHAGE) 500 MG tablet Take 500 mg by mouth 2 (two) times daily with a meal.   Yes Historical Provider, MD  metoprolol (LOPRESSOR) 100 MG tablet Take 200 mg by mouth 2 (two) times daily.   Yes Historical Provider, MD  collagenase (SANTYL) ointment Apply topically daily. Patient not taking: Reported on 09/03/2014 03/09/14   Henderson Cloud, MD   Physical Exam: Filed Vitals:   09/03/14 0930 09/03/14 1000 09/03/14 1030 09/03/14 1100  BP: 128/77 119/65 113/69 112/62  Pulse: 86 84 87 81  Temp:      TempSrc:      Resp:  Height:      Weight:      SpO2: 97% 96% 94% 93%    Wt Readings from Last 3 Encounters:  09/03/14 113.399 kg (250 lb)  03/02/14 113.399 kg (250 lb)  06/15/12 118 kg (260 lb 2.3 oz)    General:  Appears calm and comfortable Eyes: PERRL, normal lids, irises & conjunctiva ENT: grossly normal hearing, lips & tongue, very poor dentition Neck: no LAD, masses or thyromegaly Cardiovascular: RRR, no m/r/g. Left BKA. Right pedal pulse not palpable top of foot is warm toes slightly cool. Right leg extremely tender to touch posteriorly from the knee down as well as the right heel  Respiratory: Normal respiratory effort with mild use of abdominal accessory muscles with conversation. Rest sounds are distant but clear no wheeze Abdomen: Obese, soft, ntnd positive bowel sounds throughout Skin: Right leg skin is very thick dry flaking, right  heel thick with separation three quarters of the way skin underneath slightly pale no drainage or odor, lateral aspect of right leg with large open wound extending from mid shin down to his ankle tissue mostly pale with moderate Bethsaida Siegenthaler area year the center, moderate amount thick gray drainage with foul odor Musculoskeletal: grossly normal tone BUE/BLE Psychiatric: grossly normal mood and affect, speech fluent and appropriate Neurologic: grossly non-focal.          Labs on Admission:  Basic Metabolic Panel:  Recent Labs Lab 09/03/14 0815  NA 132*  K 4.6  CL 100  CO2 23  GLUCOSE 170*  BUN 55*  CREATININE 2.46*  CALCIUM 8.6   Liver Function Tests:  Recent Labs Lab 09/03/14 0815  AST 13  ALT 9  ALKPHOS 62  BILITOT 0.4  PROT 8.0  ALBUMIN 2.0*   No results for input(s): LIPASE, AMYLASE in the last 168 hours. No results for input(s): AMMONIA in the last 168 hours. CBC:  Recent Labs Lab 09/03/14 0815  WBC 18.3*  NEUTROABS 14.8*  HGB 8.5*  HCT 26.5*  MCV 66.9*  PLT  382   Cardiac Enzymes: No results for input(s): CKTOTAL, CKMB, CKMBINDEX, TROPONINI in the last 168 hours.  BNP (last 3 results) No results for input(s): BNP in the last 8760 hours.  ProBNP (last 3 results) No results for input(s): PROBNP in the last 8760 hours.  CBG: No results for input(s): GLUCAP in the last 168 hours.  Radiological Exams on Admission: Dg Tibia/fibula Right  09/03/2014   CLINICAL DATA:  Chronic pain for 8 years.  No recent injury  EXAM: RIGHT TIBIA AND FIBULA - 2 VIEW  COMPARISON:  None.  FINDINGS: Frontal and lateral views were obtained. There is postoperative change in the medial malleolar region as well as in the distal fibula with alignment in these areas anatomic. No acute fracture or dislocation. There is a small knee joint effusion. There is no abnormal periosteal reaction. There are no blastic or lytic bone lesions. No osteomyelitis appreciable. There are multiple foci of  calcification in the vascular structures of the right lower extremity. There appears to be soft tissue edema.  IMPRESSION: No acute fracture or dislocation. No erosive change or bony destruction. Small joint effusion in the knee region. Soft tissue edema. Multiple foci of arterial vascular calcification, most likely secondary to atherosclerotic change.   Electronically Signed   By: Bretta Bang III M.D.   On: 09/03/2014 10:07    EKG:   Assessment/Plan Principal Problem:   Leg pain, right: Acute on chronic related to wound infection in the setting of possible worsening PVD. Will admit to medical floor. Will continue the vancomycin and Zosyn initiated in the emergency department. Will request arterial ultrasound to assess perfusion of that right lower extremity. Will request general surgical consult. Will also request wound consult. Time of my exam he is afebrile, no dynamically stable and not toxic appearing. Analgesics as needed Active Problems: Essential hypertension: Blood pressures at the low end of normal on admission. Patient does report taking his medications this morning before coming to the hospital. Those include Norvasc, Cardura, Lasix, Apresoline, metoprolol. Will hold these for now and follow his blood pressure closely. Will resume medications as indicated.   Cellulitis; right lower extremity with underlying erythema tenderness and heat. Antibiotics as noted above.    Hyponatremia: Mild. Rports decreased by mouth intake yesterday. We'll very gently hydrate and recheck in the morning.    Diabetes; will obtain a hemoglobin A1c. He is on metformin and will hold this for now. Use sliding scale insulin for optimal control    Anemia: Microcytic. Current hemoglobin appears to be at baseline. No obvious sign of bleeding. Will continue iron supplement. Check FOBT      KIDNEY DISEASE, CHRONIC, STAGE III: Appears stable at baseline. Will monitor intake and output.  CVA 2001. On  Plavix  Peripheral vascular disease: Chart review indicates  ultrasound of the right leg in 2012 revealed abnormal exam of the right lower extremity demonstrating abnormal wave forms throughout the right leg to the ankle, with noncompressible dorsalis pedis artery at right ankle. No significant pressure gradients are identified below the right groin. Diminished right thigh pressure versus brachial, suggesting inflow aorta iliac disease. Repeat this study as noted above.  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   General surgery   Code Status: DNR DVT Prophylaxis: Family Communication: none present Disposition Plan: will likely need snf  Time spent: 65 minutes  [facility] M [facility] Pager  [phone]    

## 2014-09-04 ENCOUNTER — Ambulatory Visit (HOSPITAL_COMMUNITY): Payer: Medicaid Other | Attending: Family Medicine | Admitting: Physical Therapy

## 2014-09-04 DIAGNOSIS — I890 Lymphedema, not elsewhere classified: Secondary | ICD-10-CM

## 2014-09-04 DIAGNOSIS — A419 Sepsis, unspecified organism: Principal | ICD-10-CM

## 2014-09-04 DIAGNOSIS — IMO0001 Reserved for inherently not codable concepts without codable children: Secondary | ICD-10-CM | POA: Insufficient documentation

## 2014-09-04 LAB — GLUCOSE, CAPILLARY
Glucose-Capillary: 136 mg/dL — ABNORMAL HIGH (ref 70–99)
Glucose-Capillary: 194 mg/dL — ABNORMAL HIGH (ref 70–99)
Glucose-Capillary: 143 mg/dL — ABNORMAL HIGH (ref 70–99)

## 2014-09-04 LAB — BASIC METABOLIC PANEL
ANION GAP: 8 (ref 5–15)
BUN: 53 mg/dL — AB (ref 6–23)
CHLORIDE: 104 mmol/L (ref 96–112)
CO2: 23 mmol/L (ref 19–32)
Calcium: 8.2 mg/dL — ABNORMAL LOW (ref 8.4–10.5)
Creatinine, Ser: 2.59 mg/dL — ABNORMAL HIGH (ref 0.50–1.35)
GFR calc non Af Amer: 23 mL/min — ABNORMAL LOW (ref >90)
GFR, EST AFRICAN AMERICAN: 26 mL/min — AB (ref >90)
Glucose, Bld: 174 mg/dL — ABNORMAL HIGH (ref 70–99)
POTASSIUM: 4.1 mmol/L (ref 3.5–5.1)
SODIUM: 135 mmol/L (ref 135–145)

## 2014-09-04 LAB — CBC
HCT: 23.4 % — ABNORMAL LOW (ref 39.0–52.0)
Hemoglobin: 7.6 g/dL — ABNORMAL LOW (ref 13.0–17.0)
MCH: 21.6 pg — ABNORMAL LOW (ref 26.0–34.0)
MCHC: 32.5 g/dL (ref 30.0–36.0)
MCV: 66.5 fL — AB (ref 78.0–100.0)
PLATELETS: 325 10*3/uL (ref 150–400)
RBC: 3.52 MIL/uL — ABNORMAL LOW (ref 4.22–5.81)
RDW: 14.2 % (ref 11.5–15.5)
WBC: 16.3 10*3/uL — AB (ref 4.0–10.5)

## 2014-09-04 LAB — HEMOGLOBIN A1C
Hgb A1c MFr Bld: 7.1 % — ABNORMAL HIGH (ref 4.8–5.6)
Mean Plasma Glucose: 157 mg/dL

## 2014-09-04 MED ORDER — MORPHINE SULFATE 2 MG/ML IJ SOLN
1.0000 mg | INTRAMUSCULAR | Status: DC | PRN
  Administered 2014-09-06 – 2014-09-07 (×3): 1 mg via INTRAVENOUS
  Filled 2014-09-04 (×3): qty 1

## 2014-09-04 MED ORDER — HYDROCODONE-ACETAMINOPHEN 5-325 MG PO TABS
1.0000 | ORAL_TABLET | ORAL | Status: DC | PRN
  Administered 2014-09-05: 2 via ORAL
  Administered 2014-09-05: 1 via ORAL
  Administered 2014-09-05: 2 via ORAL
  Administered 2014-09-06: 1 via ORAL
  Administered 2014-09-06 – 2014-09-11 (×9): 2 via ORAL
  Filled 2014-09-04 (×4): qty 2
  Filled 2014-09-04: qty 1
  Filled 2014-09-04: qty 2
  Filled 2014-09-04: qty 1
  Filled 2014-09-04 (×6): qty 2

## 2014-09-04 NOTE — Progress Notes (Signed)
[facility] PROGRESS NOTE  Steven [name] ZOX:[id number] DOB: 1941-05-03 DOA: 09/03/2014 PCP: Milana Obey, MD  Assessment/Plan: Right lower extremity nonhealing ischemic wound in setting of cellulitis, lymphedema and diabetes. Max temp 101.7. Hemodynamically stable. Non-toxic appearing. Leukocytosis improving. Continue vancomycin and Zosyn day #2. Arterial ultrasound dorsalis pedis and posterior tibial arterial blood flow could not be detected. Discussed with vascular surgery. Patient felt not to be candidate for revascularization. Evaluated by general surgical who opine AKA required. Patient thinking about it and Plavix and aspirin stopped per general surgery in anticipation of surgery.  Active Problems: Essential hypertension: controlled. Home meds include Norvasc, Cardura, Lasix, Apresoline, metoprolol. Mildly tachycardic but this may be related to fever. Continue to monitor closely. Consider resuming metoprolol at lower dose.   Cellulitis; right lower extremity with underlying erythema tenderness and heat. Antibiotics as noted above.   Hyponatremia: resolved. monitr   Diabetes; Hemoglobin A1c 7.1. CBG range 136. Continue sliding scale insulin for optimal control. Hold metformin   Anemia: Microcytic. Hg trending down below baseline and what it was on admission.  No obvious sign of bleeding. Will likely need transfusion prior to surgery.  Will continue iron supplement. Check FOBT   KIDNEY DISEASE, CHRONIC, STAGE III: Appears stable at baseline. Will monitor intake and output.  CVA 2001. On Plavix. Discontinued per general surgery in anticipation of surgery  Peripheral vascular disease: chronic. See #1. 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 Stable.   Code Status: DNR Family Communication: none present Disposition Plan: snf when  ready   Consultants:  General surgery  Procedures:  none  Antibiotics:  Vancomycin 09/03/14>>  Zosyn 09/03/14>>  HPI/Subjective: Complains of pain with repositioning. Requesting food.   Objective: Filed Vitals:   09/04/14 0225  BP: 112/55  Pulse: 103  Temp: 101.7 F (38.7 C)  Resp: 20   No intake or output data in the 24 hours ending 09/04/14 0855 Filed Weights   09/03/14 0749 09/04/14 0225  Weight: 113.399 kg (250 lb) 104.373 kg (230 lb 1.6 oz)    Exam:   General:  Obese appears comfortable  Cardiovascular: RRR no m/g/r left BKA right PP not palpable. Right leg tender/swollen  Respiratory: normal effort BS clear bilaterally  Abdomen: obese soft +BS  Musculoskeletal: no clubbing or cyanosis  Skin: right lower extremity with thick dry cracked hyperkeratotic skin form knee to foot. Lymphedema, tenderness. Right heal with dangling skin, malodorous wound lateral aspect for top of ankle to mid shin.   Data Reviewed: Basic Metabolic Panel:  Recent Labs Lab 09/03/14 0815 09/04/14 0531  NA 132* 135  K 4.6 4.1  CL 100 104  CO2 23 23  GLUCOSE 170* 174*  BUN 55* 53*  CREATININE 2.46* 2.59*  CALCIUM 8.6 8.2*   Liver Function Tests:  Recent Labs Lab 09/03/14 0815  AST 13  ALT 9  ALKPHOS 62  BILITOT 0.4  PROT 8.0  ALBUMIN 2.0*   No results for input(s): LIPASE, AMYLASE in the last 168 hours. No results for input(s): AMMONIA in the last 168 hours. CBC:  Recent Labs Lab 09/03/14 0815 09/04/14 0531  WBC 18.3* 16.3*  NEUTROABS 14.8*  --   HGB 8.5* 7.6*  HCT 26.5* 23.4*  MCV 66.9* 66.5*  PLT 382 325   Cardiac Enzymes: No results for input(s): CKTOTAL, CKMB, CKMBINDEX, TROPONINI in the last 168 hours. BNP (last 3 results)  No results for input(s): BNP in the last 8760 hours.  ProBNP (last 3 results) No results for input(s): PROBNP in the last 8760 hours.  CBG:  Recent Labs Lab 09/03/14 1214 09/03/14 1701 09/03/14 2141 09/04/14 0809   GLUCAP 155* 131* 123* 136*    Recent Results (from the past 240 hour(s))  Wound culture     Status: None (Preliminary result)   Collection Time: 09/03/14  9:30 AM  Result Value Ref Range Status   Specimen Description WOUND RIGHT LEG  Final   Special Requests NONE  Final   Gram Stain PENDING  Incomplete   Culture   Final    Culture reincubated for better growth Performed at [facility]    Report Status PENDING  Incomplete     Studies: Dg Chest 1 View  09/03/2014   CLINICAL DATA:  Dyspnea  EXAM: CHEST  1 VIEW  COMPARISON:  03/02/2014  FINDINGS: Atherosclerotic aortic arch. Upper normal heart size. The lungs appear clear. No pleural effusion identified.  IMPRESSION: 1. Tortuous thoracic aorta. Upper normal heart size. Otherwise, no significant abnormalities are observed.   Electronically Signed   By: Gaylyn RongWalter  Liebkemann M.D.   On: 09/03/2014 15:03   Dg Tibia/fibula Right  09/03/2014   CLINICAL DATA:  Chronic pain for 8 years.  No recent injury  EXAM: RIGHT TIBIA AND FIBULA - 2 VIEW  COMPARISON:  None.  FINDINGS: Frontal and lateral views were obtained. There is postoperative change in the medial malleolar region as well as in the distal fibula with alignment in these areas anatomic. No acute fracture or dislocation. There is a small knee joint effusion. There is no abnormal periosteal reaction. There are no blastic or lytic bone lesions. No osteomyelitis appreciable. There are multiple foci of calcification in the vascular structures of the right lower extremity. There appears to be soft tissue edema.  IMPRESSION: No acute fracture or dislocation. No erosive change or bony destruction. Small joint effusion in the knee region. Soft tissue edema. Multiple foci of arterial vascular calcification, most likely secondary to atherosclerotic change.   Electronically Signed   By: Bretta BangWilliam  Woodruff III M.D.   On: 09/03/2014 10:07   [place] Arterial Seg Single  09/03/2014   CLINICAL DATA:  Chronic  nonhealing right lower extremity wound. History of left below-knee amputation.  EXAM: NONINVASIVE PHYSIOLOGIC VASCULAR STUDY OF RIGHT LOWER EXTREMITY  TECHNIQUE: Evaluation of the right lower extremity was performed at rest.  COMPARISON:  06/16/2010  FINDINGS: Despite multiple attempts, there was inability to obtain a waveform or pressure at the level of the dorsalis pedis artery or posterior tibial artery. The ankle-brachial index therefore could not be calculated. No Doppler signal was obtained.  IMPRESSION: Lack of Doppler signal at the level of the right dorsalis pedis and posterior tibial arteries. Ankle-brachial index could not be calculated.   Electronically Signed   By: Irish LackGlenn  Yamagata M.D.   On: 09/03/2014 14:54    Scheduled Meds: . atorvastatin  20 mg Oral Daily  . docusate sodium  100 mg Oral BID  . ferrous sulfate  325 mg Oral Daily  . furosemide  40 mg Oral Daily  . insulin aspart  0-15 Units Subcutaneous TID WC  . insulin aspart  0-5 Units Subcutaneous QHS  . piperacillin-tazobactam (ZOSYN)  IV  3.375 g Intravenous Q8H  . 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 If 7PM-7AM, please contact night-coverage at [email or web address], password [facility] 09/04/2014, 8:55 AM  LOS: 1 day

## 2014-09-04 NOTE — Care Management Note (Addendum)
    Page 1 of 1   09/11/2014     9:42:16 AM CARE MANAGEMENT NOTE 09/11/2014  Patient:  Steven [name],Steven [name]   Account Number:  [id number]  Date Initiated:  09/04/2014  Documentation initiated by:  Kathyrn SheriffHILDRESS,JESSICA  Subjective/Objective Assessment:   Pt is from home, lives alone and previously indpednent with ADL's. Pt uses a wheelchair and has a hospital bed. Pt admitted for infected wound and will undergo a second amputation. Pt says he has never had HH services.     Action/Plan:   Pt is unsure if he'll be able to return home at discharge. Pt will work with patient following surgery. Will cont to follow for CM needs.   Anticipated DC Date:  09/11/2014   Anticipated DC Plan:  SKILLED NURSING FACILITY  In-house referral  Clinical Social Worker      DC Planning Services  CM consult      Choice offered to / List presented to:             Status of service:  Completed, signed off Medicare Important Message given?  YES (If response is "NO", the following Medicare IM given date fields will be blank) Date Medicare IM given:  09/07/2014 Medicare IM given by:  Kathyrn SheriffHILDRESS,JESSICA Date Additional Medicare IM given:  09/11/2014 Additional Medicare IM given by:  JESSICA CHILDRESS  Discharge Disposition:  SKILLED NURSING FACILITY  Per UR Regulation:  Reviewed for med. necessity/level of care/duration of stay  If discussed at Long Length of Stay Meetings, dates discussed:   09/11/2014    Comments:  09/11/2014 0930 Kathyrn SheriffJessica Childress, RN, MNS, CM Pt discharing today to SNF. CSW is aware and have arranged for placement. No CM needs. 09/07/2014 0900 Kathyrn SheriffJessica Childress, RN, MSN, CM Pt having amputation today. SNF expected at discharge. CSW aware and finding placement for discharge. Discharge not expected over weekend. 09/04/2014 1230 Kathyrn SheriffJessica Childress, RN, MSN, CM

## 2014-09-04 NOTE — Consult Note (Signed)
Reason for Consult: Cellulitis, right lower extremity Referring Physician: Hospitalist  Steven [name] is an 74 y.o. male.  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 He states this has been present for a while, but the pain got worse. He is wheelchair bound and states he has not used his right leg to ambulate.  Past Medical History  Diagnosis Date  . Amputated below knee   . Coronary artery disease   . Diabetes mellitus   . Hypertension   . Hypercholesteremia   . CVA (cerebral infarction)     2001  . Peripheral vascular disease   . Obesity     Past Surgical History  Procedure Laterality Date  . Amputation      History reviewed. No pertinent family history.  Social History:  reports that he has quit smoking. He does not have any smokeless tobacco history on file. He reports that he does not drink alcohol or use illicit drugs.  Allergies:  Allergies  Allergen Reactions  . Amlodipine Besylate-Valsartan     REACTION: renal deterioration (Patient does not recall this allergy. This medication is BRAND NAME EXFORGE which includes NORVASC (Amlodipine) which patient takes daily. Patient is not aware of the reaction, the medication listed, or the allergy itself. Advised patient to verify this medication allergy and reaction with physician.    Medications: I have reviewed the patient's current medications.  Results for orders placed or performed during the hospital encounter of 09/03/14 (from the past 48 hour(s))  APTT     Status: Abnormal   Collection Time: 09/03/14  7:58 AM  Result Value Ref Range   aPTT 41 (H) 24 - 37 seconds    Comment:        IF BASELINE aPTT IS ELEVATED, SUGGEST PATIENT RISK ASSESSMENT BE USED TO DETERMINE APPROPRIATE ANTICOAGULANT THERAPY.   Protime-INR     Status: Abnormal   Collection Time:  09/03/14  7:58 AM  Result Value Ref Range   Prothrombin Time 17.1 (H) 11.6 - 15.2 seconds   INR 1.38 0.00 - 1.49  Hemoglobin A1c     Status: Abnormal   Collection Time: 09/03/14  7:58 AM  Result Value Ref Range   Hgb A1c MFr Bld 7.1 (H) 4.8 - 5.6 %    Comment: (NOTE)         Pre-diabetes: 5.7 - 6.4         Diabetes: >6.4         Glycemic control for adults with diabetes: <7.0    Mean Plasma Glucose 157 mg/dL    Comment: (NOTE) Performed At: [facility] [address] [place], [place] [id number] Lindon Romp MD DH:[id number]   CBC with Differential     Status: Abnormal   Collection Time: 09/03/14  8:15 AM  Result Value Ref Range   WBC 18.3 (H) 4.0 - 10.5 K/uL   RBC 3.96 (L) 4.22 - 5.81 MIL/uL   Hemoglobin 8.5 (L) 13.0 - 17.0 g/dL   HCT 26.5 (L) 39.0 - 52.0 %   MCV 66.9 (L) 78.0 - 100.0 fL   MCH 21.5 (L) 26.0 - 34.0 pg   MCHC 32.1 30.0 - 36.0 g/dL   RDW 14.4 11.5 - 15.5 %   Platelets 382 150 - 400 K/uL   Neutrophils Relative % 81 (H) 43 - 77 %  Neutro Abs 14.8 (H) 1.7 - 7.7 K/uL   Lymphocytes Relative 9 (L) 12 - 46 %   Lymphs Abs 1.7 0.7 - 4.0 K/uL   Monocytes Relative 8 3 - 12 %   Monocytes Absolute 1.5 (H) 0.1 - 1.0 K/uL   Eosinophils Relative 2 0 - 5 %   Eosinophils Absolute 0.3 0.0 - 0.7 K/uL   Basophils Relative 0 0 - 1 %   Basophils Absolute 0.0 0.0 - 0.1 K/uL   RBC Morphology TARGET CELLS     Comment: SCHISTOCYTES NOTED ON SMEAR ROULEAUX POLYCHROMASIA PRESENT   Comprehensive metabolic panel     Status: Abnormal   Collection Time: 09/03/14  8:15 AM  Result Value Ref Range   Sodium 132 (L) 135 - 145 mmol/L   Potassium 4.6 3.5 - 5.1 mmol/L   Chloride 100 96 - 112 mmol/L   CO2 23 19 - 32 mmol/L   Glucose, Bld 170 (H) 70 - 99 mg/dL   BUN 55 (H) 6 - 23 mg/dL   Creatinine, Ser 2.46 (H) 0.50 - 1.35 mg/dL   Calcium 8.6 8.4 - 10.5 mg/dL   Total Protein 8.0 6.0 - 8.3 g/dL   Albumin 2.0 (L) 3.5 - 5.2 g/dL   AST 13 0 - 37 U/L   ALT 9 0 - 53 U/L    Alkaline Phosphatase 62 39 - 117 U/L   Total Bilirubin 0.4 0.3 - 1.2 mg/dL   GFR calc non Af Amer 24 (L) >90 mL/min   GFR calc Af Amer 28 (L) >90 mL/min    Comment: (NOTE) The eGFR has been calculated using the CKD EPI equation. This calculation has not been validated in all clinical situations. eGFR's persistently <90 mL/min signify possible Chronic Kidney Disease.    Anion gap 9 5 - 15  Glucose, capillary     Status: Abnormal   Collection Time: 09/03/14 12:14 PM  Result Value Ref Range   Glucose-Capillary 155 (H) 70 - 99 mg/dL   Comment 1 Notify RN   Urinalysis, Routine w reflex microscopic     Status: None   Collection Time: 09/03/14  3:10 PM  Result Value Ref Range   Color, Urine YELLOW YELLOW   APPearance CLEAR CLEAR   Specific Gravity, Urine 1.020 1.005 - 1.030   pH 5.0 5.0 - 8.0   Glucose, UA NEGATIVE NEGATIVE mg/dL   Hgb urine dipstick NEGATIVE NEGATIVE   Bilirubin Urine NEGATIVE NEGATIVE   Ketones, ur NEGATIVE NEGATIVE mg/dL   Protein, ur NEGATIVE NEGATIVE mg/dL   Urobilinogen, UA 0.2 0.0 - 1.0 mg/dL   Nitrite NEGATIVE NEGATIVE   Leukocytes, UA NEGATIVE NEGATIVE    Comment: MICROSCOPIC NOT DONE ON URINES WITH NEGATIVE PROTEIN, BLOOD, LEUKOCYTES, NITRITE, OR GLUCOSE <1000 mg/dL.  Glucose, capillary     Status: Abnormal   Collection Time: 09/03/14  5:01 PM  Result Value Ref Range   Glucose-Capillary 131 (H) 70 - 99 mg/dL   Comment 1 Notify RN   Glucose, capillary     Status: Abnormal   Collection Time: 09/03/14  9:41 PM  Result Value Ref Range   Glucose-Capillary 123 (H) 70 - 99 mg/dL  Basic metabolic panel     Status: Abnormal   Collection Time: 09/04/14  5:31 AM  Result Value Ref Range   Sodium 135 135 - 145 mmol/L   Potassium 4.1 3.5 - 5.1 mmol/L   Chloride 104 96 - 112 mmol/L   CO2 23 19 - 32 mmol/L   Glucose,  Bld 174 (H) 70 - 99 mg/dL   BUN 53 (H) 6 - 23 mg/dL   Creatinine, Ser 2.59 (H) 0.50 - 1.35 mg/dL   Calcium 8.2 (L) 8.4 - 10.5 mg/dL   GFR calc non  Af Amer 23 (L) >90 mL/min   GFR calc Af Amer 26 (L) >90 mL/min    Comment: (NOTE) The eGFR has been calculated using the CKD EPI equation. This calculation has not been validated in all clinical situations. eGFR's persistently <90 mL/min signify possible Chronic Kidney Disease.    Anion gap 8 5 - 15  CBC     Status: Abnormal   Collection Time: 09/04/14  5:31 AM  Result Value Ref Range   WBC 16.3 (H) 4.0 - 10.5 K/uL   RBC 3.52 (L) 4.22 - 5.81 MIL/uL   Hemoglobin 7.6 (L) 13.0 - 17.0 g/dL   HCT 23.4 (L) 39.0 - 52.0 %   MCV 66.5 (L) 78.0 - 100.0 fL   MCH 21.6 (L) 26.0 - 34.0 pg   MCHC 32.5 30.0 - 36.0 g/dL   RDW 14.2 11.5 - 15.5 %   Platelets 325 150 - 400 K/uL    Dg Chest 1 View  09/03/2014   CLINICAL DATA:  Dyspnea  EXAM: CHEST  1 VIEW  COMPARISON:  03/02/2014  FINDINGS: Atherosclerotic aortic arch. Upper normal heart size. The lungs appear clear. No pleural effusion identified.  IMPRESSION: 1. Tortuous thoracic aorta. Upper normal heart size. Otherwise, no significant abnormalities are observed.   Electronically Signed   By: Van Clines M.D.   On: 09/03/2014 15:03   Dg Tibia/fibula Right  09/03/2014   CLINICAL DATA:  Chronic pain for 8 years.  No recent injury  EXAM: RIGHT TIBIA AND FIBULA - 2 VIEW  COMPARISON:  None.  FINDINGS: Frontal and lateral views were obtained. There is postoperative change in the medial malleolar region as well as in the distal fibula with alignment in these areas anatomic. No acute fracture or dislocation. There is a small knee joint effusion. There is no abnormal periosteal reaction. There are no blastic or lytic bone lesions. No osteomyelitis appreciable. There are multiple foci of calcification in the vascular structures of the right lower extremity. There appears to be soft tissue edema.  IMPRESSION: No acute fracture or dislocation. No erosive change or bony destruction. Small joint effusion in the knee region. Soft tissue edema. Multiple foci of  arterial vascular calcification, most likely secondary to atherosclerotic change.   Electronically Signed   By: Lowella Grip III M.D.   On: 09/03/2014 10:07   US Arterial Seg Single  09/03/2014   CLINICAL DATA:  Chronic nonhealing right lower extremity wound. History of left below-knee amputation.  EXAM: NONINVASIVE PHYSIOLOGIC VASCULAR STUDY OF RIGHT LOWER EXTREMITY  TECHNIQUE: Evaluation of the right lower extremity was performed at rest.  COMPARISON:  06/16/2010  FINDINGS: Despite multiple attempts, there was inability to obtain a waveform or pressure at the level of the dorsalis pedis artery or posterior tibial artery. The ankle-brachial index therefore could not be calculated. No Doppler signal was obtained.  IMPRESSION: Lack of Doppler signal at the level of the right dorsalis pedis and posterior tibial arteries. Ankle-brachial index could not be calculated.   Electronically Signed   By: Aletta Edouard M.D.   On: 09/03/2014 14:54    ROS: See chart Blood pressure 112/55, pulse 103, temperature 101.7 F (38.7 C), temperature source Oral, resp. rate 20, height 6' (1.829 m), weight 104.373 kg (230  lb 1.6 oz), SpO2 92 %. Physical Exam: Pleasant white male in no acute distress. Extremity examination reveals a left below-knee amputation. The right lower extremity below the knee shows evidence of chronic gangrenous changes. Thick brawny skin is present with multiple eschars present. He cannot wiggle his toes. He does have a femoral pulse present. The chronic skin changes are just below the knee. No dorsalis pedis or posterior tibial pulses palpable.  Assessment/Plan: Impression: Peripheral vascular disease with chronic ischemia of the right lower extremity Plan: He is not a candidate for peripheral revascularization due to the chronicity of his ischemia and his wheelchair-bound status. Local wound care will be minimally effective. I have suggested the patient undergo a right above-the-knee  amputation. He would like to think about it. I will need to stop his Plavix and aspirin prior to surgery.  Marlyss Cissell A 09/04/2014, 8:02 AM

## 2014-09-04 NOTE — Consult Note (Signed)
WOC wound consult note  Pt just evaluated by surgery for LE wounds, see Dr. Lovell SheehanJenkins note.  Needs BKA.  Reason for Consult: Asked to evaluate the buttock wounds.  Pt is WC bound from home.  Pt with history of DM, PVD, HTN and CAD Wound type: Stage III pressure ulcers x 3 Pressure Ulcer POA: Yes Measurement: Sacrum has two areas each is 1cm x 1cm x 0.2cm  Left ischium 4cm x4cm x 0.2cm   Wound bed: each area is mostly clean but with some fibrin, no s/s of infection. Drainage (amount, consistency, odor) moderate, serosanguinous  Periwound: intact  Dressing procedure/placement/frequency: Silicone foam in place, will add calcium alginate to manage exudate.  Pressure redistribution with air mattress ordered for bed.  Ordered chair pressure redistribution pad for when patient up in the chair.    Discussed POC with patient and bedside nurse.  Re consult if needed, will not follow at this time. Thanks  Kalyan Barabas [employer] RN, CWOCN [phone])

## 2014-09-04 NOTE — Clinical Social Work Note (Signed)
CSW received referral for placement. Pt is scheduled for right AKA. CSW will follow up with pt for d/c planning when appropriate.  Derenda FennelKara Mykah [place], [place] [phone]

## 2014-09-05 DIAGNOSIS — M79604 Pain in right leg: Secondary | ICD-10-CM

## 2014-09-05 LAB — CBC
HEMATOCRIT: 24.2 % — AB (ref 39.0–52.0)
Hemoglobin: 7.8 g/dL — ABNORMAL LOW (ref 13.0–17.0)
MCH: 21.5 pg — ABNORMAL LOW (ref 26.0–34.0)
MCHC: 32.2 g/dL (ref 30.0–36.0)
MCV: 66.7 fL — AB (ref 78.0–100.0)
Platelets: 362 10*3/uL (ref 150–400)
RBC: 3.63 MIL/uL — AB (ref 4.22–5.81)
RDW: 14.2 % (ref 11.5–15.5)
WBC: 16.3 10*3/uL — AB (ref 4.0–10.5)

## 2014-09-05 LAB — BASIC METABOLIC PANEL
Anion gap: 8 (ref 5–15)
BUN: 42 mg/dL — ABNORMAL HIGH (ref 6–23)
CO2: 26 mmol/L (ref 19–32)
CREATININE: 2.26 mg/dL — AB (ref 0.50–1.35)
Calcium: 8.5 mg/dL (ref 8.4–10.5)
Chloride: 104 mmol/L (ref 96–112)
GFR calc Af Amer: 31 mL/min — ABNORMAL LOW (ref >90)
GFR, EST NON AFRICAN AMERICAN: 27 mL/min — AB (ref >90)
Glucose, Bld: 136 mg/dL — ABNORMAL HIGH (ref 70–99)
Potassium: 3.7 mmol/L (ref 3.5–5.1)
Sodium: 138 mmol/L (ref 135–145)

## 2014-09-05 LAB — GLUCOSE, CAPILLARY
Glucose-Capillary: 131 mg/dL — ABNORMAL HIGH (ref 70–99)
Glucose-Capillary: 170 mg/dL — ABNORMAL HIGH (ref 70–99)
Glucose-Capillary: 158 mg/dL — ABNORMAL HIGH (ref 70–99)
Glucose-Capillary: 134 mg/dL — ABNORMAL HIGH (ref 70–99)
Glucose-Capillary: 151 mg/dL — ABNORMAL HIGH (ref 70–99)

## 2014-09-05 LAB — ABO/RH: ABO/RH(D): O POS

## 2014-09-05 MED ORDER — SODIUM CHLORIDE 0.9 % IV SOLN: Freq: Once | INTRAVENOUS | Status: DC

## 2014-09-05 NOTE — Clinical Social Work Psychosocial (Signed)
Clinical Social Work Department BRIEF PSYCHOSOCIAL ASSESSMENT 09/05/2014  Patient:  Steven [name], Steven [name]     Account Number:  [id number]     Admit date:  09/03/2014  Clinical Social Worker:  Wyatt Haste  Date/Time:  09/05/2014 09:46 AM  Referred by:  CSW  Date Referred:  09/05/2014 Referred for  SNF Placement   Other Referral:   Interview type:  Patient Other interview type:    PSYCHOSOCIAL DATA Living Status:  ALONE Admitted from facility:   Level of care:   Primary support name:  Samantha Primary support relationship to patient:  CHILD, ADULT Degree of support available:   supportive, but out of town    CURRENT CONCERNS Current Concerns  Post-Acute Placement   Other Concerns:    [place] / [place] CSW met with pt at bedside. Pt alert and oriented and known to CSW from previous admissions. He reports he lives alone, but has good support from local friends who come by 3 times a day to help him. Pt feels his best support is his daughter, [name] who lives in [place], but checks in on pt regularly. Pt came to ED due to right leg wound with pain. He will have right AKA on Friday. CSW attempted to discuss how pt was feeling regarding surgery. He states, "I'm in a lot of pain, and I want it to be gone." CSW acknowledged this, but pt would not open up further. He has left BKA and indicates he knows somewhat what to expect after surgery. Pt was wheelchair bound and required assist from his friends with transfers.  CSW asked pt if he had thought about needing rehab after surgery. He indicates that he has been through this before and understands. Pt wants to talk to his daughter before making any decisions, but he was clear he did not want CSW to call her to discuss. SNF list provided. CSW will meet with pt again tomorrow to determine county pt is interested in.   Assessment/plan status:  Psychosocial Support/Ongoing Assessment of Needs Other assessment/ plan:    Information/referral to community resources:   SNF list    PATIENT'S/FAMILY'S RESPONSE TO PLAN OF CARE: Pt is processing a lot right now. CSW agreed to return tomorrow to discuss further.       Benay Pike, [place]

## 2014-09-05 NOTE — Plan of Care (Signed)
Problem: Phase I Progression Outcomes Goal: Pain controlled with appropriate interventions Outcome: Progressing Patient taking oral medications well.

## 2014-09-05 NOTE — Progress Notes (Signed)
Subjective: No significant right extremity pain.  Objective: Vital signs in last 24 hours: Temp:  [99.4 F (37.4 C)-100.2 F (37.9 C)] 99.4 F (37.4 C) (04/06 0539) Pulse Rate:  [80-90] 80 (04/06 0539) Resp:  [20] 20 (04/06 0539) BP: (128-152)/(63-66) 128/66 mmHg (04/06 0539) SpO2:  [95 %-96 %] 95 % (04/06 0539) Weight:  [103.9 kg (229 lb 0.9 oz)] 103.9 kg (229 lb 0.9 oz) (04/06 0539) Last BM Date: 09/03/14  Intake/Output from previous day: 04/05 0701 - 04/06 0700 In: 1040 [P.O.:840; IV Piggyback:200] Out: -  Intake/Output this shift:    General appearance: alert, cooperative and no distress  Lab Results:   Recent Labs  09/04/14 0531 09/05/14 0720  WBC 16.3* 16.3*  HGB 7.6* 7.8*  HCT 23.4* 24.2*  PLT 325 362   BMET  Recent Labs  09/04/14 0531 09/05/14 0720  NA 135 138  K 4.1 3.7  CL 104 104  CO2 23 26  GLUCOSE 174* 136*  BUN 53* 42*  CREATININE 2.59* 2.26*  CALCIUM 8.2* 8.5   PT/INR  Recent Labs  09/03/14 0758  LABPROT 17.1*  INR 1.38    Studies/Results: Dg Chest 1 View  09/03/2014   CLINICAL DATA:  Dyspnea  EXAM: CHEST  1 VIEW  COMPARISON:  03/02/2014  FINDINGS: Atherosclerotic aortic arch. Upper normal heart size. The lungs appear clear. No pleural effusion identified.  IMPRESSION: 1. Tortuous thoracic aorta. Upper normal heart size. Otherwise, no significant abnormalities are observed.   Electronically Signed   By: Gaylyn RongWalter  Liebkemann M.D.   On: 09/03/2014 15:03   Dg Tibia/fibula Right  09/03/2014   CLINICAL DATA:  Chronic pain for 8 years.  No recent injury  EXAM: RIGHT TIBIA AND FIBULA - 2 VIEW  COMPARISON:  None.  FINDINGS: Frontal and lateral views were obtained. There is postoperative change in the medial malleolar region as well as in the distal fibula with alignment in these areas anatomic. No acute fracture or dislocation. There is a small knee joint effusion. There is no abnormal periosteal reaction. There are no blastic or lytic bone  lesions. No osteomyelitis appreciable. There are multiple foci of calcification in the vascular structures of the right lower extremity. There appears to be soft tissue edema.  IMPRESSION: No acute fracture or dislocation. No erosive change or bony destruction. Small joint effusion in the knee region. Soft tissue edema. Multiple foci of arterial vascular calcification, most likely secondary to atherosclerotic change.   Electronically Signed   By: Bretta BangWilliam  Woodruff III M.D.   On: 09/03/2014 10:07   [place] Arterial Seg Single  09/03/2014   CLINICAL DATA:  Chronic nonhealing right lower extremity wound. History of left below-knee amputation.  EXAM: NONINVASIVE PHYSIOLOGIC VASCULAR STUDY OF RIGHT LOWER EXTREMITY  TECHNIQUE: Evaluation of the right lower extremity was performed at rest.  COMPARISON:  06/16/2010  FINDINGS: Despite multiple attempts, there was inability to obtain a waveform or pressure at the level of the dorsalis pedis artery or posterior tibial artery. The ankle-brachial index therefore could not be calculated. No Doppler signal was obtained.  IMPRESSION: Lack of Doppler signal at the level of the right dorsalis pedis and posterior tibial arteries. Ankle-brachial index could not be calculated.   Electronically Signed   By: Irish LackGlenn  Yamagata M.D.   On: 09/03/2014 14:54    Anti-infectives: Anti-infectives    Start     Dose/Rate Route Frequency Ordered Stop   09/03/14 1800  piperacillin-tazobactam (ZOSYN) IVPB 3.375 g     3.375 g  12.5 mL/hr over 240 Minutes Intravenous Every 8 hours 09/03/14 1227     09/03/14 1300  vancomycin (VANCOCIN) 1,500 mg in sodium chloride 0.9 % 500 mL IVPB     1,500 mg 250 mL/hr over 120 Minutes Intravenous Every 48 hours 09/03/14 1227     09/03/14 0900  piperacillin-tazobactam (ZOSYN) IVPB 3.375 g     3.375 g 100 mL/hr over 30 Minutes Intravenous  Once 09/03/14 0856 09/03/14 1009   09/03/14 0900  vancomycin (VANCOCIN) IVPB 1000 mg/200 mL premix     1,000 mg 200 mL/hr  over 60 Minutes Intravenous  Once 09/03/14 0856 09/03/14 1049      Assessment/Plan: Impression: Chronic ischemia of right lower extremity. Plan: Have temporally scheduled patient for right above-the-knee amputation on 09/07/2014. Will transfuse 2 units of blood today. Will reassess in a.m. Patient is agreeable to undergoing surgery.  LOS: 2 days    Kweli Grassel A 09/05/2014

## 2014-09-05 NOTE — Progress Notes (Signed)
Helped patient with his Advance Directives. Will need to complete with witnesses and notary tomorrow. Offered emotional support as he discussed his decision about amputation.

## 2014-09-05 NOTE — Progress Notes (Signed)
[facility] PROGRESS NOTE  Steven [name] ZOX:[id number] DOB: 02/11/1941 DOA: 09/03/2014 PCP: Milana Obey, MD  Assessment/Plan: Right lower extremity nonhealing ischemic wound in setting of cellulitis, lymphedema and diabetes. Max temp 100.2.  Remains hemodynamically stable. Non-toxic appearing. Leukocytosis stable at 16. Continue vancomycin and Zosyn day #3. Arterial ultrasound dorsalis pedis and posterior tibial arterial blood flow could not be detected. Discussed with vascular surgery. Patient felt not to be candidate for revascularization. Evaluated by general surgical who opine AKA required. Patient agreed and Plavix and aspirin stopped per general surgery in anticipation of surgery on 09/07/14. Wound culture with abundant gram positive cocci in pairs moderate gram negative rods and rare gram positive rods Active Problems: Essential hypertension: controlled. Home meds include Norvasc, Cardura, Lasix, Apresoline, metoprolol and all on hold. HR hight end of normal. Continue to monitor closely. Consider resuming metoprolol at lower dose.   Cellulitis; right lower extremity with underlying erythema tenderness and heat. Antibiotics as noted above.   Hyponatremia: resolved. monitr   Diabetes; Hemoglobin A1c 7.1. CBG range 170. Continue sliding scale insulin for optimal control. Hold metformin   Anemia: Microcytic. Hg trending down below baseline and what it was on admission.transfuse 2 units PRBC's today.  No obvious sign of bleeding. Will likely need transfusion prior to surgery. Will continue iron supplement. Check FOBT   KIDNEY DISEASE, CHRONIC, STAGE III: Appears stable at baseline. Will monitor intake and output.  CVA 2001. Holding Plavix. Discontinued per general surgery in anticipation of surgery  Peripheral vascular disease: chronic. See #1. 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 Stable.    Code Status: DNR Family Communication: none presenet Disposition Plan: snf   Consultants:  General surgery  Procedures:  none  Antibiotics:  Vancomycin 09/03/14>>  Zosyn 09/03/14>>   HPI/Subjective: Awake reports controlled pain.   Objective: Filed Vitals:   09/05/14 1016  BP: 120/64  Pulse: 96  Temp: 98.9 F (37.2 C)  Resp: 20    Intake/Output Summary (Last 24 hours) at 09/05/14 1129 Last data filed at 09/05/14 0900  Gross per 24 hour  Intake   1130 ml  Output      0 ml  Net   1130 ml   Filed Weights   09/03/14 0749 09/04/14 0225 09/05/14 0539  Weight: 113.399 kg (250 lb) 104.373 kg (230 lb 1.6 oz) 103.9 kg (229 lb 0.9 oz)    Exam:   General:  Appears comfortable  Cardiovascular: A1 and S2 no MGR left BKA well healed, right leg tender/swollen with non-healing wound  Respiratory: normal effort BS clear bilaterally no wheeze  Abdomen: obese soft +BS  Musculoskeletal:  Right leg with thick dry cracked hyperkeratotic skin form knee to foot. Lymphedema, tenderness. Right heal with dangling skin, malodorous wound lateral aspect for top of ankle to mid shin.  Data Reviewed: Basic Metabolic Panel:  Recent Labs Lab 09/03/14 0815 09/04/14 0531 09/05/14 0720  NA 132* 135 138  K 4.6 4.1 3.7  CL 100 104 104  CO2 GLUCOSE 170* 174* 136*  BUN 55* 53* 42*  CREATININE 2.46* 2.59* 2.26*  CALCIUM 8.6 8.2* 8.5   Liver Function Tests:  Recent Labs Lab 09/03/14 0815  AST 13  ALT 9  ALKPHOS 62  BILITOT 0.4  PROT 8.0  ALBUMIN 2.0*   No results for input(s): LIPASE, AMYLASE in the last 168  hours. No results for input(s): AMMONIA in the last 168 hours. CBC:  Recent Labs Lab 09/03/14 0815 09/04/14 0531 09/05/14 0720  WBC 18.3* 16.3* 16.3*  NEUTROABS 14.8*  --   --   HGB 8.5* 7.6* 7.8*  HCT 26.5* 23.4* 24.2*  MCV 66.9* 66.5* 66.7*  PLT 382 325 362   Cardiac Enzymes: No results for  input(s): CKTOTAL, CKMB, CKMBINDEX, TROPONINI in the last 168 hours. BNP (last 3 results) No results for input(s): BNP in the last 8760 hours.  ProBNP (last 3 results) No results for input(s): PROBNP in the last 8760 hours.  CBG:  Recent Labs Lab 09/04/14 0809 09/04/14 1141 09/04/14 1713 09/04/14 2008 09/05/14 0735  GLUCAP 136* 194* 131* 143* 170*    Recent Results (from the past 240 hour(s))  Wound culture     Status: None (Preliminary result)   Collection Time: 09/03/14  9:30 AM  Result Value Ref Range Status   Specimen Description WOUND RIGHT LEG  Final   Special Requests NONE  Final   Gram Stain   Final    NO WBC SEEN RARE SQUAMOUS EPITHELIAL CELLS PRESENT ABUNDANT GRAM POSITIVE COCCI IN PAIRS MODERATE GRAM NEGATIVE RODS RARE GRAM POSITIVE RODS    Culture   Final    Culture reincubated for better growth Performed at [facility]    Report Status PENDING  Incomplete     Studies: Dg Chest 1 View  09/03/2014   CLINICAL DATA:  Dyspnea  EXAM: CHEST  1 VIEW  COMPARISON:  03/02/2014  FINDINGS: Atherosclerotic aortic arch. Upper normal heart size. The lungs appear clear. No pleural effusion identified.  IMPRESSION: 1. Tortuous thoracic aorta. Upper normal heart size. Otherwise, no significant abnormalities are observed.   Electronically Signed   By: Gaylyn RongWalter  Liebkemann M.D.   On: 09/03/2014 15:03   [place] Arterial Seg Single  09/03/2014   CLINICAL DATA:  Chronic nonhealing right lower extremity wound. History of left below-knee amputation.  EXAM: NONINVASIVE PHYSIOLOGIC VASCULAR STUDY OF RIGHT LOWER EXTREMITY  TECHNIQUE: Evaluation of the right lower extremity was performed at rest.  COMPARISON:  06/16/2010  FINDINGS: Despite multiple attempts, there was inability to obtain a waveform or pressure at the level of the dorsalis pedis artery or posterior tibial artery. The ankle-brachial index therefore could not be calculated. No Doppler signal was obtained.  IMPRESSION: Lack of  Doppler signal at the level of the right dorsalis pedis and posterior tibial arteries. Ankle-brachial index could not be calculated.   Electronically Signed   By: Irish LackGlenn  Yamagata M.D.   On: 09/03/2014 14:54    Scheduled Meds: . sodium chloride   Intravenous Once  . atorvastatin  20 mg Oral Daily  . docusate sodium  100 mg Oral BID  . ferrous sulfate  325 mg Oral Daily  . furosemide  40 mg Oral Daily  . insulin aspart  0-15 Units Subcutaneous TID WC  . insulin aspart  0-5 Units Subcutaneous QHS  . piperacillin-tazobactam (ZOSYN)  IV  3.375 g Intravenous Q8H  . 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 If 7PM-7AM, please contact night-coverage at [email or web address], password [facility] 09/05/2014, 11:29 AM  LOS: 2 days

## 2014-09-05 NOTE — Clinical Social Work Placement (Signed)
Clinical Social Work Department CLINICAL SOCIAL WORK PLACEMENT NOTE 09/05/2014  Patient:  Lars PinksMCKIEVER,Desiree  Account Number:  [id number] Admit date:  09/03/2014  Clinical Social Worker:  Derenda FennelKARA Rashaud Ybarbo, LCSW  Date/time:  09/05/2014 09:42 AM  Clinical Social Work is seeking post-discharge placement for this patient at the following level of care:   SKILLED NURSING   (*CSW will update this form in Epic as items are completed)   09/05/2014  Patient/family provided with [facility] Department of Clinical Social Work's list of facilities offering this level of care within the geographic area requested by the patient (or if unable, by the patient's family).  09/05/2014  Patient/family informed of their freedom to choose among providers that offer the needed level of care, that participate in Medicare, Medicaid or managed care program needed by the patient, have an available bed and are willing to accept the patient.  09/05/2014  Patient/family informed of [facility]' ownership interest in [facility], as well as of the fact that they are under no obligation to receive care at this facility.  PASARR submitted to EDS on  PASARR number received on   FL2 transmitted to all facilities in geographic area requested by pt/family on   FL2 transmitted to all facilities within larger geographic area on   Patient informed that his/her managed care company has contracts with or will negotiate with  certain facilities, including the following:     Patient/family informed of bed offers received:   Patient chooses bed at  Physician recommends and patient chooses bed at    Patient to be transferred to  on   Patient to be transferred to facility by  Patient and family notified of transfer on  Name of family member notified:    The following physician request were entered in Epic:   Additional Comments: Pt has existing pasarr.  Derenda FennelKara Sylena Lotter, [place] [phone]

## 2014-09-06 LAB — CBC
HEMATOCRIT: 29.9 % — AB (ref 39.0–52.0)
HEMOGLOBIN: 9.8 g/dL — AB (ref 13.0–17.0)
MCH: 22.8 pg — ABNORMAL LOW (ref 26.0–34.0)
MCHC: 32.8 g/dL (ref 30.0–36.0)
MCV: 69.5 fL — ABNORMAL LOW (ref 78.0–100.0)
Platelets: 338 10*3/uL (ref 150–400)
RBC: 4.3 MIL/uL (ref 4.22–5.81)
RDW: 16.1 % — ABNORMAL HIGH (ref 11.5–15.5)
WBC: 17.3 10*3/uL — ABNORMAL HIGH (ref 4.0–10.5)

## 2014-09-06 LAB — GLUCOSE, CAPILLARY
Glucose-Capillary: 117 mg/dL — ABNORMAL HIGH (ref 70–99)
Glucose-Capillary: 165 mg/dL — ABNORMAL HIGH (ref 70–99)
Glucose-Capillary: 126 mg/dL — ABNORMAL HIGH (ref 70–99)
Glucose-Capillary: 148 mg/dL — ABNORMAL HIGH (ref 70–99)

## 2014-09-06 LAB — BASIC METABOLIC PANEL
Anion gap: 8 (ref 5–15)
BUN: 36 mg/dL — AB (ref 6–23)
CHLORIDE: 100 mmol/L (ref 96–112)
CO2: 25 mmol/L (ref 19–32)
Calcium: 8.4 mg/dL (ref 8.4–10.5)
Creatinine, Ser: 2.13 mg/dL — ABNORMAL HIGH (ref 0.50–1.35)
GFR calc Af Amer: 33 mL/min — ABNORMAL LOW (ref >90)
GFR, EST NON AFRICAN AMERICAN: 29 mL/min — AB (ref >90)
Glucose, Bld: 138 mg/dL — ABNORMAL HIGH (ref 70–99)
Potassium: 3.9 mmol/L (ref 3.5–5.1)
Sodium: 133 mmol/L — ABNORMAL LOW (ref 135–145)

## 2014-09-06 LAB — SURGICAL PCR SCREEN
MRSA, PCR: NEGATIVE
Staphylococcus aureus: POSITIVE — AB

## 2014-09-06 LAB — PREPARE RBC (CROSSMATCH)

## 2014-09-06 MED ORDER — CHLORHEXIDINE GLUCONATE 4 % EX LIQD
1.0000 "application " | Freq: Once | CUTANEOUS | Status: AC
  Administered 2014-09-06: 1 via TOPICAL
  Filled 2014-09-06: qty 15

## 2014-09-06 MED ORDER — SODIUM CHLORIDE 0.9 % IV SOLN: INTRAVENOUS | Status: AC

## 2014-09-06 NOTE — Clinical Social Work Placement (Signed)
Clinical Social Work Department CLINICAL SOCIAL WORK PLACEMENT NOTE 09/06/2014  Patient:  Steven [name],Steven [name]  Account Number:  [id number] Admit date:  09/03/2014  Clinical Social Worker:  Derenda FennelKARA Navarro Nine, LCSW  Date/time:  09/05/2014 09:42 AM  Clinical Social Work is seeking post-discharge placement for this patient at the following level of care:   SKILLED NURSING   (*CSW will update this form in Epic as items are completed)   09/05/2014  Patient/family provided with [facility] Department of Clinical Social Work's list of facilities offering this level of care within the geographic area requested by the patient (or if unable, by the patient's family).  09/05/2014  Patient/family informed of their freedom to choose among providers that offer the needed level of care, that participate in Medicare, Medicaid or managed care program needed by the patient, have an available bed and are willing to accept the patient.  09/05/2014  Patient/family informed of [facility]' ownership interest in [facility], as well as of the fact that they are under no obligation to receive care at this facility.  PASARR submitted to EDS on  PASARR number received on   FL2 transmitted to all facilities in geographic area requested by pt/family on  09/06/2014 FL2 transmitted to all facilities within larger geographic area on   Patient informed that his/her managed care company has contracts with or will negotiate with  certain facilities, including the following:     Patient/family informed of bed offers received:   Patient chooses bed at  Physician recommends and patient chooses bed at    Patient to be transferred to  on   Patient to be transferred to facility by  Patient and family notified of transfer on  Name of family member notified:    The following physician request were entered in Epic:   Additional Comments: Pt has existing pasarr.  Derenda FennelKara Brodie Scovell, [place] [phone]

## 2014-09-06 NOTE — Progress Notes (Signed)
Patient's hematocrit responded appropriately with blood transfusions. Orders have been written for right above-the-knee amputation. The risks and benefits of the procedure including bleeding, infection, and the need for possible further blood transfusions were fully explained to the patient, who gave informed consent.

## 2014-09-06 NOTE — Progress Notes (Signed)
[facility] PROGRESS NOTE  Steven [name] ZOX:[id number] DOB: 1940/12/20 DOA: 09/03/2014 PCP: Milana Obey, MD  Assessment/Plan: Right lower extremity nonhealing ischemic wound in setting of cellulitis, lymphedema and diabetes. Max temp 99.2. Remains hemodynamically stable. Non-toxic appearing. Leukocytosis trending up slightly. Continue vancomycin and Zosyn day #4.  Wound culture with abundant gram positive cocci in pairs moderate gram negative rods and rare gram positive rods. AKA scheduled tomorrow Active Problems: Essential hypertension: controlled. Home meds include Norvasc, Cardura,  Apresoline, metoprolol and all on hold. Lasix resumed.     Cellulitis; right lower extremity with underlying erythema tenderness and heat. Antibiotics as noted above.   Hyponatremia: related to lasix. Very gentle IV. monitr   Diabetes; Hemoglobin A1c 7.1. CBG range 170. Continue sliding scale insulin for optimal control. Hold metformin   Anemia: Microcytic. S/P 2 units PRBC's today. No obvious sign of bleeding.  Will continue iron supplement. Check FOBT   KIDNEY DISEASE, CHRONIC, STAGE III: Appears stable at baseline. Urine output good  CVA 2001. Holding Plavix. Discontinued per general surgery in anticipation of surgery  Peripheral vascular disease: chronic. See #1. 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 Stable.    Code Status: DNR Family Communication: none present Disposition Plan: snf   Consultants:  General surgery  Procedures:  none  Antibiotics:  Vancomycin 09/03/14>>  Zosyn 09/03/14>>  HPI/Subjective: Sitting up eating. Reports good pain control  Objective: Filed Vitals:   09/06/14 0517  BP: 138/72  Pulse: 82  Temp: 98.2 F (36.8 C)  Resp: 20    Intake/Output Summary (Last 24 hours) at 09/06/14 0952 Last data filed at 09/06/14  0900  Gross per 24 hour  Intake   1210 ml  Output   1800 ml  Net   -590 ml   Filed Weights   09/04/14 0225 09/05/14 0539 09/06/14 0539  Weight: 104.373 kg (230 lb 1.6 oz) 103.9 kg (229 lb 0.9 oz) 102.6 kg (226 lb 3.1 oz)    Exam:   General:  Smiling appears comfortable  Cardiovascular: RRR no m/g/r Left BKA well healed, right leg malodorous, non-healing wound  Respiratory: normal effort BS clear bilaterally no rhonchi  Abdomen: non-distended +BS non-tender  Musculoskeletal:  Right leg with thick dry cracked hyperkeratotic skin form knee to foot. Lymphedema, tenderness. Right heal with dangling skin, malodorous wound lateral aspect for top of ankle to mid shin.  Data Reviewed: Basic Metabolic Panel:  Recent Labs Lab 09/03/14 0815 09/04/14 0531 09/05/14 0720 09/06/14 0600  NA 132* 135 138 133*  K 4.6 4.1 3.7 3.9  CL 100 104 104 100  CO2 GLUCOSE 170* 174* 136* 138*  BUN 55* 53* 42* 36*  CREATININE 2.46* 2.59* 2.26* 2.13*  CALCIUM 8.6 8.2* 8.5 8.4   Liver Function Tests:  Recent Labs Lab 09/03/14 0815  AST 13  ALT 9  ALKPHOS 62  BILITOT 0.4  PROT 8.0  ALBUMIN 2.0*   No results for input(s): LIPASE, AMYLASE in the last 168 hours. No results for input(s): AMMONIA in the last 168 hours. CBC:  Recent Labs Lab 09/03/14 0815 09/04/14 0531 09/05/14 0720 09/06/14 0600  WBC 18.3* 16.3* 16.3* 17.3*  NEUTROABS 14.8*  --   --   --   HGB 8.5* 7.6* 7.8* 9.8*  HCT 26.5* 23.4* 24.2* 29.9*  MCV 66.9* 66.5* 66.7* 69.5*  PLT 382 325  362 338   Cardiac Enzymes: No results for input(s): CKTOTAL, CKMB, CKMBINDEX, TROPONINI in the last 168 hours. BNP (last 3 results) No results for input(s): BNP in the last 8760 hours.  ProBNP (last 3 results) No results for input(s): PROBNP in the last 8760 hours.  CBG:  Recent Labs Lab 09/05/14 0735 09/05/14 1129 09/05/14 1618 09/05/14 2049 09/06/14 0748  GLUCAP 170* 158* 134* 151* 117*    Recent Results  (from the past 240 hour(s))  Wound culture     Status: None (Preliminary result)   Collection Time: 09/03/14  9:30 AM  Result Value Ref Range Status   Specimen Description WOUND RIGHT LEG  Final   Special Requests NONE  Final   Gram Stain   Final    NO WBC SEEN RARE SQUAMOUS EPITHELIAL CELLS PRESENT ABUNDANT GRAM POSITIVE COCCI IN PAIRS MODERATE GRAM NEGATIVE RODS RARE GRAM POSITIVE RODS    Culture   Final    MODERATE STAPHYLOCOCCUS SPECIES Performed at [facility]    Report Status PENDING  Incomplete     Studies: No results found.  Scheduled Meds: . sodium chloride   Intravenous Once  . atorvastatin  20 mg Oral Daily  . docusate sodium  100 mg Oral BID  . ferrous sulfate  325 mg Oral Daily  . furosemide  40 mg Oral Daily  . insulin aspart  0-15 Units Subcutaneous TID WC  . insulin aspart  0-5 Units Subcutaneous QHS  . piperacillin-tazobactam (ZOSYN)  IV  3.375 g Intravenous Q8H  . vancomycin  1,500 mg Intravenous Q48H   Continuous Infusions: . 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,KAREN M  [facility] Pager [phone]. If 7PM-7AM, please contact night-coverage at [email or web address], password [email or web address] 09/06/2014, 9:52 AM  LOS: 3 days

## 2014-09-06 NOTE — Clinical Social Work Note (Signed)
[place]. CSW sent referral to these facilities and will follow up with pt.  Derenda FennelKara Emmabelle Fear, [place] [phone]

## 2014-09-06 NOTE — Progress Notes (Signed)
CRITICAL VALUE ALERT  Critical value received:  PCR screening positive for SA  Date of notification:  09/06/2014  Time of notification:  1800  Critical value read back:Yes.    Nurse who received alert:  Doug SouMichel Castiel Lauricella  MD notified (1st page):  Dr. Ardyth HarpsHernandez  Time of first page:  1852  MD notified (2nd page):  Time of second page:  Responding MD:   Time MD responded:

## 2014-09-07 ENCOUNTER — Inpatient Hospital Stay (HOSPITAL_COMMUNITY): Payer: Medicare Other | Admitting: Anesthesiology

## 2014-09-07 ENCOUNTER — Encounter (HOSPITAL_COMMUNITY): Payer: Self-pay | Admitting: *Deleted

## 2014-09-07 ENCOUNTER — Encounter (HOSPITAL_COMMUNITY)
Admission: EM | Disposition: A | Discharge status: Skilled Nursing Facility | Payer: Self-pay | Source: Home / Self Care | Attending: Internal Medicine

## 2014-09-07 DIAGNOSIS — N183 Chronic kidney disease, stage 3 (moderate): Secondary | ICD-10-CM

## 2014-09-07 HISTORY — PX: AMPUTATION: SHX166

## 2014-09-07 LAB — WOUND CULTURE: GRAM STAIN: NONE SEEN

## 2014-09-07 LAB — CBC
HCT: 31.4 % — ABNORMAL LOW (ref 39.0–52.0)
HEMOGLOBIN: 10.3 g/dL — AB (ref 13.0–17.0)
MCH: 22.7 pg — ABNORMAL LOW (ref 26.0–34.0)
MCHC: 32.8 g/dL (ref 30.0–36.0)
MCV: 69.3 fL — ABNORMAL LOW (ref 78.0–100.0)
Platelets: 380 10*3/uL (ref 150–400)
RBC: 4.53 MIL/uL (ref 4.22–5.81)
RDW: 16.2 % — AB (ref 11.5–15.5)
WBC: 16.2 10*3/uL — ABNORMAL HIGH (ref 4.0–10.5)

## 2014-09-07 LAB — BASIC METABOLIC PANEL
Anion gap: 10 (ref 5–15)
BUN: 34 mg/dL — AB (ref 6–23)
CHLORIDE: 100 mmol/L (ref 96–112)
CO2: 26 mmol/L (ref 19–32)
Calcium: 8.7 mg/dL (ref 8.4–10.5)
Creatinine, Ser: 2.11 mg/dL — ABNORMAL HIGH (ref 0.50–1.35)
GFR calc Af Amer: 34 mL/min — ABNORMAL LOW (ref >90)
GFR, EST NON AFRICAN AMERICAN: 29 mL/min — AB (ref >90)
GLUCOSE: 137 mg/dL — AB (ref 70–99)
POTASSIUM: 3.8 mmol/L (ref 3.5–5.1)
SODIUM: 136 mmol/L (ref 135–145)

## 2014-09-07 LAB — GLUCOSE, CAPILLARY
GLUCOSE-CAPILLARY: 127 mg/dL — AB (ref 70–99)
Glucose-Capillary: 127 mg/dL — ABNORMAL HIGH (ref 70–99)
Glucose-Capillary: 117 mg/dL — ABNORMAL HIGH (ref 70–99)
Glucose-Capillary: 117 mg/dL — ABNORMAL HIGH (ref 70–99)
Glucose-Capillary: 126 mg/dL — ABNORMAL HIGH (ref 70–99)
Glucose-Capillary: 124 mg/dL — ABNORMAL HIGH (ref 70–99)

## 2014-09-07 LAB — PREPARE RBC (CROSSMATCH)

## 2014-09-07 SURGERY — AMPUTATION, ABOVE KNEE
Anesthesia: General | Laterality: Right

## 2014-09-07 MED ORDER — ONDANSETRON HCL 4 MG/2ML IJ SOLN
4.0000 mg | Freq: Once | INTRAMUSCULAR | Status: AC
  Administered 2014-09-07: 4 mg via INTRAVENOUS

## 2014-09-07 MED ORDER — ONDANSETRON HCL 4 MG/2ML IJ SOLN: 4.0000 mg | Freq: Once | INTRAMUSCULAR | Status: DC | PRN

## 2014-09-07 MED ORDER — MIDAZOLAM HCL 5 MG/5ML IJ SOLN
INTRAMUSCULAR | Status: DC | PRN
  Administered 2014-09-07: 2 mg via INTRAVENOUS

## 2014-09-07 MED ORDER — MIDAZOLAM HCL 2 MG/2ML IJ SOLN
INTRAMUSCULAR | Status: AC
  Filled 2014-09-07: qty 2

## 2014-09-07 MED ORDER — ONDANSETRON HCL 4 MG/2ML IJ SOLN
INTRAMUSCULAR | Status: AC
  Filled 2014-09-07: qty 2

## 2014-09-07 MED ORDER — CHLORHEXIDINE GLUCONATE CLOTH 2 % EX PADS
6.0000 | MEDICATED_PAD | Freq: Every day | CUTANEOUS | Status: AC
  Administered 2014-09-07 – 2014-09-11 (×5): 6 via TOPICAL

## 2014-09-07 MED ORDER — LACTATED RINGERS IV SOLN
INTRAVENOUS | Status: DC
  Administered 2014-09-07: 14:00:00 via INTRAVENOUS
  Administered 2014-09-08 – 2014-09-09 (×2): 50 mL/h via INTRAVENOUS
  Administered 2014-09-10 (×2): via INTRAVENOUS

## 2014-09-07 MED ORDER — FENTANYL CITRATE 0.05 MG/ML IJ SOLN
INTRAMUSCULAR | Status: AC
  Filled 2014-09-07: qty 5

## 2014-09-07 MED ORDER — GLYCOPYRROLATE 0.2 MG/ML IJ SOLN
INTRAMUSCULAR | Status: AC
  Filled 2014-09-07: qty 2

## 2014-09-07 MED ORDER — EPHEDRINE SULFATE 50 MG/ML IJ SOLN
INTRAMUSCULAR | Status: DC | PRN
  Administered 2014-09-07: 5 mg via INTRAVENOUS

## 2014-09-07 MED ORDER — SODIUM CHLORIDE 0.9 % IJ SOLN
INTRAMUSCULAR | Status: AC
  Filled 2014-09-07: qty 10

## 2014-09-07 MED ORDER — MUPIROCIN 2 % EX OINT
1.0000 "application " | TOPICAL_OINTMENT | Freq: Two times a day (BID) | CUTANEOUS | Status: DC
  Administered 2014-09-07 – 2014-09-11 (×9): 1 via NASAL
  Filled 2014-09-07: qty 22

## 2014-09-07 MED ORDER — SODIUM CHLORIDE 0.9 % IR SOLN
Status: DC | PRN
  Administered 2014-09-07: 1000 mL

## 2014-09-07 MED ORDER — MIDAZOLAM HCL 2 MG/2ML IJ SOLN
1.0000 mg | INTRAMUSCULAR | Status: DC | PRN
  Administered 2014-09-07 (×2): 2 mg via INTRAVENOUS

## 2014-09-07 MED ORDER — LACTATED RINGERS IV SOLN
INTRAVENOUS | Status: DC
  Administered 2014-09-07: 10:00:00 via INTRAVENOUS

## 2014-09-07 MED ORDER — GLYCOPYRROLATE 0.2 MG/ML IJ SOLN
INTRAMUSCULAR | Status: DC | PRN
  Administered 2014-09-07: 0.4 mg via INTRAVENOUS

## 2014-09-07 MED ORDER — PROPOFOL 10 MG/ML IV BOLUS
INTRAVENOUS | Status: AC
  Filled 2014-09-07: qty 20

## 2014-09-07 MED ORDER — PROPOFOL 10 MG/ML IV BOLUS
INTRAVENOUS | Status: DC | PRN
  Administered 2014-09-07: 130 mg via INTRAVENOUS

## 2014-09-07 MED ORDER — ROCURONIUM BROMIDE 100 MG/10ML IV SOLN
INTRAVENOUS | Status: DC | PRN
  Administered 2014-09-07: 40 mg via INTRAVENOUS
  Administered 2014-09-07: 10 mg via INTRAVENOUS

## 2014-09-07 MED ORDER — POVIDONE-IODINE 10 % OINT PACKET
TOPICAL_OINTMENT | CUTANEOUS | Status: DC | PRN
  Administered 2014-09-07: 1 via TOPICAL

## 2014-09-07 MED ORDER — FENTANYL CITRATE 0.05 MG/ML IJ SOLN
INTRAMUSCULAR | Status: DC | PRN
  Administered 2014-09-07 (×2): 50 ug via INTRAVENOUS
  Administered 2014-09-07: 75 ug via INTRAVENOUS
  Administered 2014-09-07: 25 ug via INTRAVENOUS

## 2014-09-07 MED ORDER — EPHEDRINE SULFATE 50 MG/ML IJ SOLN
INTRAMUSCULAR | Status: AC
  Filled 2014-09-07: qty 1

## 2014-09-07 MED ORDER — LIDOCAINE HCL 1 % IJ SOLN
INTRAMUSCULAR | Status: DC | PRN
  Administered 2014-09-07: 25 mg via INTRADERMAL

## 2014-09-07 MED ORDER — POVIDONE-IODINE 10 % EX OINT
TOPICAL_OINTMENT | CUTANEOUS | Status: AC
  Filled 2014-09-07: qty 2

## 2014-09-07 MED ORDER — FENTANYL CITRATE 0.05 MG/ML IJ SOLN
25.0000 ug | INTRAMUSCULAR | Status: DC | PRN
  Administered 2014-09-07 (×2): 50 ug via INTRAVENOUS
  Filled 2014-09-07: qty 2

## 2014-09-07 MED ORDER — NEOSTIGMINE METHYLSULFATE 10 MG/10ML IV SOLN
INTRAVENOUS | Status: DC | PRN
  Administered 2014-09-07: 1 mg via INTRAVENOUS
  Administered 2014-09-07: 2 mg via INTRAVENOUS

## 2014-09-07 MED ORDER — MUPIROCIN 2 % EX OINT
TOPICAL_OINTMENT | CUTANEOUS | Status: AC
  Filled 2014-09-07: qty 22

## 2014-09-07 MED ORDER — MORPHINE SULFATE 2 MG/ML IJ SOLN
2.0000 mg | INTRAMUSCULAR | Status: DC | PRN
  Administered 2014-09-07 – 2014-09-10 (×13): 2 mg via INTRAVENOUS
  Filled 2014-09-07 (×13): qty 1

## 2014-09-07 SURGICAL SUPPLY — 34 items
BAG HAMPER (MISCELLANEOUS) ×3 IMPLANT
BANDAGE ELASTIC 6 VELCRO NS (GAUZE/BANDAGES/DRESSINGS) ×4 IMPLANT
BLADE SAW RECIP 87.9 MT (BLADE) ×3 IMPLANT
BLADE SURG SZ20 CARB STEEL (BLADE) ×3 IMPLANT
BNDG COHESIVE 4X5 TAN STRL (GAUZE/BANDAGES/DRESSINGS) ×5 IMPLANT
BNDG GAUZE ELAST 4 BULKY (GAUZE/BANDAGES/DRESSINGS) ×6 IMPLANT
CLOTH BEACON ORANGE TIMEOUT ST (SAFETY) ×3 IMPLANT
COVER LIGHT HANDLE STERIS (MISCELLANEOUS) ×6 IMPLANT
ELECT REM PT RETURN 9FT ADLT (ELECTROSURGICAL) ×3
ELECTRODE REM PT RTRN 9FT ADLT (ELECTROSURGICAL) ×1 IMPLANT
GAUZE SPONGE 4X4 12PLY STRL (GAUZE/BANDAGES/DRESSINGS) ×6 IMPLANT
GLOVE BIOGEL PI IND STRL 7.0 (GLOVE) IMPLANT
GLOVE BIOGEL PI INDICATOR 7.0 (GLOVE) ×6
GLOVE ECLIPSE 6.5 STRL STRAW (GLOVE) ×2 IMPLANT
GLOVE ECLIPSE 7.0 STRL STRAW (GLOVE) ×2 IMPLANT
GLOVE SS N UNI LF 7.5 STRL (GLOVE) ×2 IMPLANT
GLOVE SURG SS PI 7.5 STRL IVOR (GLOVE) ×6 IMPLANT
GOWN STRL REUS W/TWL LRG LVL3 (GOWN DISPOSABLE) ×9 IMPLANT
INST SET MAJOR BONE (KITS) ×3 IMPLANT
KIT ROOM TURNOVER APOR (KITS) ×3 IMPLANT
MANIFOLD NEPTUNE II (INSTRUMENTS) ×3 IMPLANT
NS IRRIG 1000ML POUR BTL (IV SOLUTION) ×3 IMPLANT
PACK BASIC LIMB (CUSTOM PROCEDURE TRAY) ×3 IMPLANT
PAD ABD 5X9 TENDERSORB (GAUZE/BANDAGES/DRESSINGS) ×6 IMPLANT
PAD ARMBOARD 7.5X6 YLW CONV (MISCELLANEOUS) ×3 IMPLANT
SET BASIN LINEN APH (SET/KITS/TRAYS/PACK) ×3 IMPLANT
SOL PREP PROV IODINE SCRUB 4OZ (MISCELLANEOUS) ×3 IMPLANT
SPONGE LAP 18X18 X RAY DECT (DISPOSABLE) ×5 IMPLANT
STAPLER VISISTAT 35W (STAPLE) ×5 IMPLANT
SUT SILK 0 FSL (SUTURE) ×9 IMPLANT
SUT SILK 2 0 SH (SUTURE) ×2 IMPLANT
SUT VIC AB 2-0 CT1 27 (SUTURE) ×18
SUT VIC AB 2-0 CT1 TAPERPNT 27 (SUTURE) IMPLANT
YANKAUER SUCT BULB TIP NO VENT (SUCTIONS) ×3 IMPLANT

## 2014-09-07 NOTE — Progress Notes (Signed)
Patient lying in bed. Alert and oriented. Pain improved after pain medication per patient. CHG bath done for preop. Patient NPO. Patient ready to go down for surgery.

## 2014-09-07 NOTE — Clinical Social Work Note (Signed)
[facility] [place]. Facility notified. Pt states he wants to let his daughter know himself later today. He reports he is about to go to surgery. Brief support provided.   Derenda FennelKara Orli Degrave, [place] [phone]

## 2014-09-07 NOTE — Progress Notes (Signed)
Patients sacral and scrotal dressings due to be changed. Patient has just returned from surgery and stated he does not want that done at this time. Will pass on for nurse to address at a later time.

## 2014-09-07 NOTE — Op Note (Signed)
Patient:  Steven [name]  DOB:  1941/01/27  MRN:  [id number]   Preop Diagnosis:  Ischemia, right lower extremity  Postop Diagnosis:  Same  Procedure:  Right above-the-knee amputation  Surgeon:  Franky MachoMark Jomel Whittlesey, M.D.  Anes:  Gen. endotracheal  Indications:  Patient is a 74 year old black male status post left below-knee amputation remote past who presents with chronic right lower extremity ischemia and pain. The patient is about to undergo a right above-the-knee amputation. The risks and benefits of the procedure including bleeding, infection, wound breakdown, and the possibility of needing further blood transfusions were fully explained to the patient, who gave informed consent.  Procedure note:  The patient is placed the supine position. After induction of general endotracheal anesthesia, the right lower extremity was prepped and draped using the usual sterile technique with Betadine. Surgical site confirmation was performed. The right lower extremity below the knee was isolated due to cellulitis and chronic malodor. A fishmouth incision was made above the right knee. This was taken down to the femur. The femoral artery and vein were isolated and suture ligated using 0 silk sutures. The sciatic nerve was also isolated and tied off using a 2-0 Vicryl suture. Any small vessels were ligated using 2-0 Vicryl interrupted sutures. The femur was divided using the saw. Bone wax was applied to the distal femur to control bleeding. The posterior soft tissue was divided using the Bovie and the right lower extremity was removed from the operative field. Wound was irrigated normal saline. A figure-of-eight suture was placed in the soft tissue around the distal femur for coverage purposes. The anterior posterior fascial edges were reapproximated using 2-0 Vicryl interrupted sutures. Skin was closed using staples. Betadine ointment and dry sterile pressure dressing was applied.  All tape and needle counts were  correct at the end of the procedure. The patient was extubated in the operating room and transferred to PACU in stable condition.  Complications:  None  EBL:  100 mL  Specimen:  Right lower extremity

## 2014-09-07 NOTE — Care Management Utilization Note (Signed)
UR completed 

## 2014-09-07 NOTE — Anesthesia Postprocedure Evaluation (Signed)
  Anesthesia Post-op Note  Patient: Steven PinksJoseph Teale  Procedure(s) Performed: Procedure(s): RIGHT ABOVE THE KNEE AMPUTATION (Right)  Patient Location: PACU  Anesthesia Type:General  Level of Consciousness: awake and patient cooperative  Airway and Oxygen Therapy: Patient Spontanous Breathing and Patient connected to nasal cannula oxygen  Post-op Pain: 1 /10, mild  Post-op Assessment: Post-op Vital signs reviewed, Patient's Cardiovascular Status Stable, Respiratory Function Stable, Patent Airway and Pain level controlled  Post-op Vital Signs: Reviewed and stable  Last Vitals:  Filed Vitals:   09/07/14 1234  BP: 144/82  Pulse: 124  Temp: 37.3 C  Resp: 20    Complications: No apparent anesthesia complications

## 2014-09-07 NOTE — Transfer of Care (Signed)
Immediate Anesthesia Transfer of Care Note  Patient: Steven PinksJoseph [name]  Procedure(s) Performed: Procedure(s): RIGHT ABOVE THE KNEE AMPUTATION (Right)  Patient Location: PACU  Anesthesia Type:General  Level of Consciousness: awake and patient cooperative  Airway & Oxygen Therapy: Patient Spontanous Breathing and Patient connected to face mask oxygen  Post-op Assessment: Report given to RN, Post -op Vital signs reviewed and stable and Patient moving all extremities  Post vital signs: Reviewed and stable  Last Vitals:  Filed Vitals:   09/07/14 1030  BP: 129/74  Pulse:   Temp:   Resp: 16    Complications: No apparent anesthesia complications

## 2014-09-07 NOTE — Progress Notes (Signed)
Patient returned to floor from OR. Drowsy but arousable. O2 sat 89% on room air. O2 applied at 2 liters by [place]. Will continue to monitor.

## 2014-09-07 NOTE — Progress Notes (Signed)
[facility] PROGRESS NOTE  Steven [name] ZOX:[id number] DOB: [birth year] DOA: 09/03/2014 PCP: Milana Obey, MD  Assessment/Plan: Right lower extremity nonhealing ischemic wound in setting of cellulitis, lymphedema and diabetes. S/P AKA. Continue vancomycin and Zosyn day #5. Wound culture with abundant gram positive cocci in pairs moderate gram negative rods and rare gram positive rods. Await further cultures Active Problems: Essential hypertension: controlled. Home meds include Norvasc, Cardura, Apresoline, metoprolol and all on hold. Lasix resumed.   Cellulitis; right lower extremity with underlying erythema tenderness and heat. Antibiotics as noted above. Right AKA as above.    Hyponatremia: related to lasix.  Resolved. monitr   Diabetes; Hemoglobin A1c 7.1. CBG range 170. Continue sliding scale insulin for optimal control. Hold metformin   Anemia: Microcytic. S/P 2 units PRBC's 09/06/14 in anticipation of surgery. No obvious sign of bleeding. Will continue iron supplement. Check FOBT   KIDNEY DISEASE, CHRONIC, STAGE III: remains stable at baseline. Urine output good  CVA 2001. Holding Plavix. Discontinued per general surgery in anticipation of surgery  Peripheral vascular disease: chronic. See #1. 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 Stable.    Code Status: DNR Family Communication: none present Disposition Plan: snf   Consultants:  General surgery  Procedures:  Right AKA 09/07/14  Antibiotics:  Vancomycin 09/03/14>>  Zosyn 09/03/14>>  HPI/Subjective: Sleepy but easily aroused. Denied pain   Objective: Filed Vitals:   09/07/14 1330  BP: 125/62  Pulse: 109  Temp:   Resp: 16    Intake/Output Summary (Last 24 hours) at 09/07/14 1346 Last data filed at 09/07/14 1220  Gross per 24 hour  Intake   5680 ml  Output   2850 ml   Net   2830 ml   Filed Weights   09/06/14 0539 09/07/14 0504 09/07/14 0901  Weight: 102.6 kg (226 lb 3.1 oz) 102.059 kg (225 lb) 102.059 kg (225 lb)    Exam:   General:  Well nourished appears comfortable  Cardiovascular: tachycardic but regular no MGR . Left BKA well healed. Right AKA with dressing dry and intact  Respiratory: shallow non-labored BS distant but clear bilaterally no wheeze  Abdomen: obese soft +BS non-tender  Musculoskeletal: new right AKA with stump dressing dry and intact. Left BKA well healed   Data Reviewed: Basic Metabolic Panel:  Recent Labs Lab 09/03/14 0815 09/04/14 0531 09/05/14 0720 09/06/14 0600 09/07/14 0543  NA 132* 135 138 133* 136  K 4.6 4.1 3.7 3.9 3.8  CL 100 104 104 100 100  CO2 GLUCOSE 170* 174* 136* 138* 137*  BUN 55* 53* 42* 36* 34*  CREATININE 2.46* 2.59* 2.26* 2.13* 2.11*  CALCIUM 8.6 8.2* 8.5 8.4 8.7   Liver Function Tests:  Recent Labs Lab 09/03/14 0815  AST 13  ALT 9  ALKPHOS 62  BILITOT 0.4  PROT 8.0  ALBUMIN 2.0*   No results for input(s): LIPASE, AMYLASE in the last 168 hours. No results for input(s): AMMONIA in the last 168 hours. CBC:  Recent Labs Lab 09/03/14 0815 09/04/14 0531 09/05/14 0720 09/06/14 0600 09/07/14 0543  WBC 18.3* 16.3* 16.3* 17.3* 16.2*  NEUTROABS 14.8*  --   --   --   --   HGB 8.5* 7.6* 7.8* 9.8* 10.3*  HCT 26.5* 23.4* 24.2* 29.9* 31.4*  MCV 66.9* 66.5* 66.7* 69.5* 69.3*  PLT 382 325 362  338 380   Cardiac Enzymes: No results for input(s): CKTOTAL, CKMB, CKMBINDEX, TROPONINI in the last 168 hours. BNP (last 3 results) No results for input(s): BNP in the last 8760 hours.  ProBNP (last 3 results) No results for input(s): PROBNP in the last 8760 hours.  CBG:  Recent Labs Lab 09/06/14 1639 09/06/14 2052 09/07/14 0737 09/07/14 0906 09/07/14 1028  GLUCAP 126* 148* 127* 127* 117*    Recent Results (from the past 240 hour(s))  Wound culture     Status:  None   Collection Time: 09/03/14  9:30 AM  Result Value Ref Range Status   Specimen Description WOUND RIGHT LEG  Final   Special Requests NONE  Final   Gram Stain   Final    NO WBC SEEN RARE SQUAMOUS EPITHELIAL CELLS PRESENT ABUNDANT GRAM POSITIVE COCCI IN PAIRS MODERATE GRAM NEGATIVE RODS RARE GRAM POSITIVE RODS    Culture   Final    MODERATE STAPHYLOCOCCUS AUREUS Note: RIFAMPIN AND GENTAMICIN SHOULD NOT BE USED AS SINGLE DRUGS FOR TREATMENT OF STAPH INFECTIONS. Performed at [facility]    Report Status 09/07/2014 FINAL  Final   Organism ID, Bacteria STAPHYLOCOCCUS AUREUS  Final      Susceptibility   Staphylococcus aureus - MIC*    CLINDAMYCIN <=0.25 SENSITIVE Sensitive     ERYTHROMYCIN <=0.25 SENSITIVE Sensitive     GENTAMICIN <=0.5 SENSITIVE Sensitive     LEVOFLOXACIN 4 INTERMEDIATE Intermediate     OXACILLIN 0.5 SENSITIVE Sensitive     PENICILLIN 0.12 SENSITIVE Sensitive     RIFAMPIN <=0.5 SENSITIVE Sensitive     TRIMETH/SULFA <=10 SENSITIVE Sensitive     VANCOMYCIN 1 SENSITIVE Sensitive     TETRACYCLINE <=1 SENSITIVE Sensitive     MOXIFLOXACIN 1 INTERMEDIATE Intermediate     * MODERATE STAPHYLOCOCCUS AUREUS  Surgical pcr screen     Status: Abnormal   Collection Time: 09/06/14 11:00 AM  Result Value Ref Range Status   MRSA, PCR NEGATIVE NEGATIVE Final   Staphylococcus aureus POSITIVE (A) NEGATIVE Final    Comment:        The Xpert SA Assay (FDA approved for NASAL specimens in patients over 36 years of age), is one component of a comprehensive surveillance program.  Test performance has been validated by [facility] for patients greater than or equal to 53 year old. It is not intended to diagnose infection nor to guide or monitor treatment. RESULT CALLED TO, READ BACK BY AND VERIFIED WITH: FERGUSON,M. AT 1707 ON 09/06/2014 BY BAUGHAM,M.      Studies: No results found.  Scheduled Meds: . sodium chloride   Intravenous Once  . [MAR Hold]  atorvastatin  20 mg Oral Daily  . [MAR Hold] Chlorhexidine Gluconate Cloth  6 each Topical Daily  . [MAR Hold] docusate sodium  100 mg Oral BID  . [MAR Hold] ferrous sulfate  325 mg Oral Daily  . [MAR Hold] furosemide  40 mg Oral Daily  . [MAR Hold] insulin aspart  0-15 Units Subcutaneous TID WC  . [MAR Hold] insulin aspart  0-5 Units Subcutaneous QHS  . [MAR Hold] mupirocin ointment  1 application Nasal BID  . [MAR Hold] piperacillin-tazobactam (ZOSYN)  IV  3.375 g Intravenous Q8H  . [MAR Hold] vancomycin  1,500 mg Intravenous Q48H   Continuous Infusions: . 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 If 7PM-7AM, please contact night-coverage at [email or web address], password [email or web address] 09/07/2014, 1:46 PM  LOS: 4 days

## 2014-09-07 NOTE — Progress Notes (Signed)
Patient more alert and oriented at this time. VSS with low-grade temp (99.0); O2 saturations 95% on room air. Will continue to monitor.

## 2014-09-07 NOTE — Anesthesia Procedure Notes (Signed)
Procedure Name: Intubation Date/Time: 09/07/2014 11:03 AM Performed by: Despina HiddenIDACAVAGE, Lisett Dirusso J Pre-anesthesia Checklist: Patient being monitored, Suction available, Emergency Drugs available and Patient identified Patient Re-evaluated:Patient Re-evaluated prior to inductionOxygen Delivery Method: Circle system utilized Preoxygenation: Pre-oxygenation with 100% oxygen Intubation Type: IV induction Ventilation: Mask ventilation with difficulty, Two handed mask ventilation required and Oral airway inserted - appropriate to patient size Laryngoscope Size: Mac and 3 Grade View: Grade II Tube type: Oral Tube size: 8.0 mm Number of attempts: 1 Airway Equipment and Method: Stylet Placement Confirmation: ETT inserted through vocal cords under direct vision,  positive ETCO2 and breath sounds checked- equal and bilateral Secured at: 22 cm Tube secured with: Tape Dental Injury: Teeth and Oropharynx as per pre-operative assessment  Future Recommendations: Recommend- induction with short-acting agent, and alternative techniques readily available

## 2014-09-07 NOTE — Anesthesia Preprocedure Evaluation (Addendum)
Anesthesia Evaluation  Patient identified by MRN, date of birth, ID band Patient awake    Reviewed: Allergy & Precautions, NPO status , Patient's Chart, lab work & pertinent test results  Airway Mallampati: II  TM Distance: >3 FB     Dental  (+) Edentulous Upper, Edentulous Lower   Pulmonary shortness of breath, former smoker,  breath sounds clear to auscultation        Cardiovascular hypertension, Pt. on home beta blockers and Pt. on medications + CAD and + Peripheral Vascular Disease Rhythm:Regular Rate:Normal     Neuro/Psych  Neuromuscular disease    GI/Hepatic   Endo/Other  diabetes, Type 2, Insulin Dependent  Renal/GU CRFRenal disease     Musculoskeletal   Abdominal   Peds  Hematology   Anesthesia Other Findings   Reproductive/Obstetrics                            Anesthesia Physical Anesthesia Plan  ASA: IV  Anesthesia Plan: General   Post-op Pain Management:    Induction: Intravenous  Airway Management Planned: Oral ETT  Additional Equipment:   Intra-op Plan:   Post-operative Plan: Extubation in OR  Informed Consent: I have reviewed the patients History and Physical, chart, labs and discussed the procedure including the risks, benefits and alternatives for the proposed anesthesia with the patient or authorized representative who has indicated his/her understanding and acceptance.     Plan Discussed with:   Anesthesia Plan Comments:        Anesthesia Quick Evaluation

## 2014-09-07 NOTE — Clinical Social Work Placement (Signed)
Clinical Social Work Department CLINICAL SOCIAL WORK PLACEMENT NOTE 09/07/2014  Patient:  Steven [name],Steven [name]  Account Number:  [id number] Admit date:  09/03/2014  Clinical Social Worker:  Derenda FennelKARA Radiance Deady, LCSW  Date/time:  09/05/2014 09:42 AM  Clinical Social Work is seeking post-discharge placement for this patient at the following level of care:   SKILLED NURSING   (*CSW will update this form in Epic as items are completed)   09/05/2014  Patient[facility]family provided with [facility] Work's list of facilities offering this level of care within the geographic area requested by the patient (or if unable, by the patient's family).  09/05/2014  Patient/family informed of their freedom to choose among providers that offer the needed level of care, that participate in Medicare, Medicaid or managed care program needed by the patient, have an available bed and are willing to accept the patient.  09/05/2014  Patient/family informed of [facility]' ownership interest in [facility], as well as of the fact that they are under no obligation to receive care at this facility.  PASARR submitted to EDS on  PASARR number received on   FL2 transmitted to all facilities in geographic area requested by pt/family on  09/06/2014 FL2 transmitted to all facilities within larger geographic area on   Patient informed that his/her managed care company has contracts with or will negotiate with  certain facilities, including the following:     Patient/family informed of bed offers received:  09/07/2014 Patient chooses bed at Other Physician recommends and patient chooses bed at    Patient to be transferred to  on   Patient to be transferred to facility by  Patient and family notified of transfer on  Name of family member notified:    The following physician request were entered in Epic:   Additional Comments: Pt has existing pasarr. [facility]  [place] Corin Formisano,  [place] [phone]

## 2014-09-08 LAB — GLUCOSE, CAPILLARY
GLUCOSE-CAPILLARY: 130 mg/dL — AB (ref 70–99)
Glucose-Capillary: 103 mg/dL — ABNORMAL HIGH (ref 70–99)
Glucose-Capillary: 118 mg/dL — ABNORMAL HIGH (ref 70–99)
Glucose-Capillary: 119 mg/dL — ABNORMAL HIGH (ref 70–99)

## 2014-09-08 LAB — BASIC METABOLIC PANEL
Anion gap: 10 (ref 5–15)
BUN: 30 mg/dL — ABNORMAL HIGH (ref 6–23)
CO2: 26 mmol/L (ref 19–32)
Calcium: 8.6 mg/dL (ref 8.4–10.5)
Chloride: 101 mmol/L (ref 96–112)
Creatinine, Ser: 2.14 mg/dL — ABNORMAL HIGH (ref 0.50–1.35)
GFR calc Af Amer: 33 mL/min — ABNORMAL LOW (ref >90)
GFR calc non Af Amer: 29 mL/min — ABNORMAL LOW (ref >90)
GLUCOSE: 129 mg/dL — AB (ref 70–99)
POTASSIUM: 4.4 mmol/L (ref 3.5–5.1)
Sodium: 137 mmol/L (ref 135–145)

## 2014-09-08 LAB — CBC
HEMATOCRIT: 29.8 % — AB (ref 39.0–52.0)
HEMOGLOBIN: 9.5 g/dL — AB (ref 13.0–17.0)
MCH: 22.3 pg — AB (ref 26.0–34.0)
MCHC: 31.9 g/dL (ref 30.0–36.0)
MCV: 70 fL — ABNORMAL LOW (ref 78.0–100.0)
Platelets: 398 10*3/uL (ref 150–400)
RBC: 4.26 MIL/uL (ref 4.22–5.81)
RDW: 16.5 % — ABNORMAL HIGH (ref 11.5–15.5)
WBC: 15.6 10*3/uL — ABNORMAL HIGH (ref 4.0–10.5)

## 2014-09-08 LAB — PHOSPHORUS: PHOSPHORUS: 4.6 mg/dL (ref 2.3–4.6)

## 2014-09-08 LAB — MAGNESIUM: Magnesium: 1.7 mg/dL (ref 1.5–2.5)

## 2014-09-08 MED ORDER — VANCOMYCIN HCL 10 G IV SOLR
1500.0000 mg | INTRAVENOUS | Status: DC
  Filled 2014-09-08 (×2): qty 1500

## 2014-09-08 MED ORDER — CEFAZOLIN SODIUM 1-5 GM-% IV SOLN
1.0000 g | Freq: Three times a day (TID) | INTRAVENOUS | Status: DC
  Administered 2014-09-08 – 2014-09-10 (×6): 1 g via INTRAVENOUS
  Filled 2014-09-08 (×7): qty 50

## 2014-09-08 MED ORDER — PROMETHAZINE HCL 25 MG/ML IJ SOLN
12.5000 mg | Freq: Once | INTRAMUSCULAR | Status: AC
  Administered 2014-09-08: 12.5 mg via INTRAVENOUS
  Filled 2014-09-08: qty 1

## 2014-09-08 NOTE — Progress Notes (Addendum)
ANTIBIOTIC CONSULT NOTE  Pharmacy Consult for Ancef Indication: wound infection, s/p [name] AKA on 09/07/2014  Allergies  Allergen Reactions  . Amlodipine Besylate-Valsartan     REACTION: renal deterioration (Patient does not recall this allergy. This medication is BRAND NAME EXFORGE which includes NORVASC (Amlodipine) which patient takes daily. Patient is not aware of the reaction, the medication listed, or the allergy itself. Advised patient to verify this medication allergy and reaction with physician.    Patient Measurements: Height: 6' (182.9 cm) Weight: 222 lb 7.1 oz (100.9 kg) IBW/kg (Calculated) : 77.6  Vital Signs: Temp: 97.8 F (36.6 C) (04/09 0624) Temp Source: Oral (04/09 0624) BP: 94/65 mmHg (04/09 0624) Pulse Rate: 116 (04/09 0624) Intake/Output from previous day: 04/08 0701 - 04/09 0700 In: 700 [I.V.:700] Out: 850 [Urine:800; Blood:50] Intake/Output from this shift:    Labs:  Recent Labs  09/06/14 0600 09/07/14 0543 09/08/14 0706  WBC 17.3* 16.2* 15.6*  HGB 9.8* 10.3* 9.5*  PLT 338 380 398  CREATININE 2.13* 2.11* 2.14*   Estimated Creatinine Clearance: 37.2 mL/min (by C-G formula based on Cr of 2.14). No results for input(s): VANCOTROUGH, VANCOPEAK, VANCORANDOM, GENTTROUGH, GENTPEAK, GENTRANDOM, TOBRATROUGH, TOBRAPEAK, TOBRARND, AMIKACINPEAK, AMIKACINTROU, AMIKACIN in the last 72 hours.   Microbiology: Recent Results (from the past 720 hour(s))  Wound culture     Status: None   Collection Time: 09/03/14  9:30 AM  Result Value Ref Range Status   Specimen Description WOUND RIGHT LEG  Final   Special Requests NONE  Final   Gram Stain   Final    NO WBC SEEN RARE SQUAMOUS EPITHELIAL CELLS PRESENT ABUNDANT GRAM POSITIVE COCCI IN PAIRS MODERATE GRAM NEGATIVE RODS RARE GRAM POSITIVE RODS    Culture   Final    MODERATE STAPHYLOCOCCUS AUREUS Note: RIFAMPIN AND GENTAMICIN SHOULD NOT BE USED AS SINGLE DRUGS FOR TREATMENT OF STAPH INFECTIONS. Performed at  [facility]    Report Status 09/07/2014 FINAL  Final   Organism ID, Bacteria STAPHYLOCOCCUS AUREUS  Final      Susceptibility   Staphylococcus aureus - MIC*    CLINDAMYCIN <=0.25 SENSITIVE Sensitive     ERYTHROMYCIN <=0.25 SENSITIVE Sensitive     GENTAMICIN <=0.5 SENSITIVE Sensitive     LEVOFLOXACIN 4 INTERMEDIATE Intermediate     OXACILLIN 0.5 SENSITIVE Sensitive     PENICILLIN 0.12 SENSITIVE Sensitive     RIFAMPIN <=0.5 SENSITIVE Sensitive     TRIMETH/SULFA <=10 SENSITIVE Sensitive     VANCOMYCIN 1 SENSITIVE Sensitive     TETRACYCLINE <=1 SENSITIVE Sensitive     MOXIFLOXACIN 1 INTERMEDIATE Intermediate     * MODERATE STAPHYLOCOCCUS AUREUS  Surgical pcr screen     Status: Abnormal   Collection Time: 09/06/14 11:00 AM  Result Value Ref Range Status   MRSA, PCR NEGATIVE NEGATIVE Final   Staphylococcus aureus POSITIVE (A) NEGATIVE Final    Comment:        The Xpert SA Assay (FDA approved for NASAL specimens in patients over 4 years of age), is one component of a comprehensive surveillance program.  Test performance has been validated by [facility] for patients greater than or equal to 47 year old. It is not intended to diagnose infection nor to guide or monitor treatment. RESULT CALLED TO, READ BACK BY AND VERIFIED WITH: [name],M. AT 1707 ON 09/06/2014 BY BAUGHAM,M.     Anti-infectives    Start     Dose/Rate Route Frequency Ordered Stop   09/08/14 1300  vancomycin (VANCOCIN) 1,500  mg in sodium chloride 0.9 % 500 mL IVPB     1,500 mg 250 mL/hr over 120 Minutes Intravenous Every 48 hours 09/08/14 1201     09/03/14 1800  piperacillin-tazobactam (ZOSYN) IVPB 3.375 g     3.375 g 12.5 mL/hr over 240 Minutes Intravenous Every 8 hours 09/03/14 1227     09/03/14 1300  vancomycin (VANCOCIN) 1,500 mg in sodium chloride 0.9 % 500 mL IVPB  Status:  Discontinued     1,500 mg 250 mL/hr over 120 Minutes Intravenous Every 48 hours 09/03/14 1227 09/08/14 1201   09/03/14  0900  piperacillin-tazobactam (ZOSYN) IVPB 3.375 g     3.375 g 100 mL/hr over 30 Minutes Intravenous  Once 09/03/14 0856 09/03/14 1009   09/03/14 0900  vancomycin (VANCOCIN) IVPB 1000 mg/200 mL premix     1,000 mg 200 mL/hr over 60 Minutes Intravenous  Once 09/03/14 0856 09/03/14 1049      Assessment: 74 yo obese M with hx PVD s/p left BKA and chronic right leg wound.  Chronic kidney disease noted. NCrCl ~ 25-7430ml/min.  WBC stable, afebrile.  S/P [name] AKA on 09/07/2014.  Wound Cx = MSSA  Vancomycin 4/4>> Zosyn 4/4>>  Goal of Therapy:  Vancomycin trough level 15-20 mcg/ml  Plan:  Continue Zosyn 3.375gm IV Q8h to be infused over 4hrs Continue Vancomycin 1500mg  IV q48h.  Give today (omitted on 4/8 when at surgery). Check Vancomycin trough at steady state Monitor renal function and cx data.  Consider narrow therapy to Cefazolin for MSSA coverage.  Addendum: Begin Cefazolin protocol Cefazolin 1gm IV every 8 hours.  Steven [name], Steven [name] 09/08/2014,12:04 PM

## 2014-09-08 NOTE — Addendum Note (Signed)
Addendum  created 09/08/14 1457 by Earleen NewportAmy A Kaori Jumper, CRNA   Modules edited: Notes Section   Notes Section:  File: [id number]

## 2014-09-08 NOTE — Progress Notes (Signed)
[facility] PROGRESS NOTE  Steven [name] RUE:[id number] DOB: 1941/02/15 DOA: 09/03/2014 PCP: Milana ObeyKNOWLTON,STEPHEN D, MD  Assessment/Plan: Right lower extremity nonhealing ischemic wound in setting of cellulitis, lymphedema and diabetes -Status post above-knee amputation on 4/8. -Cultures have grown MSSA. -Vancomycin and Zosyn will be switched over to Ancef for 2 more days and will then discontinue.  Hypertension -Well-controlled, despite holding all of his oral meds.  Hyponatremia -Resolved.  Diabetes, type II -Well-controlled. -Continue current regimen.  Microcytic anemia -Was transfused 2 units of PRBCs for hemoglobin of 7.8 in anticipation of surgery. -Hemoglobin has remained stable status post transfusion. -No active signs of bleeding.  Chronic kidney disease stage III -Creatinine remains at his baseline.  Peripheral vascular disease -Status post left BKA and now right AKA.  Code Status: DNR Family Communication: Daughter at bedside updated on plan of care  Disposition Plan: To SNF on Monday   Consultants:  Surgery, Dr. Lovell SheehanJenkins   Antibiotics:  Ancef   Subjective: C/o phantom right leg pain and mild nausea.  Objective: Filed Vitals:   09/07/14 2107 09/08/14 0116 09/08/14 0624 09/08/14 1436  BP: 133/78 161/87 94/65 112/70  Pulse: 100 103 116 115  Temp: 98.5 F (36.9 C) 100.7 F (38.2 C) 97.8 F (36.6 C) 98.9 F (37.2 C)  TempSrc: Oral Oral Oral Oral  Resp: 18 18 18 18   Height:      Weight:   100.9 kg (222 lb 7.1 oz)   SpO2: 95% 92% 94% 94%   No intake or output data in the 24 hours ending 09/08/14 1448 Filed Weights   09/07/14 0504 09/07/14 0901 09/08/14 0624  Weight: 102.059 kg (225 lb) 102.059 kg (225 lb) 100.9 kg (222 lb 7.1 oz)    Exam:   General:  AA ox3  Cardiovascular: RRR  Respiratory: CTA B  Abdomen: S/NT/ND/+BS  Extremities: S/p left BKA and recent right AKA   Neurologic:  Non-focal.  Data Reviewed: Basic  Metabolic Panel:  Recent Labs Lab 09/04/14 0531 09/05/14 0720 09/06/14 0600 09/07/14 0543 09/08/14 0706  NA 135 138 133* 136 137  K 4.1 3.7 3.9 3.8 4.4  CL 104 104 100 100 101  CO2 23 26 25 26 26   GLUCOSE 174* 136* 138* 137* 129*  BUN 53* 42* 36* 34* 30*  CREATININE 2.59* 2.26* 2.13* 2.11* 2.14*  CALCIUM 8.2* 8.5 8.4 8.7 8.6  MG  --   --   --   --  1.7  PHOS  --   --   --   --  4.6   Liver Function Tests:  Recent Labs Lab 09/03/14 0815  AST 13  ALT 9  ALKPHOS 62  BILITOT 0.4  PROT 8.0  ALBUMIN 2.0*   No results for input(s): LIPASE, AMYLASE in the last 168 hours. No results for input(s): AMMONIA in the last 168 hours. CBC:  Recent Labs Lab 09/03/14 0815 09/04/14 0531 09/05/14 0720 09/06/14 0600 09/07/14 0543 09/08/14 0706  WBC 18.3* 16.3* 16.3* 17.3* 16.2* 15.6*  NEUTROABS 14.8*  --   --   --   --   --   HGB 8.5* 7.6* 7.8* 9.8* 10.3* 9.5*  HCT 26.5* 23.4* 24.2* 29.9* 31.4* 29.8*  MCV 66.9* 66.5* 66.7* 69.5* 69.3* 70.0*  PLT 382 325 362 338 380 398   Cardiac Enzymes: No results for input(s): CKTOTAL, CKMB, CKMBINDEX, TROPONINI in the last 168 hours. BNP (last 3 results) No results for input(s): BNP in the last 8760 hours.  ProBNP (last 3 results)  No results for input(s): PROBNP in the last 8760 hours.  CBG:  Recent Labs Lab 09/07/14 1237 09/07/14 1637 09/07/14 2118 09/08/14 0736 09/08/14 1151  GLUCAP 117* 126* 124* 130* 103*    Recent Results (from the past 240 hour(s))  Wound culture     Status: None   Collection Time: 09/03/14  9:30 AM  Result Value Ref Range Status   Specimen Description WOUND RIGHT LEG  Final   Special Requests NONE  Final   Gram Stain   Final    NO WBC SEEN RARE SQUAMOUS EPITHELIAL CELLS PRESENT ABUNDANT GRAM POSITIVE COCCI IN PAIRS MODERATE GRAM NEGATIVE RODS RARE GRAM POSITIVE RODS    Culture   Final    MODERATE STAPHYLOCOCCUS AUREUS Note: RIFAMPIN AND GENTAMICIN SHOULD NOT BE USED AS SINGLE DRUGS FOR  TREATMENT OF STAPH INFECTIONS. Performed at [facility]    Report Status 09/07/2014 FINAL  Final   Organism ID, Bacteria STAPHYLOCOCCUS AUREUS  Final      Susceptibility   Staphylococcus aureus - MIC*    CLINDAMYCIN <=0.25 SENSITIVE Sensitive     ERYTHROMYCIN <=0.25 SENSITIVE Sensitive     GENTAMICIN <=0.5 SENSITIVE Sensitive     LEVOFLOXACIN 4 INTERMEDIATE Intermediate     OXACILLIN 0.5 SENSITIVE Sensitive     PENICILLIN 0.12 SENSITIVE Sensitive     RIFAMPIN <=0.5 SENSITIVE Sensitive     TRIMETH/SULFA <=10 SENSITIVE Sensitive     VANCOMYCIN 1 SENSITIVE Sensitive     TETRACYCLINE <=1 SENSITIVE Sensitive     MOXIFLOXACIN 1 INTERMEDIATE Intermediate     * MODERATE STAPHYLOCOCCUS AUREUS  Surgical pcr screen     Status: Abnormal   Collection Time: 09/06/14 11:00 AM  Result Value Ref Range Status   MRSA, PCR NEGATIVE NEGATIVE Final   Staphylococcus aureus POSITIVE (A) NEGATIVE Final    Comment:        The Xpert SA Assay (FDA approved for NASAL specimens in patients over 80 years of age), is one component of a comprehensive surveillance program.  Test performance has been validated by [facility] for patients greater than or equal to 67 year old. It is not intended to diagnose infection nor to guide or monitor treatment. RESULT CALLED TO, READ BACK BY AND VERIFIED WITH: FERGUSON,M. AT 1707 ON 09/06/2014 BY BAUGHAM,M.      Studies: No results found.  Scheduled Meds: . sodium chloride   Intravenous Once  . atorvastatin  20 mg Oral Daily  .  ceFAZolin (ANCEF) IV  1 g Intravenous 3 times per day  . Chlorhexidine Gluconate Cloth  6 each Topical Daily  . docusate sodium  100 mg Oral BID  . ferrous sulfate  325 mg Oral Daily  . furosemide  40 mg Oral Daily  . insulin aspart  0-15 Units Subcutaneous TID WC  . insulin aspart  0-5 Units Subcutaneous QHS  . mupirocin ointment  1 application Nasal BID   Continuous Infusions: . 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 Greater than 50% of  this time was spent in direct contact with the patient coordinating care.    Chaya Jan  [facility] Pager [phone]  If 7PM-7AM, please contact night-coverage at [email or web address], password [facility] 09/08/2014, 2:48 PM  LOS: 5 days

## 2014-09-08 NOTE — Progress Notes (Signed)
Patient medicated for c/o pain and nausea.

## 2014-09-08 NOTE — Progress Notes (Signed)
1 Day Post-Op  Subjective: Dilaudid is controlling his pain postoperatively.  Objective: Vital signs in last 24 hours: Temp:  [97.2 F (36.2 C)-100.7 F (38.2 C)] 97.8 F (36.6 C) (04/09 0624) Pulse Rate:  [53-124] 116 (04/09 0624) Resp:  [16-20] 18 (04/09 0624) BP: (94-161)/(61-100) 94/65 mmHg (04/09 0624) SpO2:  [77 %-100 %] 94 % (04/09 0624) Weight:  [100.9 kg (222 lb 7.1 oz)] 100.9 kg (222 lb 7.1 oz) (04/09 0624) Last BM Date: 09/05/14  Intake/Output from previous day: 04/08 0701 - 04/09 0700 In: 700 [I.V.:700] Out: 850 [Urine:800; Blood:50] Intake/Output this shift:    General appearance: alert, cooperative and no distress Extremities: Right above-the-knee amputation site healing well. Difficult to keep pressure dressing in place.  Lab Results:   Recent Labs  09/07/14 0543 09/08/14 0706  WBC 16.2* 15.6*  HGB 10.3* 9.5*  HCT 31.4* 29.8*  PLT 380 398   BMET  Recent Labs  09/07/14 0543 09/08/14 0706  NA 136 137  K 3.8 4.4  CL 100 101  CO2 26 26  GLUCOSE 137* 129*  BUN 34* 30*  CREATININE 2.11* 2.14*  CALCIUM 8.7 8.6   PT/INR No results for input(s): LABPROT, INR in the last 72 hours.  Studies/Results: No results found.  Anti-infectives: Anti-infectives    Start     Dose/Rate Route Frequency Ordered Stop   09/03/14 1800  piperacillin-tazobactam (ZOSYN) IVPB 3.375 g     3.375 g 12.5 mL/hr over 240 Minutes Intravenous Every 8 hours 09/03/14 1227     09/03/14 1300  vancomycin (VANCOCIN) 1,500 mg in sodium chloride 0.9 % 500 mL IVPB     1,500 mg 250 mL/hr over 120 Minutes Intravenous Every 48 hours 09/03/14 1227     09/03/14 0900  piperacillin-tazobactam (ZOSYN) IVPB 3.375 g     3.375 g 100 mL/hr over 30 Minutes Intravenous  Once 09/03/14 0856 09/03/14 1009   09/03/14 0900  vancomycin (VANCOCIN) IVPB 1000 mg/200 mL premix     1,000 mg 200 mL/hr over 60 Minutes Intravenous  Once 09/03/14 0856 09/03/14 1049      Assessment/Plan: s/p  Procedure(s): RIGHT ABOVE THE KNEE AMPUTATION Impression: Stable on postoperative day 1. Hemoglobin stable. We'll continue to monitor.  LOS: 5 days    Evian Salguero A 09/08/2014

## 2014-09-08 NOTE — Progress Notes (Signed)
Patient given one time dose of phenergan. Zofran not effective earlier.

## 2014-09-08 NOTE — Anesthesia Postprocedure Evaluation (Addendum)
  Anesthesia Post-op Note  Patient: Steven [name]  Procedure(s) Performed: Procedure(s): RIGHT ABOVE THE KNEE AMPUTATION (Right)  Patient Location: Room 306  Anesthesia Type:General  Level of Consciousness: awake, alert , oriented and patient cooperative  Airway and Oxygen Therapy: Patient Spontanous Breathing  Post-op Pain: moderate  Post-op Assessment: Post-op Vital signs reviewed, Patient's Cardiovascular Status Stable, Respiratory Function Stable and Patent Airway  Post-op Vital Signs: Reviewed and stable  Last Vitals:  Filed Vitals:   09/08/14 0624  BP: 94/65  Pulse: 116  Temp: 36.6 C  Resp: 18    Complications: No apparent anesthesia complications; Discussed need to resume Lopressor with RN as he has not had it since admission to hospital; she is to notify MD.

## 2014-09-09 LAB — TYPE AND SCREEN
ABO/RH(D): O POS
ANTIBODY SCREEN: NEGATIVE
UNIT DIVISION: 0
Unit division: 0
Unit division: 0
Unit division: 0
Unit division: 0

## 2014-09-09 LAB — CBC
HCT: 30 % — ABNORMAL LOW (ref 39.0–52.0)
Hemoglobin: 9.5 g/dL — ABNORMAL LOW (ref 13.0–17.0)
MCH: 22.5 pg — AB (ref 26.0–34.0)
MCHC: 31.7 g/dL (ref 30.0–36.0)
MCV: 70.9 fL — AB (ref 78.0–100.0)
Platelets: 390 10*3/uL (ref 150–400)
RBC: 4.23 MIL/uL (ref 4.22–5.81)
RDW: 16.8 % — AB (ref 11.5–15.5)
WBC: 15.5 10*3/uL — AB (ref 4.0–10.5)

## 2014-09-09 LAB — GLUCOSE, CAPILLARY
GLUCOSE-CAPILLARY: 120 mg/dL — AB (ref 70–99)
Glucose-Capillary: 131 mg/dL — ABNORMAL HIGH (ref 70–99)
Glucose-Capillary: 100 mg/dL — ABNORMAL HIGH (ref 70–99)
Glucose-Capillary: 121 mg/dL — ABNORMAL HIGH (ref 70–99)

## 2014-09-09 LAB — BASIC METABOLIC PANEL
ANION GAP: 10 (ref 5–15)
BUN: 33 mg/dL — ABNORMAL HIGH (ref 6–23)
CALCIUM: 8.6 mg/dL (ref 8.4–10.5)
CHLORIDE: 101 mmol/L (ref 96–112)
CO2: 26 mmol/L (ref 19–32)
CREATININE: 2.36 mg/dL — AB (ref 0.50–1.35)
GFR calc non Af Amer: 26 mL/min — ABNORMAL LOW (ref >90)
GFR, EST AFRICAN AMERICAN: 30 mL/min — AB (ref >90)
GLUCOSE: 122 mg/dL — AB (ref 70–99)
Potassium: 4.2 mmol/L (ref 3.5–5.1)
SODIUM: 137 mmol/L (ref 135–145)

## 2014-09-09 NOTE — Progress Notes (Signed)
[facility] PROGRESS NOTE  Steven PinksJoseph [name] ZOX:[id number] DOB: 1940/06/08 DOA: 09/03/2014 PCP: Milana ObeyKNOWLTON,STEPHEN D, MD  Assessment/Plan: Right lower extremity nonhealing ischemic wound in setting of cellulitis, lymphedema and diabetes -Status post above-knee amputation on 4/8. -Cultures have grown MSSA. -Continue Ancef. -Has some minimal right stump pain and a low-grade temp of 100.3. -We'll continue to monitor.  Hypertension -Well-controlled, despite holding all of his oral meds. -We'll go ahead and resume his beta blocker.  Hyponatremia -Resolved.  Diabetes, type II -Well-controlled. -Continue current regimen.  Microcytic anemia -Was transfused 2 units of PRBCs for hemoglobin of 7.8 in anticipation of surgery. -Hemoglobin has remained stable status post transfusion. -No active signs of bleeding.  Chronic kidney disease stage III -Creatinine remains at his baseline, although slightly elevated from yesterday. -Continue to monitor.  Peripheral vascular disease -Status post left BKA and now right AKA.  Code Status: DNR Family Communication: Patient only Disposition Plan: To SNF on Monday   Consultants:  Surgery, Dr. Lovell SheehanJenkins   Antibiotics:  Ancef   Subjective: C/o phantom right leg pain and mild nausea, although somewhat improved from yesterday.  Objective: Filed Vitals:   09/08/14 1436 09/08/14 2120 09/09/14 0500 09/09/14 0617  BP: 112/70 138/81  134/79  Pulse: 115 102  113  Temp: 98.9 F (37.2 C) 98.7 F (37.1 C)  100.3 F (37.9 C)  TempSrc: Oral Oral  Oral  Resp: 18 18  18   Height:      Weight:   89.585 kg (197 lb 8 oz)   SpO2: 94% 98%  92%    Intake/Output Summary (Last 24 hours) at 09/09/14 1120 Last data filed at 09/09/14 [id number]  Gross per 24 hour  Intake      0 ml  Output   1100 ml  Net  -1100 ml   Filed Weights   09/07/14 0901 09/08/14 0624 09/09/14 0500  Weight: 102.059 kg (225 lb) 100.9 kg (222 lb 7.1 oz) 89.585 kg (197 lb 8  oz)    Exam:   General:  AA ox3  Cardiovascular: RRR  Respiratory: CTA B  Abdomen: S/NT/ND/+BS  Extremities: S/p left BKA and recent right AKA   Neurologic:  Non-focal.  Data Reviewed: Basic Metabolic Panel:  Recent Labs Lab 09/05/14 0720 09/06/14 0600 09/07/14 0543 09/08/14 0706 09/09/14 0630  NA 138 133* 136 137 137  K 3.7 3.9 3.8 4.4 4.2  CL 104 100 100 101 101  CO2 26 25 26 26 26   GLUCOSE 136* 138* 137* 129* 122*  BUN 42* 36* 34* 30* 33*  CREATININE 2.26* 2.13* 2.11* 2.14* 2.36*  CALCIUM 8.5 8.4 8.7 8.6 8.6  MG  --   --   --  1.7  --   PHOS  --   --   --  4.6  --    Liver Function Tests:  Recent Labs Lab 09/03/14 0815  AST 13  ALT 9  ALKPHOS 62  BILITOT 0.4  PROT 8.0  ALBUMIN 2.0*   No results for input(s): LIPASE, AMYLASE in the last 168 hours. No results for input(s): AMMONIA in the last 168 hours. CBC:  Recent Labs Lab 09/03/14 0815  09/05/14 0720 09/06/14 0600 09/07/14 0543 09/08/14 0706 09/09/14 0630  WBC 18.3*  < > 16.3* 17.3* 16.2* 15.6* 15.5*  NEUTROABS 14.8*  --   --   --   --   --   --   HGB 8.5*  < > 7.8* 9.8* 10.3* 9.5* 9.5*  HCT 26.5*  < >  24.2* 29.9* 31.4* 29.8* 30.0*  MCV 66.9*  < > 66.7* 69.5* 69.3* 70.0* 70.9*  PLT 382  < > 362 338 380 398 390  < > = values in this interval not displayed. Cardiac Enzymes: No results for input(s): CKTOTAL, CKMB, CKMBINDEX, TROPONINI in the last 168 hours. BNP (last 3 results) No results for input(s): BNP in the last 8760 hours.  ProBNP (last 3 results) No results for input(s): PROBNP in the last 8760 hours.  CBG:  Recent Labs Lab 09/08/14 0736 09/08/14 1151 09/08/14 1708 09/08/14 2123 09/09/14 0738  GLUCAP 130* 103* 118* 119* 120*    Recent Results (from the past 240 hour(s))  Wound culture     Status: None   Collection Time: 09/03/14  9:30 AM  Result Value Ref Range Status   Specimen Description WOUND RIGHT LEG  Final   Special Requests NONE  Final   Gram Stain    Final    NO WBC SEEN RARE SQUAMOUS EPITHELIAL CELLS PRESENT ABUNDANT GRAM POSITIVE COCCI IN PAIRS MODERATE GRAM NEGATIVE RODS RARE GRAM POSITIVE RODS    Culture   Final    MODERATE STAPHYLOCOCCUS AUREUS Note: RIFAMPIN AND GENTAMICIN SHOULD NOT BE USED AS SINGLE DRUGS FOR TREATMENT OF STAPH INFECTIONS. Performed at [facility]    Report Status 09/07/2014 FINAL  Final   Organism ID, Bacteria STAPHYLOCOCCUS AUREUS  Final      Susceptibility   Staphylococcus aureus - MIC*    CLINDAMYCIN <=0.25 SENSITIVE Sensitive     ERYTHROMYCIN <=0.25 SENSITIVE Sensitive     GENTAMICIN <=0.5 SENSITIVE Sensitive     LEVOFLOXACIN 4 INTERMEDIATE Intermediate     OXACILLIN 0.5 SENSITIVE Sensitive     PENICILLIN 0.12 SENSITIVE Sensitive     RIFAMPIN <=0.5 SENSITIVE Sensitive     TRIMETH/SULFA <=10 SENSITIVE Sensitive     VANCOMYCIN 1 SENSITIVE Sensitive     TETRACYCLINE <=1 SENSITIVE Sensitive     MOXIFLOXACIN 1 INTERMEDIATE Intermediate     * MODERATE STAPHYLOCOCCUS AUREUS  Surgical pcr screen     Status: Abnormal   Collection Time: 09/06/14 11:00 AM  Result Value Ref Range Status   MRSA, PCR NEGATIVE NEGATIVE Final   Staphylococcus aureus POSITIVE (A) NEGATIVE Final    Comment:        The Xpert SA Assay (FDA approved for NASAL specimens in patients over 45 years of age), is one component of a comprehensive surveillance program.  Test performance has been validated by [facility] for patients greater than or equal to 36 year old. It is not intended to diagnose infection nor to guide or monitor treatment. RESULT CALLED TO, READ BACK BY AND VERIFIED WITH: FERGUSON,M. AT 1707 ON 09/06/2014 BY BAUGHAM,M.      Studies: No results found.  Scheduled Meds: . sodium chloride   Intravenous Once  . atorvastatin  20 mg Oral Daily  .  ceFAZolin (ANCEF) IV  1 g Intravenous 3 times per day  . Chlorhexidine Gluconate Cloth  6 each Topical Daily  . docusate sodium  100 mg Oral BID  .  ferrous sulfate  325 mg Oral Daily  . furosemide  40 mg Oral Daily  . insulin aspart  0-15 Units Subcutaneous TID WC  . insulin aspart  0-5 Units Subcutaneous QHS  . mupirocin ointment  1 application Nasal BID   Continuous Infusions: . 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 Greater than 50% of this time was spent in direct contact with the patient coordinating care.    Chaya Jan  [facility] Pager [phone]  If 7PM-7AM, please contact night-coverage at [email or web address], password [facility] 09/09/2014, 11:20 AM  LOS: 6 days

## 2014-09-09 NOTE — Progress Notes (Signed)
Poor po intake at supper. Encourage patient eat.

## 2014-09-09 NOTE — Progress Notes (Signed)
2 Days Post-Op  Subjective: Some nausea this morning. Denies any significant right lower extremity pain.  Objective: Vital signs in last 24 hours: Temp:  [98.7 F (37.1 C)-100.3 F (37.9 C)] 100.3 F (37.9 C) (04/10 0617) Pulse Rate:  [102-115] 113 (04/10 0617) Resp:  [18] 18 (04/10 0617) BP: (112-138)/(70-81) 134/79 mmHg (04/10 0617) SpO2:  [92 %-98 %] 92 % (04/10 0617) Weight:  [89.585 kg (197 lb 8 oz)] 89.585 kg (197 lb 8 oz) (04/10 0500) Last BM Date: 09/07/14  Intake/Output from previous day: 04/09 0701 - 04/10 0700 In: -  Out: 1100 [Urine:1100] Intake/Output this shift:    General appearance: alert, cooperative and no distress Extremities: Right above-the-knee amputation site healing well.  Lab Results:   Recent Labs  09/08/14 0706 09/09/14 0630  WBC 15.6* 15.5*  HGB 9.5* 9.5*  HCT 29.8* 30.0*  PLT 398 390   BMET  Recent Labs  09/08/14 0706 09/09/14 0630  NA 137 137  K 4.4 4.2  CL 101 101  CO2 26 26  GLUCOSE 129* 122*  BUN 30* 33*  CREATININE 2.14* 2.36*  CALCIUM 8.6 8.6   PT/INR No results for input(s): LABPROT, INR in the last 72 hours.  Studies/Results: No results found.  Anti-infectives: Anti-infectives    Start     Dose/Rate Route Frequency Ordered Stop   09/08/14 1400  ceFAZolin (ANCEF) IVPB 1 g/50 mL premix     1 g 100 mL/hr over 30 Minutes Intravenous 3 times per day 09/08/14 1305     09/08/14 1300  vancomycin (VANCOCIN) 1,500 mg in sodium chloride 0.9 % 500 mL IVPB  Status:  Discontinued     1,500 mg 250 mL/hr over 120 Minutes Intravenous Every 48 hours 09/08/14 1201 09/08/14 1257   09/03/14 1800  piperacillin-tazobactam (ZOSYN) IVPB 3.375 g  Status:  Discontinued     3.375 g 12.5 mL/hr over 240 Minutes Intravenous Every 8 hours 09/03/14 1227 09/08/14 1257   09/03/14 1300  vancomycin (VANCOCIN) 1,500 mg in sodium chloride 0.9 % 500 mL IVPB  Status:  Discontinued     1,500 mg 250 mL/hr over 120 Minutes Intravenous Every 48 hours  09/03/14 1227 09/08/14 1201   09/03/14 0900  piperacillin-tazobactam (ZOSYN) IVPB 3.375 g     3.375 g 100 mL/hr over 30 Minutes Intravenous  Once 09/03/14 0856 09/03/14 1009   09/03/14 0900  vancomycin (VANCOCIN) IVPB 1000 mg/200 mL premix     1,000 mg 200 mL/hr over 60 Minutes Intravenous  Once 09/03/14 0856 09/03/14 1049      Assessment/Plan: s/p Procedure(s): RIGHT ABOVE THE KNEE AMPUTATION Impression: Stable on postoperative day 2. Hemoglobin stable.  Plan: Patient okay for discharge from surgery standpoint to skilled nursing unit. Staples should remain in incision for 2-3 weeks.  LOS: 6 days    Steven [name] 09/09/2014

## 2014-09-10 ENCOUNTER — Inpatient Hospital Stay (HOSPITAL_COMMUNITY): Payer: Medicare Other

## 2014-09-10 LAB — BASIC METABOLIC PANEL
ANION GAP: 9 (ref 5–15)
BUN: 37 mg/dL — AB (ref 6–23)
CALCIUM: 8.5 mg/dL (ref 8.4–10.5)
CO2: 27 mmol/L (ref 19–32)
CREATININE: 2.34 mg/dL — AB (ref 0.50–1.35)
Chloride: 103 mmol/L (ref 96–112)
GFR, EST AFRICAN AMERICAN: 30 mL/min — AB (ref >90)
GFR, EST NON AFRICAN AMERICAN: 26 mL/min — AB (ref >90)
Glucose, Bld: 115 mg/dL — ABNORMAL HIGH (ref 70–99)
Potassium: 3.9 mmol/L (ref 3.5–5.1)
Sodium: 139 mmol/L (ref 135–145)

## 2014-09-10 LAB — CBC
HEMATOCRIT: 29.2 % — AB (ref 39.0–52.0)
Hemoglobin: 9.2 g/dL — ABNORMAL LOW (ref 13.0–17.0)
MCH: 22.4 pg — ABNORMAL LOW (ref 26.0–34.0)
MCHC: 31.5 g/dL (ref 30.0–36.0)
MCV: 71.2 fL — AB (ref 78.0–100.0)
PLATELETS: 398 10*3/uL (ref 150–400)
RBC: 4.1 MIL/uL — ABNORMAL LOW (ref 4.22–5.81)
RDW: 16.8 % — AB (ref 11.5–15.5)
WBC: 14.6 10*3/uL — AB (ref 4.0–10.5)

## 2014-09-10 LAB — GLUCOSE, CAPILLARY
Glucose-Capillary: 119 mg/dL — ABNORMAL HIGH (ref 70–99)
Glucose-Capillary: 117 mg/dL — ABNORMAL HIGH (ref 70–99)
Glucose-Capillary: 149 mg/dL — ABNORMAL HIGH (ref 70–99)
Glucose-Capillary: 124 mg/dL — ABNORMAL HIGH (ref 70–99)

## 2014-09-10 MED ORDER — CEFAZOLIN SODIUM 1-5 GM-% IV SOLN
1.0000 g | Freq: Two times a day (BID) | INTRAVENOUS | Status: DC
  Administered 2014-09-10 – 2014-09-11 (×2): 1 g via INTRAVENOUS
  Filled 2014-09-10 (×4): qty 50

## 2014-09-10 MED ORDER — METOPROLOL TARTRATE 50 MG PO TABS
100.0000 mg | ORAL_TABLET | Freq: Two times a day (BID) | ORAL | Status: DC
  Administered 2014-09-10 – 2014-09-11 (×3): 100 mg via ORAL
  Filled 2014-09-10 (×3): qty 2

## 2014-09-10 NOTE — Progress Notes (Signed)
3 Days Post-Op  Subjective: Had fever spike last night. Patient states his nausea is improved. Denies any significant right lower extremity pain.  Objective: Vital signs in last 24 hours: Temp:  [98.7 F (37.1 C)-100.7 F (38.2 C)] 99.3 F (37.4 C) (04/11 0700) Pulse Rate:  [98-127] 98 (04/11 0700) Resp:  [18-20] 18 (04/11 0700) BP: (127-145)/(67-80) 145/67 mmHg (04/11 0700) SpO2:  [92 %-94 %] 94 % (04/11 0700) Weight:  [90.6 kg (199 lb 11.8 oz)-90.8 kg (200 lb 2.8 oz)] 90.6 kg (199 lb 11.8 oz) (04/11 0700) Last BM Date: 09/08/14  Intake/Output from previous day: 04/10 0701 - 04/11 0700 In: 926.7 [P.O.:480; I.V.:396.7; IV Piggyback:50] Out: 1425 [Urine:1425] Intake/Output this shift: Total I/O In: 240 [P.O.:240] Out: -   General appearance: alert, cooperative and no distress Extremities: Right above the amputation site healing well without erythema or purulent drainage.  Lab Results:   Recent Labs  09/09/14 0630 09/10/14 0544  WBC 15.5* 14.6*  HGB 9.5* 9.2*  HCT 30.0* 29.2*  PLT 390 398   BMET  Recent Labs  09/09/14 0630 09/10/14 0544  NA 137 139  K 4.2 3.9  CL 101 103  CO2 26 27  GLUCOSE 122* 115*  BUN 33* 37*  CREATININE 2.36* 2.34*  CALCIUM 8.6 8.5   PT/INR No results for input(s): LABPROT, INR in the last 72 hours.  Studies/Results: No results found.  Anti-infectives: Anti-infectives    Start     Dose/Rate Route Frequency Ordered Stop   09/10/14 2200  ceFAZolin (ANCEF) IVPB 1 g/50 mL premix     1 g 100 mL/hr over 30 Minutes Intravenous Every 12 hours 09/10/14 0848     09/08/14 1400  ceFAZolin (ANCEF) IVPB 1 g/50 mL premix  Status:  Discontinued     1 g 100 mL/hr over 30 Minutes Intravenous 3 times per day 09/08/14 1305 09/10/14 0848   09/08/14 1300  vancomycin (VANCOCIN) 1,500 mg in sodium chloride 0.9 % 500 mL IVPB  Status:  Discontinued     1,500 mg 250 mL/hr over 120 Minutes Intravenous Every 48 hours 09/08/14 1201 09/08/14 1257   09/03/14 1800  piperacillin-tazobactam (ZOSYN) IVPB 3.375 g  Status:  Discontinued     3.375 g 12.5 mL/hr over 240 Minutes Intravenous Every 8 hours 09/03/14 1227 09/08/14 1257   09/03/14 1300  vancomycin (VANCOCIN) 1,500 mg in sodium chloride 0.9 % 500 mL IVPB  Status:  Discontinued     1,500 mg 250 mL/hr over 120 Minutes Intravenous Every 48 hours 09/03/14 1227 09/08/14 1201   09/03/14 0900  piperacillin-tazobactam (ZOSYN) IVPB 3.375 g     3.375 g 100 mL/hr over 30 Minutes Intravenous  Once 09/03/14 0856 09/03/14 1009   09/03/14 0900  vancomycin (VANCOCIN) IVPB 1000 mg/200 mL premix     1,000 mg 200 mL/hr over 60 Minutes Intravenous  Once 09/03/14 0856 09/03/14 1049      Assessment/Plan: s/p Procedure(s): RIGHT ABOVE THE KNEE AMPUTATION Impression: No evidence of surgical site infection. Fever workup is pending.  LOS: 7 days    Steven [name] A 09/10/2014

## 2014-09-10 NOTE — Progress Notes (Signed)
IV placed in Left Antecubital. Pt tolerated IV insertion well. IV performed by Almira CoasterGina.

## 2014-09-10 NOTE — Progress Notes (Signed)
IV catheter removed by patient. IV catheter assessed and intact. IV catheter site clean dry and intact.

## 2014-09-10 NOTE — Progress Notes (Signed)
ANTIBIOTIC CONSULT NOTE  Pharmacy Consult for Ancef Indication: wound infection, s/p R AKA on 09/07/2014  Allergies  Allergen Reactions  . Amlodipine Besylate-Valsartan     REACTION: renal deterioration (Patient does not recall this allergy. This medication is BRAND NAME EXFORGE which includes NORVASC (Amlodipine) which patient takes daily. Patient is not aware of the reaction, the medication listed, or the allergy itself. Advised patient to verify this medication allergy and reaction with physician.   Patient Measurements: Height: 6' (182.9 cm) Weight: 199 lb 11.8 oz (90.6 kg) IBW/kg (Calculated) : 77.6  Vital Signs: Temp: 99.3 F (37.4 C) (04/11 0700) Temp Source: Oral (04/11 0700) BP: 145/67 mmHg (04/11 0700) Pulse Rate: 98 (04/11 0700) Intake/Output from previous day: 04/10 0701 - 04/11 0700 In: 926.7 [P.O.:480; I.V.:396.7; IV Piggyback:50] Out: 1425 [Urine:1425] Intake/Output from this shift:    Labs:  Recent Labs  09/08/14 0706 09/09/14 0630 09/10/14 0544  WBC 15.6* 15.5* 14.6*  HGB 9.5* 9.5* 9.2*  PLT 398 390 398  CREATININE 2.14* 2.36* 2.34*   Estimated Creatinine Clearance: 30.4 mL/min (by C-G formula based on Cr of 2.34). No results for input(s): VANCOTROUGH, VANCOPEAK, VANCORANDOM, GENTTROUGH, GENTPEAK, GENTRANDOM, TOBRATROUGH, TOBRAPEAK, TOBRARND, AMIKACINPEAK, AMIKACINTROU, AMIKACIN in the last 72 hours.   Microbiology: Recent Results (from the past 720 hour(s))  Wound culture     Status: None   Collection Time: 09/03/14  9:30 AM  Result Value Ref Range Status   Specimen Description WOUND RIGHT LEG  Final   Special Requests NONE  Final   Gram Stain   Final    NO WBC SEEN RARE SQUAMOUS EPITHELIAL CELLS PRESENT ABUNDANT GRAM POSITIVE COCCI IN PAIRS MODERATE GRAM NEGATIVE RODS RARE GRAM POSITIVE RODS    Culture   Final    MODERATE STAPHYLOCOCCUS AUREUS Note: RIFAMPIN AND GENTAMICIN SHOULD NOT BE USED AS SINGLE DRUGS FOR TREATMENT OF STAPH  INFECTIONS. Performed at [facility]    Report Status 09/07/2014 FINAL  Final   Organism ID, Bacteria STAPHYLOCOCCUS AUREUS  Final      Susceptibility   Staphylococcus aureus - MIC*    CLINDAMYCIN <=0.25 SENSITIVE Sensitive     ERYTHROMYCIN <=0.25 SENSITIVE Sensitive     GENTAMICIN <=0.5 SENSITIVE Sensitive     LEVOFLOXACIN 4 INTERMEDIATE Intermediate     OXACILLIN 0.5 SENSITIVE Sensitive     PENICILLIN 0.12 SENSITIVE Sensitive     RIFAMPIN <=0.5 SENSITIVE Sensitive     TRIMETH/SULFA <=10 SENSITIVE Sensitive     VANCOMYCIN 1 SENSITIVE Sensitive     TETRACYCLINE <=1 SENSITIVE Sensitive     MOXIFLOXACIN 1 INTERMEDIATE Intermediate     * MODERATE STAPHYLOCOCCUS AUREUS  Surgical pcr screen     Status: Abnormal   Collection Time: 09/06/14 11:00 AM  Result Value Ref Range Status   MRSA, PCR NEGATIVE NEGATIVE Final   Staphylococcus aureus POSITIVE (A) NEGATIVE Final    Comment:        The Xpert SA Assay (FDA approved for NASAL specimens in patients over 75 years of age), is one component of a comprehensive surveillance program.  Test performance has been validated by [facility] for patients greater than or equal to 75 year old. It is not intended to diagnose infection nor to guide or monitor treatment. RESULT CALLED TO, READ BACK BY AND VERIFIED WITH: FERGUSON,M. AT 1707 ON 09/06/2014 BY BAUGHAM,M.     Anti-infectives    Start     Dose/Rate Route Frequency Ordered Stop   09/08/14 1400  ceFAZolin (ANCEF)  IVPB 1 g/50 mL premix     1 g 100 mL/hr over 30 Minutes Intravenous 3 times per day 09/08/14 1305     09/08/14 1300  vancomycin (VANCOCIN) 1,500 mg in sodium chloride 0.9 % 500 mL IVPB  Status:  Discontinued     1,500 mg 250 mL/hr over 120 Minutes Intravenous Every 48 hours 09/08/14 1201 09/08/14 1257   09/03/14 1800  piperacillin-tazobactam (ZOSYN) IVPB 3.375 g  Status:  Discontinued     3.375 g 12.5 mL/hr over 240 Minutes Intravenous Every 8 hours 09/03/14 1227  09/08/14 1257   09/03/14 1300  vancomycin (VANCOCIN) 1,500 mg in sodium chloride 0.9 % 500 mL IVPB  Status:  Discontinued     1,500 mg 250 mL/hr over 120 Minutes Intravenous Every 48 hours 09/03/14 1227 09/08/14 1201   09/03/14 0900  piperacillin-tazobactam (ZOSYN) IVPB 3.375 g     3.375 g 100 mL/hr over 30 Minutes Intravenous  Once 09/03/14 0856 09/03/14 1009   09/03/14 0900  vancomycin (VANCOCIN) IVPB 1000 mg/200 mL premix     1,000 mg 200 mL/hr over 60 Minutes Intravenous  Once 09/03/14 0856 09/03/14 1049     Assessment: 74 yo obese M with hx PVD s/p left BKA and chronic right leg wound.  Chronic kidney disease noted. NCrCl ~ 25-75ml/min.  WBC stable, afebrile.  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  Wound Cx = MSSA  Vancomycin 4/4>> Zosyn 4/4>>  Goal of Therapy:  Vancomycin trough level 15-20 mcg/ml  Plan:  Cefazolin 1gm IV every 12 hours (renally adjusted for clcr < 35) Monitor labs, renal fxn, progress, and cultures  Valrie Hart A 09/10/2014,8:45 AM

## 2014-09-10 NOTE — Progress Notes (Signed)
IV catheter removed by patient. IV assessed and intact. IV site clean dry and intact.

## 2014-09-10 NOTE — Progress Notes (Signed)
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  -Cultures have grown MSSA. -Continue Ancef day #3. -continues with  minimal right stump pain. Max temp 100.7 last night with some tachycardia.  -Will repeat chest xray and blood cultures, obtain urinalysis  Hypertension -Controlled, despite holding all of his oral meds. -.will resume home BB at lower dose with parameters -monitor  Hyponatremia -Resolved.  Diabetes, type II -controlled. CBG range 115-119.  -Continue current regimen. -poor appetite due to nausea last 24 hours -monitor  Microcytic anemia -Was transfused 2 units of PRBCs for hemoglobin of 7.8 in anticipation of surgery. -Hemoglobin has remained stable status post transfusion. -No active signs of bleeding.  Chronic kidney disease stage III -Creatinine remains at his baseline, although slightly elevated from yesterday. -Continue to monitor.  Peripheral vascular disease -Status post left BKA and now right AKA. -appreciate Dr Lovell SheehanJenkins assistance   Code Status: DNR Family Communication: none present Disposition Plan: facility hopefully tomorrow   Consultants:  General surgery  Procedures:  Right AKA 09/07/14  Antibiotics:  Vancomycin 09/03/14>>09/08/14  Zosyn 09/03/14>>09/08/14  anceph 09/08/14>>  HPI/Subjective: Reports improved appetite as nausea better. Complains phantom pain.   Objective: Filed Vitals:   09/10/14 0700  BP: 145/67  Pulse: 98  Temp: 99.3 F (37.4 C)  Resp: 18    Intake/Output Summary (Last 24 hours) at 09/10/14 1018 Last data filed at 09/10/14 0900  Gross per 24 hour  Intake 926.67 ml  Output   1425 ml  Net -498.33 ml   Filed Weights   09/09/14 0500 09/10/14 0500 09/10/14 0700  Weight: 89.585  kg (197 lb 8 oz) 90.8 kg (200 lb 2.8 oz) 90.6 kg (199 lb 11.8 oz)    Exam:   General:  Obese appears comfortable  Cardiovascular: RRR no MGR  Respiratory: normal effort BS clear bilaterally  Abdomen: obese soft +BS non-tender to palpation  Musculoskeletal: s/p left BKA well healed and recent right AKA   Data Reviewed: Basic Metabolic Panel:  Recent Labs Lab 09/06/14 0600 09/07/14 0543 09/08/14 0706 09/09/14 0630 09/10/14 0544  NA 133* 136 137 137 139  K 3.9 3.8 4.4 4.2 3.9  CL 100 100 101 101 103  CO2 25 26 26 26 27   GLUCOSE 138* 137* 129* 122* 115*  BUN 36* 34* 30* 33* 37*  CREATININE 2.13* 2.11* 2.14* 2.36* 2.34*  CALCIUM 8.4 8.7 8.6 8.6 8.5  MG  --   --  1.7  --   --   PHOS  --   --  4.6  --   --    Liver Function Tests: No results for input(s): AST, ALT, ALKPHOS, BILITOT, PROT, ALBUMIN in the last 168 hours. No results for input(s): LIPASE, AMYLASE in the last 168 hours. No results for input(s): AMMONIA in the last 168 hours. CBC:  Recent Labs Lab 09/06/14 0600 09/07/14 0543 09/08/14 0706 09/09/14 0630 09/10/14 0544  WBC 17.3* 16.2* 15.6* 15.5* 14.6*  HGB 9.8* 10.3* 9.5* 9.5* 9.2*  HCT 29.9* 31.4* 29.8* 30.0* 29.2*  MCV 69.5* 69.3* 70.0* 70.9* 71.2*  PLT 338 380 398 390 398   Cardiac Enzymes: No results for input(s): CKTOTAL, CKMB, CKMBINDEX, TROPONINI in the last 168 hours. BNP (last 3 results) No results for input(s): BNP in the last 8760 hours.  ProBNP (last 3 results) No results for input(s):  PROBNP in the last 8760 hours.  CBG:  Recent Labs Lab 09/09/14 0738 09/09/14 1132 09/09/14 1707 09/09/14 2136 09/10/14 0753  GLUCAP 120* 131* 100* 121* 119*    Recent Results (from the past 240 hour(s))  Wound culture     Status: None   Collection Time: 09/03/14  9:30 AM  Result Value Ref Range Status   Specimen Description WOUND RIGHT LEG  Final   Special Requests NONE  Final   Gram Stain   Final    NO WBC SEEN RARE SQUAMOUS EPITHELIAL  CELLS PRESENT ABUNDANT GRAM POSITIVE COCCI IN PAIRS MODERATE GRAM NEGATIVE RODS RARE GRAM POSITIVE RODS    Culture   Final    MODERATE STAPHYLOCOCCUS AUREUS Note: RIFAMPIN AND GENTAMICIN SHOULD NOT BE USED AS SINGLE DRUGS FOR TREATMENT OF STAPH INFECTIONS. Performed at [facility]    Report Status 09/07/2014 FINAL  Final   Organism ID, Bacteria STAPHYLOCOCCUS AUREUS  Final      Susceptibility   Staphylococcus aureus - MIC*    CLINDAMYCIN <=0.25 SENSITIVE Sensitive     ERYTHROMYCIN <=0.25 SENSITIVE Sensitive     GENTAMICIN <=0.5 SENSITIVE Sensitive     LEVOFLOXACIN 4 INTERMEDIATE Intermediate     OXACILLIN 0.5 SENSITIVE Sensitive     PENICILLIN 0.12 SENSITIVE Sensitive     RIFAMPIN <=0.5 SENSITIVE Sensitive     TRIMETH/SULFA <=10 SENSITIVE Sensitive     VANCOMYCIN 1 SENSITIVE Sensitive     TETRACYCLINE <=1 SENSITIVE Sensitive     MOXIFLOXACIN 1 INTERMEDIATE Intermediate     * MODERATE STAPHYLOCOCCUS AUREUS  Surgical pcr screen     Status: Abnormal   Collection Time: 09/06/14 11:00 AM  Result Value Ref Range Status   MRSA, PCR NEGATIVE NEGATIVE Final   Staphylococcus aureus POSITIVE (A) NEGATIVE Final    Comment:        The Xpert SA Assay (FDA approved for NASAL specimens in patients over 64 years of age), is one component of a comprehensive surveillance program.  Test performance has been validated by [facility] for patients greater than or equal to 41 year old. It is not intended to diagnose infection nor to guide or monitor treatment. RESULT CALLED TO, READ BACK BY AND VERIFIED WITH: FERGUSON,M. AT 1707 ON 09/06/2014 BY BAUGHAM,M.      Studies: No results found.  Scheduled Meds: . sodium chloride   Intravenous Once  . atorvastatin  20 mg Oral Daily  .  ceFAZolin (ANCEF) IV  1 g Intravenous Q12H  . Chlorhexidine Gluconate Cloth  6 each Topical Daily  . docusate sodium  100 mg Oral BID  . ferrous sulfate  325 mg Oral Daily  . furosemide  40 mg Oral  Daily  . insulin aspart  0-15 Units Subcutaneous TID WC  . insulin aspart  0-5 Units Subcutaneous QHS  . mupirocin ointment  1 application Nasal BID   Continuous Infusions: . 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 If  7PM-7AM, please contact night-coverage at [email or web address], password [facility] 09/10/2014, 10:18 AM  LOS: 7 days

## 2014-09-10 NOTE — Clinical Social Work Note (Signed)
CSW updated [facility] on pt. Facility continues to be willing to accept pt when medically stable.   Derenda FennelKara Sheryl Towell, [place] [phone]

## 2014-09-10 NOTE — Care Management Utilization Note (Signed)
UR completed 

## 2014-09-11 ENCOUNTER — Encounter (HOSPITAL_COMMUNITY): Payer: Self-pay | Admitting: General Surgery

## 2014-09-11 LAB — GLUCOSE, CAPILLARY
GLUCOSE-CAPILLARY: 108 mg/dL — AB (ref 70–99)
Glucose-Capillary: 167 mg/dL — ABNORMAL HIGH (ref 70–99)

## 2014-09-11 LAB — BASIC METABOLIC PANEL
Anion gap: 8 (ref 5–15)
BUN: 40 mg/dL — ABNORMAL HIGH (ref 6–23)
CALCIUM: 8.4 mg/dL (ref 8.4–10.5)
CHLORIDE: 105 mmol/L (ref 96–112)
CO2: 27 mmol/L (ref 19–32)
Creatinine, Ser: 2.12 mg/dL — ABNORMAL HIGH (ref 0.50–1.35)
GFR calc Af Amer: 34 mL/min — ABNORMAL LOW (ref >90)
GFR calc non Af Amer: 29 mL/min — ABNORMAL LOW (ref >90)
Glucose, Bld: 117 mg/dL — ABNORMAL HIGH (ref 70–99)
Potassium: 3.8 mmol/L (ref 3.5–5.1)
Sodium: 140 mmol/L (ref 135–145)

## 2014-09-11 LAB — CBC
HCT: 27.3 % — ABNORMAL LOW (ref 39.0–52.0)
HEMOGLOBIN: 8.4 g/dL — AB (ref 13.0–17.0)
MCH: 22 pg — AB (ref 26.0–34.0)
MCHC: 30.8 g/dL (ref 30.0–36.0)
MCV: 71.5 fL — AB (ref 78.0–100.0)
Platelets: 430 10*3/uL — ABNORMAL HIGH (ref 150–400)
RBC: 3.82 MIL/uL — ABNORMAL LOW (ref 4.22–5.81)
RDW: 17 % — ABNORMAL HIGH (ref 11.5–15.5)
WBC: 11.7 10*3/uL — ABNORMAL HIGH (ref 4.0–10.5)

## 2014-09-11 MED ORDER — HYDROCODONE-ACETAMINOPHEN 5-325 MG PO TABS: 1.0000 | ORAL_TABLET | Freq: Four times a day (QID) | ORAL | Status: AC | PRN

## 2014-09-11 MED ORDER — DOCUSATE SODIUM 100 MG PO CAPS: 100.0000 mg | ORAL_CAPSULE | Freq: Two times a day (BID) | ORAL | Status: AC

## 2014-09-11 MED ORDER — METOPROLOL TARTRATE 100 MG PO TABS: 100.0000 mg | ORAL_TABLET | Freq: Two times a day (BID) | ORAL | Status: AC

## 2014-09-11 NOTE — Clinical Social Work Note (Signed)
[facility]. Pt and facility aware and agreeable. He states he has already notified his daughter and she is on her way. D/C summary faxed. Facility can remove staples. Notified admissions that pt has not had PT evaluation. They report this is okay and will complete today when pt arrives. CSW discussed transportation options of car, wheelchair Zenaida Niecevan, or EMS. Pt is hopeful to go by car, but will consider alternative if needed once daughter arrives.  Derenda FennelKara Ludwig [place], [place] [phone]

## 2014-09-11 NOTE — Discharge Summary (Signed)
Physician Discharge Summary  Steven [name] ZOX:[id number] DOB: July 16, 1940 DOA: 09/03/2014  PCP: Milana Obey, MD  Admit date: 09/03/2014 Discharge date: 09/11/2014  Time spent: 40 minutes  Recommendations for Outpatient Follow-up:  1. Follow up with Dr Lovell Sheehan 3-4 weeks for evaluation of surgical site. Remove staple 4/28  2. Discharge to [facility] 3. Recommend CBC 1 week to track hg. 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 male past medical history that includes hypertension, diabetes, CAD with a BKA on the left 2005, chronic right leg wound, morbid obesity, chronic kidney disease stage III, sickle cell trait, peripheral vascular disease, CVA in 2001 presented to  emergency department 09/03/14 with chief complaint of persistent and worsening right leg pain. Initial evaluation in the emergency department revealed leukocytosis of 18.3 hyponatremia mild 132 and right lower extremity with edema, erythema, necrotic tissue, malodorous drainage and extreme tenderness.  Stated  that he was in a rehabilitation center from October of last year until about 3 weeks prior when he was discharged to home. He is wheelchair bound. He stated he has chronic pain in his lower right leg but over the previous 3-4 days it had increased. He would take  his usual pain pill without relief. Pain located right lower extremity below the knee particularly lateral aspect at the site of his chronic wound.Pain constant and worsening. Associated symptoms included swelling redness decreased range of motion to the right lower extremity. He denied fever chills nausea vomiting diarrhea. He denied headache visual disturbances numbness or tingling of his extremities. Stated he called EMS when the pain became unbearable.  Workup in the emergency department included complete blood count significant for WBC 18.3 hemoglobin 8.5, complete metabolic panel significant for sodium 132 creatinine 2.46 serum glucose 170. X-ray of the right tibia/fibula negative for acute fracture or dislocation but showd small joint effusion at the knee, soft tissue edema, multiple foci of arterial vascular calcification. In the emergency department he was afebrile hemodynamically stable with a blood pressure at the low end of normal he was not hypoxic. He was provided with Zosyn and vancomycin.  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  -Cultures have grown MSSA. -received Vanc and Zosyn for 5 days and Ancef for 3 days.  -afebrile at discharge.  -blood cultures with no growth  Hypertension - fair control despite holding all of his oral meds. -.BB and lasix and norvasc resumed post op and will be continued at discharge. BB at lower dose  -BP range at discharge 107/58 -159/59 with HR range 65-71.  -recommend monitoring BP BID for 2 weeks to evaluate BP control  Hyponatremia -Resolved.  Diabetes, type II -controlled. CBG range 115-119.  -  Oral agents resumed at discharge.  -appetite improved.  -A1c 7.1  Microcytic anemia -Was transfused 2 units of PRBCs for hemoglobin of 7.8 in anticipation of surgery. -Hemoglobin has remained stable status post  transfusion. -No active signs of bleeding. -Hg range 8.5-9.5.  -recommend CBC in 1 week to track Hg  Chronic kidney disease stage III -Creatinine remained at his baseline   Peripheral vascular disease -Status post left BKA and now right AKA. -appreciate Dr Lovell Sheehan assistance   Procedures:  Right AKA 09/07/14    Consultations:  General surgery Dr Lovell Sheehan  Discharge Exam: Filed Vitals:   09/11/14 0538  BP: 159/59  Pulse: 65  Temp: 98.7 F (37.1 C)  Resp: 18    General: well nourished appears comfortable Cardiovascular: RRR no MGR Respiratory: normal effort BS clear bilaterally no wheeze  Discharge Instructions   Discharge Instructions    Diet - low sodium heart healthy    Complete by:  As directed      Discharge instructions    Complete by:  As directed   Take medications as directed Monitor BP BID for 2 weeks as anti-hypertensive meds adjusted Take staples out 09/27/14. If unable to remove at facility make appointment with Dr Lovell Sheehan office     Increase activity slowly    Complete by:  As directed           Current Discharge Medication List    START taking these medications   Details  docusate sodium (COLACE) 100 MG capsule Take 1 capsule (100 mg total) by mouth 2 (two) times daily. Qty: 10 capsule, Refills: 0      CONTINUE these medications which have CHANGED   Details  HYDROcodone-acetaminophen (NORCO/VICODIN) 5-325 MG per tablet Take 1-2 tablets by mouth every 6 (six) hours as needed (pain). Qty: 30 tablet, Refills: 0    metoprolol (LOPRESSOR) 100 MG tablet Take 1 tablet (100 mg total) by mouth 2 (two) times daily.      CONTINUE these medications which have NOT CHANGED   Details  amLODipine (NORVASC) 10 MG tablet Take 10 mg by mouth daily.    aspirin EC 81 MG tablet Take 81 mg by mouth daily.    atorvastatin (LIPITOR) 20 MG tablet Take 20 mg by mouth daily.    clopidogrel (PLAVIX) 75 MG tablet Take 75 mg by mouth daily.    ferrous sulfate  325 (65 FE) MG tablet Take 325 mg by mouth daily.    furosemide (LASIX) 40 MG tablet Take 40 mg by mouth daily.    metFORMIN (GLUCOPHAGE) 500 MG tablet Take 500 mg by mouth 2 (two) times daily with a meal.      STOP taking these medications     doxazosin (CARDURA) 4 MG tablet      hydrALAZINE (APRESOLINE) 100 MG tablet      collagenase (SANTYL) ointment        Allergies  Allergen Reactions  . Amlodipine Besylate-Valsartan     REACTION: renal deterioration (Patient does not recall this allergy. This medication is BRAND NAME EXFORGE which includes NORVASC (Amlodipine) which patient takes daily. Patient is not aware of the reaction, the medication listed, or the allergy itself. Advised patient to verify this medication allergy and reaction with physician.   Follow-up Information    Follow up with Dalia Heading, MD.   Specialty:  General Surgery   Why:  As needed.  Staples should be removed in 3 weeks.   Contact information:   [address] [place]  [phone]        The results of significant diagnostics from this hospitalization (including imaging, microbiology, ancillary and laboratory) are listed below for reference.    Significant Diagnostic Studies: Dg Chest 1 View  09/03/2014   CLINICAL DATA:  Dyspnea  EXAM: CHEST  1 VIEW  COMPARISON:  03/02/2014  FINDINGS: Atherosclerotic aortic arch. Upper normal heart size. The lungs appear clear. No pleural effusion identified.  IMPRESSION: 1. Tortuous thoracic aorta. Upper normal heart size. Otherwise, no significant abnormalities are observed.   Electronically Signed   By: Gaylyn RongWalter  Liebkemann M.D.   On: 09/03/2014 15:03   Dg Tibia/fibula Right  09/03/2014   CLINICAL DATA:  Chronic pain for 8 years.  No recent injury  EXAM: RIGHT TIBIA AND FIBULA - 2 VIEW  COMPARISON:  None.  FINDINGS: Frontal and lateral views were obtained. There is postoperative change in the medial malleolar region as well as in the distal  fibula with alignment in these areas anatomic. No acute fracture or dislocation. There is a small knee joint effusion. There is no abnormal periosteal reaction. There are no blastic or lytic bone lesions. No osteomyelitis appreciable. There are multiple foci of calcification in the vascular structures of the right lower extremity. There appears to be soft tissue edema.  IMPRESSION: No acute fracture or dislocation. No erosive change or bony destruction. Small joint effusion in the knee region. Soft tissue edema. Multiple foci of arterial vascular calcification, most likely secondary to atherosclerotic change.   Electronically Signed   By: Bretta BangWilliam  Woodruff III M.D.   On: 09/03/2014 10:07   [place] Arterial Seg Single  09/03/2014   CLINICAL DATA:  Chronic nonhealing right lower extremity wound. History of left below-knee amputation.  EXAM: NONINVASIVE PHYSIOLOGIC VASCULAR STUDY OF RIGHT LOWER EXTREMITY  TECHNIQUE: Evaluation of the right lower extremity was performed at rest.  COMPARISON:  06/16/2010  FINDINGS: Despite multiple attempts, there was inability to obtain a waveform or pressure at the level of the dorsalis pedis artery or posterior tibial artery. The ankle-brachial index therefore could not be calculated. No Doppler signal was obtained.  IMPRESSION: Lack of Doppler signal at the level of the right dorsalis pedis and posterior tibial arteries. Ankle-brachial index could not be calculated.   Electronically Signed   By: Irish LackGlenn  Yamagata M.D.   On: 09/03/2014 14:54   Dg Chest Port 1 View  09/10/2014   CLINICAL DATA:  Fever  EXAM: PORTABLE CHEST - 1 VIEW  COMPARISON:  09/03/2014  FINDINGS: Heart is borderline in size. Mild tortuosity of the thoracic aorta again noted, stable. No confluent airspace opacities or effusions. No acute bony abnormality.  IMPRESSION: No active disease.   Electronically Signed   By: Charlett NoseKevin  Dover M.D.   On: 09/10/2014 15:01    Microbiology: Recent Results (from the past 240 hour(s))   Wound culture     Status: None   Collection Time: 09/03/14  9:30 AM  Result Value Ref Range Status   Specimen Description WOUND RIGHT LEG  Final   Special Requests NONE  Final   Gram Stain   Final    NO WBC SEEN RARE SQUAMOUS EPITHELIAL CELLS PRESENT ABUNDANT GRAM POSITIVE COCCI IN PAIRS MODERATE GRAM NEGATIVE RODS RARE GRAM POSITIVE RODS    Culture   Final    MODERATE STAPHYLOCOCCUS AUREUS Note: RIFAMPIN AND GENTAMICIN SHOULD NOT BE USED AS SINGLE DRUGS FOR TREATMENT OF STAPH INFECTIONS. Performed at [facility]    Report Status 09/07/2014 FINAL  Final   Organism ID, Bacteria STAPHYLOCOCCUS AUREUS  Final      Susceptibility   Staphylococcus aureus - MIC*    CLINDAMYCIN <=0.25 SENSITIVE Sensitive     ERYTHROMYCIN <=0.25 SENSITIVE Sensitive     GENTAMICIN <=0.5 SENSITIVE Sensitive     LEVOFLOXACIN 4 INTERMEDIATE Intermediate     OXACILLIN 0.5 SENSITIVE Sensitive     PENICILLIN 0.12 SENSITIVE Sensitive     RIFAMPIN <=0.5 SENSITIVE Sensitive     TRIMETH/SULFA <=10 SENSITIVE Sensitive     VANCOMYCIN 1 SENSITIVE Sensitive     TETRACYCLINE <=1 SENSITIVE Sensitive     MOXIFLOXACIN 1 INTERMEDIATE Intermediate     * MODERATE STAPHYLOCOCCUS AUREUS  Surgical pcr screen     Status: Abnormal   Collection Time: 09/06/14 11:00 AM  Result Value Ref Range Status   MRSA, PCR NEGATIVE NEGATIVE Final   Staphylococcus aureus POSITIVE (A) NEGATIVE Final    Comment:        The Xpert SA Assay (FDA approved for NASAL specimens in patients over 48 years of age), is one component of a comprehensive surveillance program.  Test performance has been validated by [facility] for patients greater than or equal to 35 year old. It is not intended to diagnose infection nor to guide or monitor treatment. RESULT CALLED TO, READ BACK BY AND VERIFIED WITH: FERGUSON,M. AT 1707 ON 09/06/2014 BY BAUGHAM,M.   Culture, blood (routine x 2)     Status: None (Preliminary result)   Collection Time:  09/10/14 10:00 AM  Result Value Ref Range Status   Specimen Description BLOOD RIGHT ANTECUBITAL  Final   Special Requests BOTTLES DRAWN AEROBIC AND ANAEROBIC 6cc  Final   Culture NO GROWTH <24 HRS  Final   Report Status PENDING  Incomplete  Culture, blood (routine x 2)     Status: None (Preliminary result)   Collection Time: 09/10/14 10:13 AM  Result Value Ref Range Status   Specimen Description BLOOD RIGHT ARM  Final   Special Requests BOTTLES DRAWN AEROBIC AND ANAEROBIC 8cc  Final   Culture NO GROWTH <24 HRS  Final   Report Status PENDING  Incomplete     Labs: Basic Metabolic Panel:  Recent Labs Lab 09/07/14 0543 09/08/14 0706 09/09/14 0630 09/10/14 0544 09/11/14 0535  NA 136 137 137 139 140  K 3.8 4.4 4.2 3.9 3.8  CL 100 101 101 103 105  CO2 GLUCOSE 137* 129* 122* 115* 117*  BUN 34* 30* 33* 37* 40*  CREATININE 2.11* 2.14* 2.36* 2.34* 2.12*  CALCIUM 8.7 8.6 8.6 8.5 8.4  MG  --  1.7  --   --   --   PHOS  --  4.6  --   --   --    Liver Function Tests: No results for input(s): AST, ALT, ALKPHOS, BILITOT, PROT, ALBUMIN in the last 168 hours. No results for input(s): LIPASE, AMYLASE in the last 168 hours. No results for input(s): AMMONIA in the last 168 hours. CBC:  Recent Labs Lab 09/07/14 0543 09/08/14 0706 09/09/14 0630 09/10/14 0544 09/11/14 0535  WBC 16.2* 15.6* 15.5* 14.6* 11.7*  HGB 10.3* 9.5* 9.5* 9.2* 8.4*  HCT 31.4* 29.8* 30.0* 29.2* 27.3*  MCV 69.3* 70.0* 70.9* 71.2* 71.5*  PLT 380 398 390 398 430*   Cardiac Enzymes: No results for input(s): CKTOTAL, CKMB, CKMBINDEX, TROPONINI in the last 168 hours. BNP: BNP (last 3 results) No results for input(s): BNP in the last 8760 hours.  ProBNP (last 3 results) No results for input(s): PROBNP in the last 8760 hours.  CBG:  Recent Labs Lab 09/10/14 0753 09/10/14 1155 09/10/14 1632 09/10/14 2128 09/11/14 0738  GLUCAP 119* 117* 149* 124* 108*       Signed:  Demond Shallenberger  M  [facility] 09/11/2014, 9:42 AM

## 2014-09-11 NOTE — Care Management Utilization Note (Signed)
UR completed 

## 2014-09-11 NOTE — Progress Notes (Signed)
[facility]  IV removed pt tolerated well.  Will continue to monitor pt until transport arrives to take pt to facility.

## 2014-09-11 NOTE — Clinical Social Work Placement (Signed)
Clinical Social Work Department CLINICAL SOCIAL WORK PLACEMENT NOTE 09/11/2014  Patient:  Steven [name],Steven [name]  Account Number:  [id number] Admit date:  09/03/2014  Clinical Social Worker:  Derenda FennelKARA Lorie Melichar, LCSW  Date/time:  09/05/2014 09:42 AM  Clinical Social Work is seeking post-discharge placement for this patient at the following level of care:   SKILLED NURSING   (*CSW will update this form in Epic as items are completed)   09/05/2014  Patient/family provided with [facility] Department of Clinical Social Work's list of facilities offering this level of care within the geographic area requested by the patient (or if unable, by the patient's family).  09/05/2014  Patient/family informed of their freedom to choose among providers that offer the needed level of care, that participate in Medicare, Medicaid or managed care program needed by the patient, have an available bed and are willing to accept the patient.  09/05/2014  Patient/family informed of [facility]' ownership interest in [facility], as well as of the fact that they are under no obligation to receive care at this facility.  PASARR submitted to EDS on  PASARR number received on   FL2 transmitted to all facilities in geographic area requested by pt/family on  09/06/2014 FL2 transmitted to all facilities within larger geographic area on   Patient informed that his/her managed care company has contracts with or will negotiate with  certain facilities, including the following:     Patient/family informed of bed offers received:  09/07/2014 Patient chooses bed at Other Physician recommends and patient chooses bed at    Patient to be transferred to Other on  09/11/2014 Patient to be transferred to facility by  Patient and family notified of transfer on 09/11/2014 Name of family member notified:  Pt notified daughter and does not need CSW to.  The following physician request were entered in Epic:   Additional  Comments: [facility] has existing pasarr. [facility]  [place] Lake[place]ara Quanell [place], [place] [phone]

## 2014-09-11 NOTE — Progress Notes (Signed)
Patient's fever curve has flattened. Did not have a fever spike over 100. Right lower extremity wound looks good. Patient will be going to [facility]. Staples should stay in for approximately 3 weeks. They can be removed and Steri-Strips applied either at the facility or in follow-up in my office.

## 2014-09-15 LAB — CULTURE, BLOOD (ROUTINE X 2)
Culture: NO GROWTH
Culture: NO GROWTH

## 2016-07-30 DEATH — deceased
